# Patient Record
Sex: Female | Born: 2000 | Race: White | Hispanic: No | Smoking: Never smoker
Health system: Southern US, Community
[De-identification: ages and names within clinical notes are randomized; demographics above are authoritative.]

## PROBLEM LIST (undated history)

## (undated) DIAGNOSIS — N83209 Unspecified ovarian cyst, unspecified side: Secondary | ICD-10-CM

## (undated) DIAGNOSIS — E119 Type 2 diabetes mellitus without complications: Secondary | ICD-10-CM

## (undated) DIAGNOSIS — F909 Attention-deficit hyperactivity disorder, unspecified type: Secondary | ICD-10-CM

## (undated) DIAGNOSIS — L509 Urticaria, unspecified: Secondary | ICD-10-CM

## (undated) DIAGNOSIS — T7840XA Allergy, unspecified, initial encounter: Secondary | ICD-10-CM

## (undated) DIAGNOSIS — T783XXA Angioneurotic edema, initial encounter: Secondary | ICD-10-CM

## (undated) DIAGNOSIS — G43909 Migraine, unspecified, not intractable, without status migrainosus: Secondary | ICD-10-CM

## (undated) DIAGNOSIS — J45909 Unspecified asthma, uncomplicated: Secondary | ICD-10-CM

## (undated) HISTORY — DX: Unspecified asthma, uncomplicated: J45.909

## (undated) HISTORY — PX: TONSILLECTOMY: SUR1361

## (undated) HISTORY — DX: Allergy, unspecified, initial encounter: T78.40XA

## (undated) HISTORY — DX: Type 2 diabetes mellitus without complications: E11.9

## (undated) HISTORY — DX: Angioneurotic edema, initial encounter: T78.3XXA

## (undated) HISTORY — DX: Urticaria, unspecified: L50.9

---
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
## 2019-01-26 DIAGNOSIS — F952 Tourette's disorder: Secondary | ICD-10-CM | POA: Insufficient documentation

## 2021-04-26 ENCOUNTER — Other Ambulatory Visit: Payer: Self-pay

## 2021-04-26 ENCOUNTER — Encounter (HOSPITAL_BASED_OUTPATIENT_CLINIC_OR_DEPARTMENT_OTHER): Payer: Self-pay | Admitting: Emergency Medicine

## 2021-04-26 ENCOUNTER — Emergency Department (HOSPITAL_BASED_OUTPATIENT_CLINIC_OR_DEPARTMENT_OTHER): Payer: Medicaid Other

## 2021-04-26 ENCOUNTER — Emergency Department (HOSPITAL_BASED_OUTPATIENT_CLINIC_OR_DEPARTMENT_OTHER): Payer: Medicaid Other | Admitting: Radiology

## 2021-04-26 ENCOUNTER — Emergency Department (HOSPITAL_BASED_OUTPATIENT_CLINIC_OR_DEPARTMENT_OTHER)
Admission: EM | Admit: 2021-04-26 | Discharge: 2021-04-26 | Disposition: A | Discharge status: Home / Self Care | Payer: Medicaid Other | Attending: Emergency Medicine | Admitting: Emergency Medicine

## 2021-04-26 DIAGNOSIS — R109 Unspecified abdominal pain: Secondary | ICD-10-CM | POA: Diagnosis present

## 2021-04-26 DIAGNOSIS — R197 Diarrhea, unspecified: Secondary | ICD-10-CM | POA: Diagnosis not present

## 2021-04-26 DIAGNOSIS — Z20822 Contact with and (suspected) exposure to covid-19: Secondary | ICD-10-CM | POA: Diagnosis not present

## 2021-04-26 DIAGNOSIS — N83292 Other ovarian cyst, left side: Secondary | ICD-10-CM | POA: Insufficient documentation

## 2021-04-26 DIAGNOSIS — N83201 Unspecified ovarian cyst, right side: Secondary | ICD-10-CM

## 2021-04-26 DIAGNOSIS — R102 Pelvic and perineal pain: Secondary | ICD-10-CM

## 2021-04-26 LAB — CBC WITH DIFFERENTIAL/PLATELET
Abs Immature Granulocytes: 0.04 10*3/uL (ref 0.00–0.07)
Basophils Absolute: 0 10*3/uL (ref 0.0–0.1)
Basophils Relative: 0 %
Eosinophils Absolute: 0.3 10*3/uL (ref 0.0–0.5)
Eosinophils Relative: 2 %
HCT: 43.6 % (ref 36.0–46.0)
Hemoglobin: 14.6 g/dL (ref 12.0–15.0)
Immature Granulocytes: 0 %
Lymphocytes Relative: 22 %
Lymphs Abs: 3.6 10*3/uL (ref 0.7–4.0)
MCH: 30.2 pg (ref 26.0–34.0)
MCHC: 33.5 g/dL (ref 30.0–36.0)
MCV: 90.3 fL (ref 80.0–100.0)
Monocytes Absolute: 0.8 10*3/uL (ref 0.1–1.0)
Monocytes Relative: 5 %
Neutro Abs: 11.6 10*3/uL — ABNORMAL HIGH (ref 1.7–7.7)
Neutrophils Relative %: 71 %
Platelets: 412 10*3/uL — ABNORMAL HIGH (ref 150–400)
RBC: 4.83 MIL/uL (ref 3.87–5.11)
RDW: 12.3 % (ref 11.5–15.5)
WBC: 16.3 10*3/uL — ABNORMAL HIGH (ref 4.0–10.5)
nRBC: 0 % (ref 0.0–0.2)

## 2021-04-26 LAB — COMPREHENSIVE METABOLIC PANEL
ALT: 30 U/L (ref 0–44)
AST: 23 U/L (ref 15–41)
Albumin: 4.4 g/dL (ref 3.5–5.0)
Alkaline Phosphatase: 92 U/L (ref 38–126)
Anion gap: 9 (ref 5–15)
BUN: 8 mg/dL (ref 6–20)
CO2: 27 mmol/L (ref 22–32)
Calcium: 9.6 mg/dL (ref 8.9–10.3)
Chloride: 103 mmol/L (ref 98–111)
Creatinine, Ser: 0.46 mg/dL (ref 0.44–1.00)
GFR, Estimated: >60 mL/min (ref >60)
Glucose, Bld: 116 mg/dL — ABNORMAL HIGH (ref 70–99)
Potassium: 3.6 mmol/L (ref 3.5–5.1)
Sodium: 139 mmol/L (ref 135–145)
Total Bilirubin: 0.3 mg/dL (ref 0.3–1.2)
Total Protein: 7.3 g/dL (ref 6.5–8.1)

## 2021-04-26 LAB — URINALYSIS, ROUTINE W REFLEX MICROSCOPIC
Bilirubin Urine: NEGATIVE
Glucose, UA: NEGATIVE mg/dL
Hgb urine dipstick: NEGATIVE
Ketones, ur: NEGATIVE mg/dL
Leukocytes,Ua: NEGATIVE
Nitrite: NEGATIVE
Protein, ur: NEGATIVE mg/dL
Specific Gravity, Urine: 1.008 (ref 1.005–1.030)
pH: 7.5 (ref 5.0–8.0)

## 2021-04-26 LAB — TROPONIN I (HIGH SENSITIVITY)
Troponin I (High Sensitivity): <2 ng/L (ref <18)
Troponin I (High Sensitivity): <2 ng/L (ref <18)

## 2021-04-26 LAB — PREGNANCY, URINE: Preg Test, Ur: NEGATIVE

## 2021-04-26 LAB — LIPASE, BLOOD: Lipase: 24 U/L (ref 11–51)

## 2021-04-26 LAB — RESP PANEL BY RT-PCR (FLU A&B, COVID) ARPGX2
Influenza A by PCR: NEGATIVE
Influenza B by PCR: NEGATIVE
SARS Coronavirus 2 by RT PCR: NEGATIVE

## 2021-04-26 MED ORDER — IOHEXOL 300 MG/ML  SOLN
100.0000 mL | Freq: Once | INTRAMUSCULAR | Status: AC | PRN
  Administered 2021-04-26: 21:00:00 100 mL via INTRAVENOUS

## 2021-04-26 MED ORDER — IOHEXOL 300 MG/ML  SOLN: 100.0000 mL | Freq: Once | INTRAMUSCULAR | Status: DC | PRN

## 2021-04-26 NOTE — ED Notes (Signed)
Patient transported to Ultrasound 

## 2021-04-26 NOTE — Discharge Instructions (Addendum)
As we discussed your work-up today was reassuring including overall normal lab work with a slightly elevated white blood cell count which can have multiple causes as we discussed including stress, infection.  The ultrasound showed a 4 and half centimeter right ovarian benign functional cyst.  They did not recommend any follow-up imaging.  However as we discussed based on the acute pain that you had earlier I have some concern that you may have some intermittent torsion happening and I do recommend that you follow-up with OB/GYN for further evaluation and close monitoring.  Please return to the emergency department if you experience worsening pain, nausea, vomiting, or excessive vaginal bleeding especially when you are not on your menstrual cycle.   It was a pleasure taking care of you today, I hope that you feel better soon.

## 2021-04-26 NOTE — ED Notes (Signed)
Patient back from CT.

## 2021-04-26 NOTE — ED Notes (Signed)
Patient transported to Radiology at this Time. 

## 2021-04-26 NOTE — ED Notes (Signed)
EDP at Bedside 

## 2021-04-26 NOTE — ED Provider Notes (Signed)
[facility]   CSN: [id number] Arrival date & time: 04/26/21  1623     History Chief Complaint  Patient presents with   Diarrhea    Alyssa Bennett is a 20 y.o. female with past medical history significant for anxiety, unspecified heart arrhythmia with tachycardia evaluated by cardiologist in the past, panic attacks who presents with diarrhea, hot and cold flashes, abdominal pain, some congestion.  Patient reports the pain in her abdomen is 6/10, comes and goes.  Patient denies dysuria, hematuria, does endorse some increased urinary frequency.  Patient denies any vaginal discharge, concern for STI.  Patient denies concern for pregnancy, is in a same-sex relationship.  Patient reports last menstrual cycle was normal, within the last 2 weeks, no intermittent bleeding, normal cramping.  Patient does endorse decreased appetite.  Patient also reports that today she had some significant chest tightness, and chest pain without radiation to the left arm, neck.  Patient denies any diaphoresis, lightheadedness.  Patient reports that this sensation is consistent with former panic attacks.  Patient does also endorse asthma, however she has not noted any wheezing, has not had to use her inhaler today.  Pain the abdomen is characterizes sharp.  Nothing seems to make it better, nothing seems to make it worse.   Diarrhea Associated symptoms: abdominal pain       Past Medical History:  Diagnosis Date   ADHD (attention deficit hyperactivity disorder)    Migraine     Patient Active Problem List   Diagnosis Date Noted   Paraspinal muscle spasm 06/02/2017   Back pain, lumbosacral 06/02/2017    Past Surgical History:  Procedure Laterality Date   TONSILLECTOMY       OB History   No obstetric history on file.     Family History  Problem Relation Age of Onset   Migraines Mother    ADD / ADHD Mother    Anxiety disorder Mother    ADD / ADHD Brother     Migraines Maternal Grandmother    Anxiety disorder Maternal Grandmother    Seizures Neg Hx    Autism Neg Hx    Depression Neg Hx    Bipolar disorder Neg Hx    Schizophrenia Neg Hx     Social History   Tobacco Use   Smoking status: Never   Smokeless tobacco: Never  Substance Use Topics   Alcohol use: No   Drug use: No    Home Medications Prior to Admission medications   Medication Sig Start Date End Date Taking? Authorizing Provider  cetirizine-pseudoephedrine (ZYRTEC-D) 5-120 MG tablet Take 1 tablet by mouth daily. 10/30/17   Wurst, GrenadaBrittany, PA-C  ciprofloxacin-dexamethasone (CIPRODEX) OTIC suspension Place 4 drops into the right ear 2 (two) times daily. Patient not taking: Reported on 06/02/2017 12/12/16   Ofilia Neaslark, Michael L, PA-C  ibuprofen (ADVIL,MOTRIN) 600 MG tablet TAKE 1 TABLET BY MOUTH EVERY 6 HOURS AS NEEDED UP TO 10 DAYS 04/13/17   [provider]  lisdexamfetamine (VYVANSE) 30 MG capsule Take 30 mg by mouth daily.    [provider]  methylphenidate (RITALIN) 10 MG tablet Take 10 mg by mouth daily as needed (ADD).  03/29/15   [provider]  metoprolol tartrate (LOPRESSOR) 50 MG tablet Take by mouth. 04/01/16   [provider]  Multiple Vitamins-Minerals (MULTIVITAMIN ADULT PO) Take 2 each by mouth daily. gummies    [provider]  SUMAtriptan (IMITREX) 100 MG tablet TAKE ONE TABLET AT EARLIEST  SIGN OF MIGRAINE HEADACHE. 11/26/15   [provider]    Allergies    Zithromax [azithromycin]  Review of Systems   Review of Systems  Respiratory:  Positive for chest tightness and shortness of breath.   Cardiovascular:  Negative for chest pain.  Gastrointestinal:  Positive for abdominal pain and diarrhea.  All other systems reviewed and are negative.  Physical Exam Updated Vital Signs BP 132/81   Pulse 98   Temp 98.3 F (36.8 C) (Oral)   Resp 14   Ht 5' 0.5" (1.537 m)   Wt 61.2 kg   LMP 03/30/2021 (Approximate)  Comment: irregular; Negative Pregnancy test done 04/26/21  SpO2 100%   BMI 25.92 kg/m   Physical Exam Vitals and nursing note reviewed.  Constitutional:      General: She is not in acute distress.    Appearance: Normal appearance.     Comments: Overall well-appearing patient who is very anxious about being evaluated today  HENT:     Head: Normocephalic and atraumatic.  Eyes:     General:        Right eye: No discharge.        Left eye: No discharge.  Cardiovascular:     Rate and Rhythm: Normal rate and regular rhythm.     Heart sounds: No murmur heard.   No friction rub. No gallop.  Pulmonary:     Effort: Pulmonary effort is normal.     Breath sounds: Normal breath sounds.  Abdominal:     General: Bowel sounds are normal.     Palpations: Abdomen is soft.     Comments: Focal tenderness to palpation in the right lower quadrant without any masses palpated.  There is no rebound, rigidity, guarding noted.  There is no Murphy's sign.  Normal bowel sounds throughout.  Skin:    General: Skin is warm and dry.     Capillary Refill: Capillary refill takes less than 2 seconds.  Neurological:     Mental Status: She is alert and oriented to person, place, and time.  Psychiatric:        Mood and Affect: Mood normal.        Behavior: Behavior normal.    ED Results / Procedures / Treatments   Labs (all labs ordered are listed, but only abnormal results are displayed) Labs Reviewed  CBC WITH DIFFERENTIAL/PLATELET - Abnormal; Notable for the following components:      Result Value   WBC 16.3 (*)    Platelets 412 (*)    Neutro Abs 11.6 (*)    All other components within normal limits  COMPREHENSIVE METABOLIC PANEL - Abnormal; Notable for the following components:   Glucose, Bld 116 (*)    All other components within normal limits  URINALYSIS, ROUTINE W REFLEX MICROSCOPIC - Abnormal; Notable for the following components:   Color, Urine COLORLESS (*)    All other components within  normal limits  RESP PANEL BY RT-PCR (FLU A&B, COVID) ARPGX2  URINE CULTURE  PREGNANCY, URINE  LIPASE, BLOOD  TROPONIN I (HIGH SENSITIVITY)  TROPONIN I (HIGH SENSITIVITY)    EKG None  Radiology DG Chest 2 View  Result Date: 04/26/2021 CLINICAL DATA:  Chest pain and diarrhea today.  Anxiety. EXAM: CHEST - 2 VIEW COMPARISON:  04/23/2007 FINDINGS: Heart size is normal. Mediastinal shadows are normal. The lungs are clear. No bronchial thickening. No infiltrate, mass, effusion or collapse. Pulmonary vascularity is normal. No bony abnormality. IMPRESSION: Normal chest Electronically Signed   By:  Nelson Chimes M.D.   On: 04/26/2021 20:15   CT ABDOMEN PELVIS W CONTRAST  Result Date: 04/26/2021 CLINICAL DATA:  Right lower quadrant pain. EXAM: CT ABDOMEN AND PELVIS WITH CONTRAST TECHNIQUE: Multidetector CT imaging of the abdomen and pelvis was performed using the standard protocol following bolus administration of intravenous contrast. CONTRAST:  150mL OMNIPAQUE IOHEXOL 300 MG/ML  SOLN COMPARISON:  None. FINDINGS: Lower chest: No acute abnormality. Hepatobiliary: 20 cm length moderately steatotic liver without mass enhancement. The gallbladder and bile ducts are unremarkable. Pancreas: Unremarkable. No pancreatic ductal dilatation or surrounding inflammatory changes. Spleen: No mass enhancement or splenomegaly. Adrenals/Urinary Tract: Adrenal glands are unremarkable. Kidneys are normal, without renal calculi, focal lesion, or hydronephrosis. Bladder is unremarkable. Stomach/Bowel: The gastric wall, unopacified small bowel and appendix are unremarkable. There is mild fecal stasis. Left colonic scattered diverticula without diverticulitis. Vascular/Lymphatic: No significant vascular findings are present. No enlarged abdominal or pelvic lymph nodes. Reproductive: There is a 5.4 x 5.4 x 4.3 cm, likely cystic low-density lesion of the right ovary of 27 Hounsfield units above the usual density of simple fluid. It  is probably a hemorrhagic cyst. There is no edema adjacent the right ovary. The left ovary is unremarkable. The uterus is intact and unremarkable. Other: There is trace low-density fluid in the pelvic cul-de-sac and adnexal areas. There is no free air, abscess or hemorrhage. Small umbilical fat hernia. Musculoskeletal: No acute or significant osseous findings. IMPRESSION: 1. 5.4 x 5.4 x 4.3 cm right ovarian low-density lesion above the typical density of fluid, most likely is a hemorrhagic cyst. Since there is no other etiology identified for right lower quadrant pain, pelvic ultrasound should be obtained to confirm preservation of flow to the right ovary. 2. 20 cm in length moderately steatotic liver. No splenomegaly. No portal vein dilatation. 3. Scattered left colonic diverticula without diverticulitis findings. Constipation. 4. Trace low-density fluid in the pelvis, could be physiologic or due to cyst leakage. No free hemorrhage. 5. Umbilical fat hernia. Electronically Signed   By: Telford Nab M.D.   On: 04/26/2021 21:46   US PELVIC COMPLETE W TRANSVAGINAL AND TORSION R/O  Result Date: 04/26/2021 CLINICAL DATA:  Right lower quadrant pain for 2 days, initial encounter EXAM: TRANSABDOMINAL AND TRANSVAGINAL ULTRASOUND OF PELVIS DOPPLER ULTRASOUND OF OVARIES TECHNIQUE: Both transabdominal and transvaginal ultrasound examinations of the pelvis were performed. Transabdominal technique was performed for global imaging of the pelvis including uterus, ovaries, adnexal regions, and pelvic cul-de-sac. It was necessary to proceed with endovaginal exam following the transabdominal exam to visualize the ovaries. Color and duplex Doppler ultrasound was utilized to evaluate blood flow to the ovaries. COMPARISON:  CT from earlier in the same day. FINDINGS: Uterus Measurements: 6.1 x 3.1 x 4.1 cm. = volume: 45 mL. No fibroids or other mass visualized. Endometrium Thickness: 5 mm.  No focal abnormality visualized. Right  ovary Measurements: 5.2 x 5.1 x 4.9 cm. = volume: 68 mL. 4.5 cm simple appearing cyst is noted within the right ovary. This corresponds to that seen on recent CT examination. No mural nodularity is noted. No significant vascularity is noted. No septations are seen. Left ovary Measurements: 3.3 x 2.7 x 2.2 cm. = volume: 10.1 mL. Normal appearance/no adnexal mass. Pulsed Doppler evaluation of both ovaries demonstrates normal low-resistance arterial and venous waveforms. Other findings Mild amount of free pelvic fluid is noted likely physiologic in nature. IMPRESSION: 4.5 cm right ovarian benign functional cyst. No follow-up imaging is recommended. Reference: Radiology 2019 Nov;293(2):359-371  No other focal abnormality is noted. Electronically Signed   By: Alcide Clever M.D.   On: 04/26/2021 22:53    Procedures Procedures   Medications Ordered in ED Medications  iohexol (OMNIPAQUE) 300 MG/ML solution 100 mL (100 mLs Intravenous Contrast Given 04/26/21 2121)    ED Course  I have reviewed the triage vital signs and the nursing notes.  Pertinent labs & imaging results that were available during my care of the patient were reviewed by me and considered in my medical decision making (see chart for details).    MDM Rules/Calculators/A&P                         I discussed this case with my attending physician who cosigned this note including patient's presenting symptoms, physical exam, and planned diagnostics and interventions. Attending physician stated agreement with plan or made changes to plan which were implemented.   Attending physician assessed patient at bedside.  Overall well-appearing patient with story concerning for possible lower abdominal etiologies including ovarian torsion, appendicitis, diverticulitis, gastroenteritis, urinary tract infection, nephrolithiasis, pyelonephritis.  Patient with chest tightness, pain consistent with panic attack, however with some concern for cardiac  abnormality, with strong family history of cardiac disease.  Patient without history of former ACS, diabetes, does not smoke.  Given the large differential diagnosis for Alyssa Bennett, the decision making in this case is of high complexity.  After evaluating all of the data points in this case, the presentation of Alyssa Bennett is NOT consistent with Acute Coronary Syndrome (ACS) and/or myocardial ischemia, pulmonary embolism, aortic dissection; Alyssa Bennett, significant arrythmia, pneumothorax, cardiac tamponade, or other emergent cardiopulmonary condition.  Further, the presentation of Alyssa Bennett is NOT consistent with pericarditis, myocarditis, cholecystitis, pancreatitis, mediastinitis, endocarditis, new valvular disease.  Additionally, the presentation of Alyssa Bennett is NOT consistent with flail chest, cardiac contusion, ARDS, or significant intra-thoracic or intra-abdominal bleeding.  Moreover, this presentation is NOT consistent with pneumonia, sepsis, or pyelonephritis.  The patient has some chest tightness consistent with her normal panic attacks, improved with reassurance of normal work-up today including negative troponin x2, nonischemic EKG which does show consistency with her known arrhythmia, and normal chest x-ray.  Patient with no additional concern for cardiac, pulmonary chest pain at this time.  In regards to her abdominal pain patient does have a focal right lower quadrant tenderness.  Her initial screening lab work is significant only for a fairly elevated white count of 16.3, with some neutrophil predominance, slightly elevated platelets potentially representing acute phase reactant.  Minimally elevated glucose of 116, with no other abnormalities including electrolytes, clear urinalysis, negative lipase.  This patient does have some focal right lower quadrant tenderness I do recommend a CT abdomen pelvis at this time to rule out appendicitis especially in context of  elevated white blood cell count.  CT abdomen pelvis reveals a approximately 5 cm probably hemorrhagic cyst in the right lower quadrant with some free fluid potentially representing possible minimal rupture versus physiologic free fluid.  Recommendation on CT is an ultrasound to rule out torsion.  Ultrasound shows no evidence of torsion with good blood flow to both ovaries, and a normal physiologic cyst.  They recommended no further follow-up.  In context of patient's severe pain today that was resolved without any intervention pain medication I have some suspicion that patient may be having intermittent ovarian torsion.  I do recommend that she follow-up with an OB/GYN for further  evaluation.   Is pain-free at this time, with minimal focal tenderness in the right lower quadrant still.  Patient remained stable, stable vital signs.  Patient agrees with plan to follow-up with OB/GYN, and is otherwise reassured.  Patient discharged in stable condition at this time  Strict return and follow-up precautions have been given by me personally or by detailed written instruction given verbally by nursing staff using the teach back method to the patient/family/caregiver(s).  Data Reviewed/Counseling: I have reviewed the patient's vital signs, nursing notes, and other relevant tests/information. I had a detailed discussion regarding the historical points, exam findings, and any diagnostic results supporting the discharge diagnosis. I also discussed the need for outpatient follow-up and the need to return to the ED if symptoms worsen or if there are any questions or concerns that arise at home.  Final Clinical Impression(s) / ED Diagnoses Final diagnoses:  Adnexal pain  Cyst of right ovary    Rx / DC Orders ED Discharge Orders     None        Dorien Chihuahua 04/26/21 2304    Charlesetta Shanks, MD 05/01/21 1550

## 2021-04-26 NOTE — ED Notes (Signed)
RN provided AVS using Teachback Method. Patient verbalizes understanding of Discharge Instructions. Opportunity for Questioning and Answers were provided by RN. Patient Discharged from ED ambulatory to Home with Family. ? ?

## 2021-04-26 NOTE — ED Triage Notes (Signed)
Pt reports diarrhea along with hot and cold flashes starting today.  Pt denies cough, states congestion similar to normal allergies.

## 2021-04-28 LAB — URINE CULTURE: Culture: <10000 — AB

## 2021-10-14 ENCOUNTER — Other Ambulatory Visit: Payer: Self-pay

## 2021-10-14 ENCOUNTER — Encounter (HOSPITAL_BASED_OUTPATIENT_CLINIC_OR_DEPARTMENT_OTHER): Payer: Self-pay | Admitting: Radiology

## 2021-10-14 ENCOUNTER — Emergency Department (HOSPITAL_BASED_OUTPATIENT_CLINIC_OR_DEPARTMENT_OTHER): Payer: Medicaid Other

## 2021-10-14 ENCOUNTER — Emergency Department (HOSPITAL_BASED_OUTPATIENT_CLINIC_OR_DEPARTMENT_OTHER)
Admission: EM | Admit: 2021-10-14 | Discharge: 2021-10-14 | Disposition: A | Discharge status: Home / Self Care | Payer: Medicaid Other | Attending: Emergency Medicine | Admitting: Emergency Medicine

## 2021-10-14 DIAGNOSIS — R739 Hyperglycemia, unspecified: Secondary | ICD-10-CM | POA: Insufficient documentation

## 2021-10-14 DIAGNOSIS — R1031 Right lower quadrant pain: Secondary | ICD-10-CM

## 2021-10-14 DIAGNOSIS — N3001 Acute cystitis with hematuria: Secondary | ICD-10-CM | POA: Insufficient documentation

## 2021-10-14 DIAGNOSIS — K760 Fatty (change of) liver, not elsewhere classified: Secondary | ICD-10-CM | POA: Diagnosis not present

## 2021-10-14 DIAGNOSIS — D72829 Elevated white blood cell count, unspecified: Secondary | ICD-10-CM | POA: Diagnosis not present

## 2021-10-14 LAB — URINALYSIS, ROUTINE W REFLEX MICROSCOPIC
Bilirubin Urine: NEGATIVE
Glucose, UA: NEGATIVE mg/dL
Ketones, ur: NEGATIVE mg/dL
Nitrite: NEGATIVE
Protein, ur: 30 mg/dL — AB
Specific Gravity, Urine: 1.007 (ref 1.005–1.030)
WBC, UA: >50 WBC/hpf — ABNORMAL HIGH (ref 0–5)
pH: 6 (ref 5.0–8.0)

## 2021-10-14 LAB — COMPREHENSIVE METABOLIC PANEL
ALT: 21 U/L (ref 0–44)
AST: 16 U/L (ref 15–41)
Albumin: 4 g/dL (ref 3.5–5.0)
Alkaline Phosphatase: 76 U/L (ref 38–126)
Anion gap: 10 (ref 5–15)
BUN: 15 mg/dL (ref 6–20)
CO2: 26 mmol/L (ref 22–32)
Calcium: 9.2 mg/dL (ref 8.9–10.3)
Chloride: 103 mmol/L (ref 98–111)
Creatinine, Ser: 0.67 mg/dL (ref 0.44–1.00)
GFR, Estimated: >60 mL/min (ref >60)
Glucose, Bld: 107 mg/dL — ABNORMAL HIGH (ref 70–99)
Potassium: 3.7 mmol/L (ref 3.5–5.1)
Sodium: 139 mmol/L (ref 135–145)
Total Bilirubin: 0.3 mg/dL (ref 0.3–1.2)
Total Protein: 7.3 g/dL (ref 6.5–8.1)

## 2021-10-14 LAB — CBC WITH DIFFERENTIAL/PLATELET
Abs Immature Granulocytes: 0.05 10*3/uL (ref 0.00–0.07)
Basophils Absolute: 0.1 10*3/uL (ref 0.0–0.1)
Basophils Relative: 0 %
Eosinophils Absolute: 0.2 10*3/uL (ref 0.0–0.5)
Eosinophils Relative: 1 %
HCT: 40.6 % (ref 36.0–46.0)
Hemoglobin: 13.6 g/dL (ref 12.0–15.0)
Immature Granulocytes: 0 %
Lymphocytes Relative: 17 %
Lymphs Abs: 2.8 10*3/uL (ref 0.7–4.0)
MCH: 30.4 pg (ref 26.0–34.0)
MCHC: 33.5 g/dL (ref 30.0–36.0)
MCV: 90.6 fL (ref 80.0–100.0)
Monocytes Absolute: 0.9 10*3/uL (ref 0.1–1.0)
Monocytes Relative: 5 %
Neutro Abs: 12.6 10*3/uL — ABNORMAL HIGH (ref 1.7–7.7)
Neutrophils Relative %: 77 %
Platelets: 371 10*3/uL (ref 150–400)
RBC: 4.48 MIL/uL (ref 3.87–5.11)
RDW: 12.1 % (ref 11.5–15.5)
WBC: 16.6 10*3/uL — ABNORMAL HIGH (ref 4.0–10.5)
nRBC: 0 % (ref 0.0–0.2)

## 2021-10-14 LAB — WET PREP, GENITAL
Clue Cells Wet Prep HPF POC: NONE SEEN
Sperm: NONE SEEN
Trich, Wet Prep: NONE SEEN
WBC, Wet Prep HPF POC: >=10 — AB (ref <10)
Yeast Wet Prep HPF POC: NONE SEEN

## 2021-10-14 LAB — LIPASE, BLOOD: Lipase: 24 U/L (ref 11–51)

## 2021-10-14 LAB — HCG, QUANTITATIVE, PREGNANCY: hCG, Beta Chain, Quant, S: <1 m[IU]/mL (ref <5)

## 2021-10-14 MED ORDER — IBUPROFEN 600 MG PO TABS: 600.0000 mg | ORAL_TABLET | Freq: Four times a day (QID) | ORAL | 0 refills | Status: DC | PRN

## 2021-10-14 MED ORDER — NITROFURANTOIN MONOHYD MACRO 100 MG PO CAPS: 100.0000 mg | ORAL_CAPSULE | Freq: Two times a day (BID) | ORAL | 0 refills | Status: DC

## 2021-10-14 MED ORDER — IBUPROFEN 400 MG PO TABS
600.0000 mg | ORAL_TABLET | Freq: Once | ORAL | Status: AC
  Administered 2021-10-14: 600 mg via ORAL
  Filled 2021-10-14: qty 1

## 2021-10-14 MED ORDER — IOHEXOL 300 MG/ML  SOLN
100.0000 mL | Freq: Once | INTRAMUSCULAR | Status: AC | PRN
  Administered 2021-10-14: 75 mL via INTRAVENOUS

## 2021-10-14 MED ORDER — ONDANSETRON HCL 4 MG PO TABS: 4.0000 mg | ORAL_TABLET | Freq: Four times a day (QID) | ORAL | 0 refills | Status: DC

## 2021-10-14 MED ORDER — NITROFURANTOIN MONOHYD MACRO 100 MG PO CAPS
100.0000 mg | ORAL_CAPSULE | Freq: Once | ORAL | Status: AC
  Administered 2021-10-14: 100 mg via ORAL
  Filled 2021-10-14: qty 1

## 2021-10-14 MED ORDER — ONDANSETRON HCL 4 MG/2ML IJ SOLN
4.0000 mg | Freq: Once | INTRAMUSCULAR | Status: AC
  Administered 2021-10-14: 4 mg via INTRAVENOUS
  Filled 2021-10-14: qty 2

## 2021-10-14 NOTE — ED Triage Notes (Signed)
Pt arrived POV, c/o intermittent right sided lower abd pain x 5 months. Pt reports the she was diagnosed previously with "mild intermittent ovarian torsion". Also endorses history of dysmenorrhea and period started today. OB appt on 11/02/2021. Denies abdominal surgeries in the past.

## 2021-10-14 NOTE — Discharge Instructions (Addendum)
You were seen in the emergency department for evaluation of your right lower quadrant pain.  Your imaging was unremarkable.  Your labs suggested that you may have a urinary tract infection.  For this, I am prescribing you an antibiotic called Macrobid or nitrofurantoin.  You were given your first dose tonight.  Please continue taking this twice daily with your next dose being in the morning.  Additionally, I prescribed you ibuprofen 600 mg to take every 6 hours as needed for pain.  Additionally, I prescribed you Zofran to take as needed for your chronic nausea.  If you have any worsening abdominal pain, fever, blood in your urine, black stools, or any other new or worsening symptoms, or any concern, please return to the nearest emergency department for reevaluation.  Contact a doctor if: Your belly pain changes or gets worse. You are not hungry, or you lose weight without trying. You are having trouble pooping (constipated) or have watery poop (diarrhea) for more than 2-3 days. You have pain when you pee or poop. Your belly pain wakes you up at night. Your pain gets worse with meals, after eating, or with certain foods. You are vomiting and cannot keep anything down. You have a fever. You have blood in your pee. Get help right away if: Your pain does not go away as soon as your doctor says it should. You cannot stop vomiting. Your pain is only in areas of your belly, such as the right side or the left lower part of the belly. You have bloody or black poop, or poop that looks like tar. You have very bad pain, cramping, or bloating in your belly. You have signs of not having enough fluid or water in your body (dehydration), such as: Dark pee, very little pee, or no pee. Cracked lips. Dry mouth. Sunken eyes. Sleepiness. Weakness. You have trouble breathing or chest pain.

## 2021-10-14 NOTE — ED Notes (Signed)
Lab notified of urine culture add on 

## 2021-10-14 NOTE — ED Provider Notes (Signed)
[facility] EMERGENCY DEPT Provider Note   CSN: [id number] Arrival date & time: 10/14/21  1401     History Chief Complaint  Patient presents with   Abdominal Pain    Alyssa Bennett is a 21 y.o. female ADHD, and chronic urticaria area presents the emergency department for right lower quadrant pain for the past few months worsening over the past 2 to 3 days.  She reports that she was seen here late last year and was diagnosed with "intermittent ovarian torsion".  She was concerned it was worse again or her menstrual cramping as she did start her cycle today.  She denies any dysuria, hematuria, vaginal discharge, back pain, fever, chest pain, shortness of breath, melena, hematochezia, diarrhea, or constipation.  She reports that she has had some possible urinary frequency and urgency.  She reported that she had some nausea without any vomiting although the nausea is chronic for her.  She reported the pain was worse today and felt cramping on her right lower quadrant.  No medications trialed prior to arrival.  She is allergic to azithromycin.  Denies any tobacco, EtOH, or illicit drug use ever.  She reports that she has an appointment with a PCP in June to be sent to gynecologist for further evaluation.  Abdominal Pain Associated symptoms: nausea   Associated symptoms: no chest pain, no chills, no constipation, no diarrhea, no dysuria, no fever, no shortness of breath, no vaginal discharge and no vomiting       Home Medications Prior to Admission medications   Medication Sig Start Date End Date Taking? Authorizing Provider  ibuprofen (ADVIL) 600 MG tablet Take 1 tablet (600 mg total) by mouth every 6 (six) hours as needed. 10/14/21  Yes Sherrell Puller, PA-C  nitrofurantoin, macrocrystal-monohydrate, (MACROBID) 100 MG capsule Take 1 capsule (100 mg total) by mouth 2 (two) times daily. 10/14/21  Yes Sherrell Puller, PA-C  ondansetron (ZOFRAN) 4 MG tablet Take 1 tablet (4 mg total) by  mouth every 6 (six) hours. 10/14/21  Yes Sherrell Puller, PA-C  cetirizine-pseudoephedrine (ZYRTEC-D) 5-120 MG tablet Take 1 tablet by mouth daily. 10/30/17   Wurst, [place], PA-C  ciprofloxacin-dexamethasone (CIPRODEX) OTIC suspension Place 4 drops into the right ear 2 (two) times daily. Patient not taking: Reported on 06/02/2017 12/12/16   Tereasa Coop, PA-C  lisdexamfetamine (VYVANSE) 30 MG capsule Take 30 mg by mouth daily.    [provider]  methylphenidate (RITALIN) 10 MG tablet Take 10 mg by mouth daily as needed (ADD).  03/29/15   [provider]  metoprolol tartrate (LOPRESSOR) 50 MG tablet Take by mouth. 04/01/16   [provider]  Multiple Vitamins-Minerals (MULTIVITAMIN ADULT PO) Take 2 each by mouth daily. gummies    [provider]  SUMAtriptan (IMITREX) 100 MG tablet TAKE ONE TABLET AT EARLIEST SIGN OF MIGRAINE HEADACHE. 11/26/15   [provider]      Allergies    Zithromax [azithromycin]    Review of Systems   Review of Systems  Constitutional:  Negative for chills and fever.  Respiratory:  Negative for shortness of breath.   Cardiovascular:  Negative for chest pain.  Gastrointestinal:  Positive for abdominal pain and nausea. Negative for anal bleeding, blood in stool, constipation, diarrhea and vomiting.  Genitourinary:  Positive for frequency and urgency. Negative for dysuria, pelvic pain, vaginal discharge and vaginal pain.  Musculoskeletal:  Negative for back pain, myalgias and neck pain.  Neurological:  Negative for weakness and headaches.   Physical  Exam Updated Vital Signs BP 104/89   Pulse 84   Temp 98.9 F (37.2 C)   Resp 16   Ht 5\' 1"  (1.549 m)   Wt 61.2 kg   LMP 10/14/2021 (Exact Date)   SpO2 99%   BMI 25.49 kg/m  Physical Exam Vitals and nursing note reviewed. Exam conducted with a chaperone present Loma Sousa, Therapist, sports).  Constitutional:      General: She is not in acute distress.    Appearance: Normal appearance.  She is not ill-appearing or toxic-appearing.  HENT:     Head: Normocephalic and atraumatic.  Eyes:     General: No scleral icterus. Cardiovascular:     Rate and Rhythm: Normal rate and regular rhythm.  Pulmonary:     Effort: Pulmonary effort is normal. No respiratory distress.     Breath sounds: Normal breath sounds.  Abdominal:     General: Abdomen is flat. Bowel sounds are normal.     Palpations: Abdomen is soft.     Comments: Right lower quadrant tenderness palpation without any guarding or rebound.  No overlying skin changes noted.  Normal active bowel sounds.  Abdomen is soft.  Genitourinary:    Vagina: Normal.     Cervix: No cervical motion tenderness.     Comments: Some bleeding from the cervical os present, patient is currently on her menstrual cycle.  Musculoskeletal:        General: No deformity.     Cervical back: Normal range of motion.  Skin:    General: Skin is warm and dry.  Neurological:     General: No focal deficit present.     Mental Status: She is alert. Mental status is at baseline.    ED Results / Procedures / Treatments   Labs (all labs ordered are listed, but only abnormal results are displayed) Labs Reviewed  WET PREP, GENITAL - Abnormal; Notable for the following components:      Result Value   WBC, Wet Prep HPF POC >=10 (*)    All other components within normal limits  URINE CULTURE - Abnormal; Notable for the following components:   Culture   (*)    Value: <10,000 COLONIES/mL INSIGNIFICANT GROWTH Performed at [facility], [address] [place], [place] [address]    All other components within normal limits  CBC WITH DIFFERENTIAL/PLATELET - Abnormal; Notable for the following components:   WBC 16.6 (*)    Neutro Abs 12.6 (*)    All other components within normal limits  COMPREHENSIVE METABOLIC PANEL - Abnormal; Notable for the following components:   Glucose, Bld 107 (*)    All other components within normal limits  URINALYSIS,  ROUTINE W REFLEX MICROSCOPIC - Abnormal; Notable for the following components:   Color, Urine BROWN (*)    APPearance HAZY (*)    Hgb urine dipstick LARGE (*)    Protein, ur 30 (*)    Leukocytes,Ua MODERATE (*)    WBC, UA >50 (*)    Bacteria, UA RARE (*)    Non Squamous Epithelial 0-5 (*)    All other components within normal limits  HCG, QUANTITATIVE, PREGNANCY  LIPASE, BLOOD  GC/CHLAMYDIA PROBE AMP () NOT AT [facility]    EKG None  Radiology CT ABDOMEN PELVIS W CONTRAST  Result Date: 10/14/2021 CLINICAL DATA:  Right lower quadrant abdominal pain intermittently for 5 months. EXAM: CT ABDOMEN AND PELVIS WITH CONTRAST TECHNIQUE: Multidetector CT imaging of the abdomen and pelvis was performed using the standard  protocol following bolus administration of intravenous contrast. RADIATION DOSE REDUCTION: This exam was performed according to the departmental dose-optimization program which includes automated exposure control, adjustment of the mA and/or kV according to patient size and/or use of iterative reconstruction technique. CONTRAST:  27mL OMNIPAQUE IOHEXOL 300 MG/ML  SOLN COMPARISON:  04/26/2021 FINDINGS: Lower chest: Lung bases are clear. Hepatobiliary: No focal liver abnormality is seen. No gallstones, gallbladder wall thickening, or biliary dilatation. Mild diffuse fatty infiltration of the liver. Pancreas: Unremarkable. No pancreatic ductal dilatation or surrounding inflammatory changes. Spleen: Normal in size without focal abnormality. Adrenals/Urinary Tract: Adrenal glands are unremarkable. Kidneys are normal, without renal calculi, focal lesion, or hydronephrosis. Bladder is unremarkable. Stomach/Bowel: Stomach is within normal limits. Appendix appears normal. No evidence of bowel wall thickening, distention, or inflammatory changes. Vascular/Lymphatic: No significant vascular findings are present. No enlarged abdominal or pelvic lymph nodes. Reproductive: Uterus and bilateral  adnexa are unremarkable. Other: No abdominal wall hernia or abnormality. No abdominopelvic ascites. Musculoskeletal: No acute or significant osseous findings. IMPRESSION: 1. Mild diffuse fatty infiltration of the liver. 2. No acute process demonstrated in the abdomen or pelvis. No evidence of bowel obstruction or inflammation. Appendix is normal. Electronically Signed   By: Lucienne Capers M.D.   On: 10/14/2021 17:38   US PELVIC COMPLETE W TRANSVAGINAL AND TORSION R/O  Result Date: 10/14/2021 CLINICAL DATA:  Right lower quadrant pain. EXAM: TRANSABDOMINAL AND TRANSVAGINAL ULTRASOUND OF PELVIS DOPPLER ULTRASOUND OF OVARIES TECHNIQUE: Both transabdominal and transvaginal ultrasound examinations of the pelvis were performed. Transabdominal technique was performed for global imaging of the pelvis including uterus, ovaries, adnexal regions, and pelvic cul-de-sac. It was necessary to proceed with endovaginal exam following the transabdominal exam to visualize the uterus, endometrium, bilateral adnexa and bilateral ovaries. Color and duplex Doppler ultrasound was utilized to evaluate blood flow to the ovaries. COMPARISON:  April 26, 2021 FINDINGS: Uterus Measurements: 7.0 cm x 3.3 cm x 4.3 cm = volume: 51.6 mL. No fibroids or other mass visualized. Endometrium Thickness: 2.9 mm.  No focal abnormality visualized. Right ovary Measurements: 3.3 cm x 1.7 cm x 3.1 cm = volume: 9.3 mL. Normal appearance/no adnexal mass. Left ovary Measurements: 3.3 cm x 2.9 cm x 2.8 cm = volume: 14.5 mL. Normal appearance/no adnexal mass. Pulsed Doppler evaluation of both ovaries demonstrates normal low-resistance arterial and venous waveforms. Other findings A trace amount of pelvic free fluid is noted, likely physiologic. IMPRESSION: Unremarkable pelvic ultrasound. Electronically Signed   By: Virgina Norfolk M.D.   On: 10/14/2021 20:49     Procedures Procedures   Medications Ordered in ED Medications  ondansetron (ZOFRAN)  injection 4 mg (4 mg Intravenous Given 10/14/21 1656)  iohexol (OMNIPAQUE) 300 MG/ML solution 100 mL (75 mLs Intravenous Contrast Given 10/14/21 1725)  nitrofurantoin (macrocrystal-monohydrate) (MACROBID) capsule 100 mg (100 mg Oral Given 10/14/21 2153)  ibuprofen (ADVIL) tablet 600 mg (600 mg Oral Given 10/14/21 2152)    ED Course/ Medical Decision Making/ A&P                           Medical Decision Making Amount and/or Complexity of Data Reviewed Labs: ordered. Radiology: ordered.  Risk Prescription drug management.   21 year old female presents emergency department for evaluation of right lower quadrant pain that is been intermittent for the past few months although worsening for the past 2 to 3 days.  Differential diagnosis includes but is not limited to appendicitis, ovarian torsion, ovarian cyst, UTI,  diverticulitis, colitis, gas, viral illness, constipation, PID.  Vital signs are unremarkable.  Patient normotensive, afebrile, normal pulse rate, satting well on room air without any increased work of breathing.  Physical exam is pertinent for a well-appearing patient in no acute distress.  Regular rate and rhythm, clear to auscultation bilaterally.  Her abdomen is soft with mild tenderness over the right lower quadrant to deep palpation.  She has normal active bowel sounds with no overlying skin changes noted.  Vaginal exam showed some mild bleeding noted from the cervical os although patient is on her menstrual cycle.  No CMT tenderness.  No lesions or swelling noted.  Patient denies anything for pain at this time, but would like something for nausea.  Zofran ordered.  On chart investigation, the patient was seen here in December 2022.  Her ultrasound then showed a 4.5 cm right ovarian benign functional cyst with no follow-up imaging recommended.  The CT scan at that time showed possible hemorrhagic cyst in the right lower quadrant with some free fluid potentially representing a possible  minimal rupture versus physiological free fluid.  The patient had resolution of pain without any intervention so it was question if she had intermittent ovarian torsion.  Cannot rule out appendicitis so will order CT abdomen pelvis with contrast.  I independently reviewed and interpreted the patient's labs.  CMP shows mildly elevated glucose at 107 although this is not a fasting lab.  No LFT or electrolyte derangement otherwise.  CBC shows elevated white blood cell count at 16.6 but left shifted.  Normal hemoglobin.  Urinalysis shows brown urine with that is hazy with large amount of hemoglobin and moderate leukocytes.  There is some protein without any ketones.  She has greater than 50 white blood cells with rare bacteria seen and white blood cell clumps are present.  Lipase is 24, hCG negative, wet prep shows greater than 10 white blood cells without any yeast, clue cells, trichomonas, or sperm visualized.  GC pending.  Urine culture pending.  CT abdomen pelvis with contrast shows Mild diffuse fatty infiltration of the liver. No acute process demonstrated in the abdomen or pelvis. No evidence of bowel obstruction or inflammation. Appendix is normal.  Given normal CT imaging, will obtain ultrasound to rule out any torsion as well.  Ultrasound shows an unremarkable pelvic ultrasound.  Doubt PID as the patient does not have any CMT tenderness and has a reassuring pelvic exam.  Doubt any appendicitis given reassuring imaging finding.  Doubt any ovarian torsion or ovarian cyst given normal ultrasounds.  Doubt any colitis or diverticulitis given CT.  UTI possible given the patient's white blood cells although rare bacteria seen.  Will empirically place patient on Macrobid for treatment.  Unsure of the patient's elevated white blood cell count at 16.6, could be acute phase reaction.  I have ruled out any acute abdominal process.  This possibly could be UTI versus menstrual cramps.  I discussed the lab and  imaging findings with the patient and partner at bedside.  We discussed possible treatment of UTI with Macrobid twice daily for the next 5 days.  Patiently, will prescribe the patient some Zofran for chronic nausea as well antimicrobial 6 oh milligrams to take as needed for pain.  We discussed strict return precautions and red flag symptoms.  Patient and partner verbalized understanding and agreed to plan.  The patient is stable and being discharged home in good condition.  I discussed this case with my attending physician who cosigned  this note including patient's presenting symptoms, physical exam, and planned diagnostics and interventions. Attending physician stated agreement with plan or made changes to plan which were implemented.   Final Clinical Impression(s) / ED Diagnoses Final diagnoses:  Acute cystitis with hematuria  RLQ abdominal pain    Rx / DC Orders ED Discharge Orders          Ordered    nitrofurantoin, macrocrystal-monohydrate, (MACROBID) 100 MG capsule  2 times daily        10/14/21 2132    ondansetron (ZOFRAN) 4 MG tablet  Every 6 hours        10/14/21 2132    ibuprofen (ADVIL) 600 MG tablet  Every 6 hours PRN        10/14/21 2132              Sherrell Puller, PA-C 10/17/21 2328    Tegeler, Gwenyth Allegra, MD 10/18/21 [phone]

## 2021-10-14 NOTE — ED Notes (Signed)
Pelvic cart at bedside. 

## 2021-10-15 LAB — GC/CHLAMYDIA PROBE AMP (~~LOC~~) NOT AT ARMC
Chlamydia: NEGATIVE
Comment: NEGATIVE
Comment: NORMAL
Neisseria Gonorrhea: NEGATIVE

## 2021-10-16 LAB — URINE CULTURE: Culture: <10000 — AB

## 2021-11-02 ENCOUNTER — Ambulatory Visit (HOSPITAL_BASED_OUTPATIENT_CLINIC_OR_DEPARTMENT_OTHER): Payer: Medicaid Other | Admitting: Nurse Practitioner

## 2021-11-02 ENCOUNTER — Encounter (HOSPITAL_BASED_OUTPATIENT_CLINIC_OR_DEPARTMENT_OTHER): Payer: Self-pay | Admitting: Nurse Practitioner

## 2021-11-02 VITALS — BP 128/88 | HR 81 | Ht 60.0 in | Wt 185.0 lb

## 2021-11-02 DIAGNOSIS — G4485 Primary stabbing headache: Secondary | ICD-10-CM | POA: Diagnosis not present

## 2021-11-02 DIAGNOSIS — F988 Other specified behavioral and emotional disorders with onset usually occurring in childhood and adolescence: Secondary | ICD-10-CM

## 2021-11-02 DIAGNOSIS — N926 Irregular menstruation, unspecified: Secondary | ICD-10-CM

## 2021-11-02 DIAGNOSIS — K219 Gastro-esophageal reflux disease without esophagitis: Secondary | ICD-10-CM

## 2021-11-02 DIAGNOSIS — R110 Nausea: Secondary | ICD-10-CM

## 2021-11-02 DIAGNOSIS — Z130 Encounter for screening for diseases of the blood and blood-forming organs and certain disorders involving the immune mechanism: Secondary | ICD-10-CM

## 2021-11-02 DIAGNOSIS — F411 Generalized anxiety disorder: Secondary | ICD-10-CM

## 2021-11-02 DIAGNOSIS — L503 Dermatographic urticaria: Secondary | ICD-10-CM

## 2021-11-02 DIAGNOSIS — Z13228 Encounter for screening for other metabolic disorders: Secondary | ICD-10-CM

## 2021-11-02 DIAGNOSIS — K5909 Other constipation: Secondary | ICD-10-CM

## 2021-11-02 DIAGNOSIS — Z1321 Encounter for screening for nutritional disorder: Secondary | ICD-10-CM

## 2021-11-02 DIAGNOSIS — Z1329 Encounter for screening for other suspected endocrine disorder: Secondary | ICD-10-CM | POA: Diagnosis not present

## 2021-11-02 DIAGNOSIS — F419 Anxiety disorder, unspecified: Secondary | ICD-10-CM

## 2021-11-02 DIAGNOSIS — E559 Vitamin D deficiency, unspecified: Secondary | ICD-10-CM

## 2021-11-02 DIAGNOSIS — F952 Tourette's disorder: Secondary | ICD-10-CM

## 2021-11-02 MED ORDER — LEVOCETIRIZINE DIHYDROCHLORIDE 5 MG PO TABS: 5.0000 mg | ORAL_TABLET | Freq: Every evening | ORAL | 5 refills | Status: DC

## 2021-11-02 MED ORDER — ONDANSETRON HCL 8 MG PO TABS
8.0000 mg | ORAL_TABLET | Freq: Three times a day (TID) | ORAL | 2 refills | Status: DC | PRN
Start: 2021-11-02 — End: 2023-02-08

## 2021-11-02 MED ORDER — RIZATRIPTAN BENZOATE 10 MG PO TABS: 10.0000 mg | ORAL_TABLET | ORAL | 3 refills | Status: DC | PRN

## 2021-11-02 MED ORDER — ESOMEPRAZOLE MAGNESIUM 40 MG PO CPDR: DELAYED_RELEASE_CAPSULE | ORAL | 3 refills | Status: DC

## 2021-11-02 NOTE — Progress Notes (Incomplete)
Orma Render, DNP, AGNP-c Primary Care & Sports Medicine [address]  [place], [place] [address] [phone]  New patient visit   Patient: Alyssa Bennett   DOB: 2000-08-15   21 y.o. Female  MRN: [id number] Visit Date: 11/02/2021  Patient Care Team: [place], [place] At as PCP - General (Family Medicine)  Today's Vitals   11/02/21 1032  BP: 128/88  Pulse: 81  SpO2: 96%  Weight: 185 lb (83.9 kg)  Height: 5' (1.524 m)   Body mass index is 36.13 kg/m.   Today's healthcare provider: Orma Render, NP   Chief Complaint  Patient presents with  . New Patient (Initial Visit)    Establish care. Went to ED regarding cyst on ovary. Interested PAP next visit.  Allergy is not working Discuss adhd meds   Subjective    Alyssa Bennett is a 21 y.o. female who presents today as a new patient to establish care.    Patient endorses the following concerns presently: Recent cyst - periods irregular - sometimes don't bleed, but "go through the motions" , sometimes bleed but "don't go through the motions" Always have been this way No severe acne, no abnormal hair growth  WBC very elevated on recent ED visit  Nausea daily Chronic constipation She did see GI when she was "really little" At it's worst 2-3 days having a bowel movement Seems a little better because she increased fiber Either constipation or diarrhea Has had a small amount of blood related to fissure Having cramping and pain   She also has acid reflux She tries to keep tums on her at all times  She also reports she has been feeling a little short of breath  Stabbing sensation in the back of the head- not migraine- happening frequently- times them when they happen- Panic attacks Suspected eating disorder ADHD- vyvanse 30mg  and ritalin 10mg - in the past- has been off since 21 years old- it's been worse Tics- worse when she is around other people- feel like a  sneeze and hiccup combined "everywhere"  Itching on hands and feet/ swelling with heat Can't tolerate cold for long  History reviewed and reveals the following: Past Medical History:  Diagnosis Date  . ADHD (attention deficit hyperactivity disorder)   . Migraine    Past Surgical History:  Procedure Laterality Date  . TONSILLECTOMY     Family Status  Relation Name Status  . Mother  Alive  . Father  Alive  . Sister 92 Alive  . Brother 21 Alive  . MGM  Alive  . MGF  Alive  . PGM  Alive  . PGF  Alive  . Neg Hx  (Not Specified)   Family History  Problem Relation Age of Onset  . Migraines Mother   . ADD / ADHD Mother   . Anxiety disorder Mother   . ADD / ADHD Brother   . Migraines Maternal Grandmother   . Anxiety disorder Maternal Grandmother   . Seizures Neg Hx   . Autism Neg Hx   . Depression Neg Hx   . Bipolar disorder Neg Hx   . Schizophrenia Neg Hx    Social History   Socioeconomic History  . Marital status: Single    Spouse name: Not on file  . Number of children: Not on file  . Years of education: Not on file  . Highest education level: Not on file  Occupational History  . Not on file  Tobacco Use  . Smoking status: Never  . Smokeless tobacco: Never  Substance and Sexual Activity  . Alcohol use: No  . Drug use: No  . Sexual activity: Never  Other Topics Concern  . 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 She enjoys music, science and hanging with friends and family   Social Determinants of Health   Financial Resource Strain: Not on file  Food Insecurity: Not on file  Transportation Needs: Not on file  Physical Activity: Not on file  Stress: Not on file  Social Connections: Not on file   Outpatient Medications Prior to Visit  Medication Sig  . cetirizine-pseudoephedrine (ZYRTEC-D) 5-120 MG tablet Take 1 tablet by mouth daily. (Patient not  taking: Reported on 11/02/2021)  . ciprofloxacin-dexamethasone (CIPRODEX) OTIC suspension Place 4 drops into the right ear 2 (two) times daily. (Patient not taking: Reported on 06/02/2017)  . ibuprofen (ADVIL) 600 MG tablet Take 1 tablet (600 mg total) by mouth every 6 (six) hours as needed. (Patient not taking: Reported on 11/02/2021)  . lisdexamfetamine (VYVANSE) 30 MG capsule Take 30 mg by mouth daily. (Patient not taking: Reported on 11/02/2021)  . methylphenidate (RITALIN) 10 MG tablet Take 10 mg by mouth daily as needed (ADD).  (Patient not taking: Reported on 11/02/2021)  . metoprolol tartrate (LOPRESSOR) 50 MG tablet Take by mouth. (Patient not taking: Reported on 11/02/2021)  . Multiple Vitamins-Minerals (MULTIVITAMIN ADULT PO) Take 2 each by mouth daily. gummies (Patient not taking: Reported on 11/02/2021)  . nitrofurantoin, macrocrystal-monohydrate, (MACROBID) 100 MG capsule Take 1 capsule (100 mg total) by mouth 2 (two) times daily. (Patient not taking: Reported on 11/02/2021)  . ondansetron (ZOFRAN) 4 MG tablet Take 1 tablet (4 mg total) by mouth every 6 (six) hours. (Patient not taking: Reported on 11/02/2021)  . SUMAtriptan (IMITREX) 100 MG tablet TAKE ONE TABLET AT EARLIEST SIGN OF MIGRAINE HEADACHE. (Patient not taking: Reported on 11/02/2021)   No facility-administered medications prior to visit.   Allergies  Allergen Reactions  . Zithromax [Azithromycin] Itching and Rash    There is no immunization history on file for this patient.  Review of Systems All review of systems negative except what is listed in the HPI   Objective    BP 128/88   Pulse 81   Ht 5' (1.524 m)   Wt 185 lb (83.9 kg)   LMP 10/14/2021 (Exact Date) Comment: neg preg test  SpO2 96%   BMI 36.13 kg/m  Physical Exam  No results found for any visits on 11/02/21.  Assessment & Plan      Problem List Items Addressed This Visit   None    No follow-ups on file.      Faylinn Schwenn, Coralee Pesa, NP, DNP,  AGNP-C [facility] at [place]

## 2021-11-02 NOTE — Patient Instructions (Signed)
Thank you for choosing [place] at [facility] for your Primary Care needs. I am excited for the opportunity to partner with you to meet your health care goals. It was a pleasure meeting you today!  Recommendations from today's visit: I have sent referrals for you to neurology, psychology, and psychiatry. You should hear from these offices in the next 2 weeks. If you do not hear from them directly, please let us know.  I will let you know what your labs show.  If you have any new symptoms or concerns, please let us know  Information on diet, exercise, and health maintenance recommendations are listed below. This is information to help you be sure you are on track for optimal health and monitoring.   Please look over this and let us know if you have any questions or if you have completed any of the health maintenance outside of [place] so that we can be sure your records are up to date.  ___________________________________________________________ About Me: I am an Adult-Geriatric Nurse Practitioner with a background in caring for patients for more than 20 years with a strong intensive care background. I provide primary care and sports medicine services to patients age 34 and older within this office. My education had a strong focus on caring for the older adult population, which I am passionate about. I am also the director of the APP Fellowship with [facility].   My desire is to provide you with the best service through preventive medicine and supportive care. I consider you a part of the medical team and value your input. I work diligently to ensure that you are heard and your needs are met in a safe and effective manner. I want you to feel comfortable with me as your provider and want you to know that your health concerns are important to me.  For your information, our office hours are: Monday, Tuesday, and Thursday 8:00 AM - 5:00 PM Wednesday and Friday 8:00 AM -  12:00 PM.   In my time away from the office I am teaching new APP's within the system and am unavailable, but my partner, Dr. Burnard Bunting is in the office for emergent needs.   If you have questions or concerns, please call our office at [phone] or send [place] a MyChart message and we will respond as quickly as possible.  ____________________________________________________________ MyChart:  For all urgent or time sensitive needs we ask that you please call the office to avoid delays. Our number is [phone]. MyChart is not constantly monitored and due to the large volume of messages a day, replies may take up to 72 business hours.  MyChart Policy: MyChart allows for you to see your visit notes, after visit summary, provider recommendations, lab and tests results, make an appointment, request refills, and contact your provider or the office for non-urgent questions or concerns. Providers are seeing patients during normal business hours and do not have built in time to review MyChart messages.  We ask that you allow a minimum of 3 business days for responses to [employer]. For this reason, please do not send urgent requests through [place]. Please call the office at [phone]. New and ongoing conditions may require a visit. We have virtual and in person visit available for your convenience.  Complex MyChart concerns may require a visit. Your provider may request you schedule a virtual or in person visit to ensure we are providing the best care possible. MyChart messages sent after 11:00  AM on Friday will not be received by the provider until Monday morning.    Lab and Test Results: You will receive your lab and test results on MyChart as soon as they are completed and results have been sent by the lab or testing facility. Due to this service, you will receive your results BEFORE your provider.  I review lab and tests results each morning prior to seeing patients. Some results require  collaboration with other providers to ensure you are receiving the most appropriate care. For this reason, we ask that you please allow a minimum of 3-5 business days from the time the ALL results have been received for your provider to receive and review lab and test results and contact you about these.  Most lab and test result comments from the provider will be sent through [place]. Your provider may recommend changes to the plan of care, follow-up visits, repeat testing, ask questions, or request an office visit to discuss these results. You may reply directly to this message or call the office at [phone] to provide information for the provider or set up an appointment. In some instances, you will be called with test results and recommendations. Please let us know if this is preferred and we will make note of this in your chart to provide this for you.    If you have not heard a response to your lab or test results in 5 business days from all results returning to [place], please call the office to let us know. We ask that you please avoid calling prior to this time unless there is an emergent concern. Due to high call volumes, this can delay the resulting process.  After Hours: For all non-emergency after hours needs, please call the office at [phone] and select the option to reach the on-call provider service. On-call services are shared between multiple [place] offices and therefore it will not be possible to speak directly with your provider. On-call providers may provide medical advice and recommendations, but are unable to provide refills for maintenance medications.  For all emergency or urgent medical needs after normal business hours, we recommend that you seek care at the closest Urgent Care or Emergency Department to ensure appropriate treatment in a timely manner.  [facility] has a 24 hour emergency room located on the ground floor for your convenience.    Urgent Concerns During the Business Day Providers are seeing patients from 8AM to [place] with a busy schedule and are most often not able to respond to non-urgent calls until the end of the day or the next business day. If you should have URGENT concerns during the day, please call and speak to the nurse or schedule a same day appointment so that we can address your concern without delay.   Thank you, again, for choosing me as your health care partner. I appreciate your trust and look forward to learning more about you.   Worthy Keeler, DNP, AGNP-c ___________________________________________________________  Health Maintenance Recommendations Screening Testing Mammogram Every 1 -2 years based on history and risk factors Starting at age 58 Pap Smear Ages 21-39 every 3 years Ages 25-65 every 5 years with HPV testing More frequent testing may be required based on results and history Colon Cancer Screening Every 1-10 years based on test performed, risk factors, and history Starting at age 27 Bone Density Screening Every 2-10 years based on history Starting at age 4 for women Recommendations for men differ based on medication  usage, history, and risk factors AAA Screening One time ultrasound Men 71-20 years old who have every smoked Lung Cancer Screening Low Dose Lung CT every 12 months Age 75-80 years with a 30 pack-year smoking history who still smoke or who have quit within the last 15 years  Screening Labs Routine  Labs: Complete Blood Count (CBC), Complete Metabolic Panel (CMP), Cholesterol (Lipid Panel) Every 6-12 months based on history and medications May be recommended more frequently based on current conditions or previous results Hemoglobin A1c Lab Every 3-12 months based on history and previous results Starting at age 22 or earlier with diagnosis of diabetes, high cholesterol, BMI >26, and/or risk factors Frequent monitoring for patients with diabetes to ensure blood  sugar control Thyroid Panel (TSH w/ T3 & T4) Every 6 months based on history, symptoms, and risk factors May be repeated more often if on medication HIV One time testing for all patients 26 and older May be repeated more frequently for patients with increased risk factors or exposure Hepatitis C One time testing for all patients 3 and older May be repeated more frequently for patients with increased risk factors or exposure Gonorrhea, Chlamydia Every 12 months for all sexually active persons 13-24 years Additional monitoring may be recommended for those who are considered high risk or who have symptoms PSA Men 68-70 years old with risk factors Additional screening may be recommended from age 40-69 based on risk factors, symptoms, and history  Vaccine Recommendations Tetanus Booster All adults every 10 years Flu Vaccine All patients 6 months and older every year COVID Vaccine All patients 12 years and older Initial dosing with booster May recommend additional booster based on age and health history HPV Vaccine 2 doses all patients age 55-26 Dosing may be considered for patients over 26 Shingles Vaccine (Shingrix) 2 doses all adults 58 years and older Pneumonia (Pneumovax 23) All adults 69 years and older May recommend earlier dosing based on health history Pneumonia (Prevnar 65) All adults 14 years and older Dosed 1 year after Pneumovax 23  Additional Screening, Testing, and Vaccinations may be recommended on an individualized basis based on family history, health history, risk factors, and/or exposure.  __________________________________________________________  Diet Recommendations for All Patients  I recommend that all patients maintain a diet low in saturated fats, carbohydrates, and cholesterol. While this can be challenging at first, it is not impossible and small changes can make big differences.  Things to try: Decreasing the amount of soda, sweet tea, and/or juice  to one or less per day and replace with water While water is always the first choice, if you do not like water you may consider adding a water additive without sugar to improve the taste other sugar free drinks Replace potatoes with a brightly colored vegetable at dinner Use healthy oils, such as canola oil or olive oil, instead of butter or hard margarine Limit your bread intake to two pieces or less a day Replace regular pasta with low carb pasta options Bake, broil, or grill foods instead of frying Monitor portion sizes  Eat smaller, more frequent meals throughout the day instead of large meals  An important thing to remember is, if you love foods that are not great for your health, you don't have to give them up completely. Instead, allow these foods to be a reward when you have done well. Allowing yourself to still have special treats every once in a while is a nice way to tell yourself thank you for working  hard to keep yourself healthy.   Also remember that every day is a new day. If you have a bad day and "fall off the wagon", you can still climb right back up and keep moving along on your journey!  We have resources available to help you!  Some websites that may be helpful include: www.http://carter.biz/  Www.[employer]Well[employer].com _____________________________________________________________  Activity Recommendations for All Patients  I recommend that all adults get at least 20 minutes of moderate physical activity that elevates your heart rate at least 5 days out of the week.  Some examples include: Walking or jogging at a pace that allows you to carry on a conversation Cycling (stationary bike or outdoors) Water aerobics Yoga Weight lifting Dancing If physical limitations prevent you from putting stress on your joints, exercise in a pool or seated in a chair are excellent options.  Do determine your MAXIMUM heart rate for activity: YOUR AGE - 220 = MAX HeartRate   Remember! Do not  push yourself too hard.  Start slowly and build up your pace, speed, weight, time in exercise, etc.  Allow your body to rest between exercise and get good sleep. You will need more water than normal when you are exerting yourself. Do not wait until you are thirsty to drink. Drink with a purpose of getting in at least 8, 8 ounce glasses of water a day plus more depending on how much you exercise and sweat.    If you begin to develop dizziness, chest pain, abdominal pain, jaw pain, shortness of breath, headache, vision changes, lightheadedness, or other concerning symptoms, stop the activity and allow your body to rest. If your symptoms are severe, seek emergency evaluation immediately. If your symptoms are concerning, but not severe, please let us know so that we can recommend further evaluation.

## 2021-11-03 LAB — THYROID PANEL WITH TSH
Free Thyroxine Index: 1.8 (ref 1.2–4.9)
T3 Uptake Ratio: 25 % (ref 24–39)
T4, Total: 7 ug/dL (ref 4.5–12.0)
TSH: 2.25 u[IU]/mL (ref 0.450–4.500)

## 2021-11-03 LAB — CBC WITH DIFFERENTIAL/PLATELET
Basophils Absolute: 0.1 10*3/uL (ref 0.0–0.2)
Basos: 1 %
EOS (ABSOLUTE): 0.2 10*3/uL (ref 0.0–0.4)
Eos: 3 %
Hematocrit: 44.9 % (ref 34.0–46.6)
Hemoglobin: 14.7 g/dL (ref 11.1–15.9)
Immature Grans (Abs): 0 10*3/uL (ref 0.0–0.1)
Immature Granulocytes: 0 %
Lymphocytes Absolute: 2.7 10*3/uL (ref 0.7–3.1)
Lymphs: 29 %
MCH: 30 pg (ref 26.6–33.0)
MCHC: 32.7 g/dL (ref 31.5–35.7)
MCV: 92 fL (ref 79–97)
Monocytes Absolute: 0.5 10*3/uL (ref 0.1–0.9)
Monocytes: 6 %
Neutrophils Absolute: 5.8 10*3/uL (ref 1.4–7.0)
Neutrophils: 61 %
Platelets: 360 10*3/uL (ref 150–450)
RBC: 4.9 x10E6/uL (ref 3.77–5.28)
RDW: 11.9 % (ref 11.7–15.4)
WBC: 9.3 10*3/uL (ref 3.4–10.8)

## 2021-11-03 LAB — COMPREHENSIVE METABOLIC PANEL
ALT: 24 IU/L (ref 0–32)
AST: 16 IU/L (ref 0–40)
Albumin/Globulin Ratio: 1.6 (ref 1.2–2.2)
Albumin: 4.3 g/dL (ref 3.9–5.0)
Alkaline Phosphatase: 89 IU/L (ref 44–121)
BUN/Creatinine Ratio: 25 — ABNORMAL HIGH (ref 9–23)
BUN: 13 mg/dL (ref 6–20)
Bilirubin Total: 0.2 mg/dL (ref 0.0–1.2)
CO2: 23 mmol/L (ref 20–29)
Calcium: 9.5 mg/dL (ref 8.7–10.2)
Chloride: 103 mmol/L (ref 96–106)
Creatinine, Ser: 0.51 mg/dL — ABNORMAL LOW (ref 0.57–1.00)
Globulin, Total: 2.7 g/dL (ref 1.5–4.5)
Glucose: 122 mg/dL — ABNORMAL HIGH (ref 70–99)
Potassium: 4.3 mmol/L (ref 3.5–5.2)
Sodium: 140 mmol/L (ref 134–144)
Total Protein: 7 g/dL (ref 6.0–8.5)
eGFR: 136 mL/min/{1.73_m2} (ref >59)

## 2021-11-03 LAB — LIPID PANEL
Chol/HDL Ratio: 3.8 ratio (ref 0.0–4.4)
Cholesterol, Total: 173 mg/dL (ref 100–199)
HDL: 46 mg/dL (ref >39)
LDL Chol Calc (NIH): 100 mg/dL — ABNORMAL HIGH (ref 0–99)
Triglycerides: 153 mg/dL — ABNORMAL HIGH (ref 0–149)
VLDL Cholesterol Cal: 27 mg/dL (ref 5–40)

## 2021-11-03 LAB — HEMOGLOBIN A1C
Est. average glucose Bld gHb Est-mCnc: 128 mg/dL
Hgb A1c MFr Bld: 6.1 % — ABNORMAL HIGH (ref 4.8–5.6)

## 2021-11-03 LAB — VITAMIN D 25 HYDROXY (VIT D DEFICIENCY, FRACTURES): Vit D, 25-Hydroxy: 14.7 ng/mL — ABNORMAL LOW (ref 30.0–100.0)

## 2021-11-03 MED ORDER — VITAMIN D (ERGOCALCIFEROL) 1.25 MG (50000 UNIT) PO CAPS: 50000.0000 [IU] | ORAL_CAPSULE | ORAL | 0 refills | Status: DC

## 2021-11-05 ENCOUNTER — Telehealth (HOSPITAL_BASED_OUTPATIENT_CLINIC_OR_DEPARTMENT_OTHER): Payer: Self-pay

## 2021-11-05 ENCOUNTER — Encounter (HOSPITAL_BASED_OUTPATIENT_CLINIC_OR_DEPARTMENT_OTHER): Payer: Self-pay | Admitting: Nurse Practitioner

## 2021-11-05 DIAGNOSIS — N946 Dysmenorrhea, unspecified: Secondary | ICD-10-CM

## 2021-11-05 NOTE — Telephone Encounter (Signed)
Patient called back and left voicemail in regard to her lab results. Patient viewed provider comments in MyChart. Provider sent prescription for Vit D. Please return call.

## 2021-11-06 MED ORDER — NORETHIN ACE-ETH ESTRAD-FE 1-20 MG-MCG PO TABS: 1.0000 | ORAL_TABLET | Freq: Every day | ORAL | 3 refills | Status: DC

## 2021-11-09 ENCOUNTER — Telehealth (HOSPITAL_BASED_OUTPATIENT_CLINIC_OR_DEPARTMENT_OTHER): Payer: Self-pay

## 2021-11-09 DIAGNOSIS — G4485 Primary stabbing headache: Secondary | ICD-10-CM

## 2021-11-09 DIAGNOSIS — L503 Dermatographic urticaria: Secondary | ICD-10-CM | POA: Insufficient documentation

## 2021-11-09 DIAGNOSIS — L509 Urticaria, unspecified: Secondary | ICD-10-CM

## 2021-11-09 DIAGNOSIS — K219 Gastro-esophageal reflux disease without esophagitis: Secondary | ICD-10-CM | POA: Insufficient documentation

## 2021-11-09 HISTORY — DX: Primary stabbing headache: G44.85

## 2021-11-09 HISTORY — DX: Urticaria, unspecified: L50.9

## 2021-11-09 NOTE — Telephone Encounter (Signed)
Received telephone message from [place] that patient had questions, I called patient she stated all questions had been answered.

## 2021-11-09 NOTE — Assessment & Plan Note (Signed)
History of ADHD previously managed with Vyvanse in the morning and Ritalin PRN in the afternoon. No medication for the past several years. She does endorse that symptoms are worsening. 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 Given the mutifactoral approach needed for safe control, I do feel that her symptoms would best be managed under the care of psychiatry at this time. Will also send referral for counseling as this would likely be helpful, as well. No alarm symptoms present at this time.

## 2021-11-09 NOTE — Assessment & Plan Note (Signed)
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 She does have several concerns with mental health that will likely require close observation and management as well as medications to help control. I do feel that given the complexity of her symptoms, management would best be supported with psychiatry. Will send referral today. Will also send referral for counseling to help with management of symptoms and improve coping mechanisms. No alarm symptoms present at this time.

## 2021-11-09 NOTE — Progress Notes (Signed)
Orma Render, DNP, AGNP-c [facility] [address]  [place], [place] [address] [phone]  New patient visit   Patient: Alyssa Bennett   DOB: November 11, 2000   21 y.o. Female  MRN: [id number] Visit Date: 11/02/2021  Patient Care Team: Oda Lansdowne, Coralee Pesa, NP as PCP - General (Nurse Practitioner)  Today's Vitals   11/02/21 1032  BP: 128/88  Pulse: 81  SpO2: 96%  Weight: 185 lb (83.9 kg)  Height: 5' (1.524 m)   Body mass index is 36.13 kg/m.   Today's healthcare provider: Orma Render, NP   Chief Complaint  Patient presents with   New Patient (Initial Visit)    Establish care. Went to ED regarding cyst on ovary. Interested PAP next visit.  Allergy is not working Discuss adhd meds   Subjective    Alyssa Bennett is a 21 y.o. female who presents today as a new patient to establish care.    Patient endorses the following concerns presently:  Ovarian cyst recently found on [place] Irregular periods sometimes don't bleed, but "go through the motions" , sometimes bleed but "don't go through the motions" Always have been this way No severe acne, no abnormal hair growth No known glucose intolerance   WBC very elevated on recent ED visit Would like to have this rechecked No signs of active infection   Nausea daily Chronic constipation She did see GI when she was "really little" At it's worst 2-3 days having a bowel movement Seems a little better because she increased fiber Either constipation or diarrhea Has had a small amount of blood related to fissure in the past, but no large amounts of blood or new bleeding Having cramping and pain    Acid Reflux She tries to keep tums on her at all times Sometimes she feels like she is taking these quite often She has had some shortness of breath, but not sure if this is GERD No CP palpitations, dizziness   Headache  Stabbing sensation in the back of the head- not  migraine happening frequently times them when they happen  Last a few minutes then resolve spontaneously Can come several times during an attack  Also endorses history of panic attacks She reports that she has a suspected eating disorder Hx of ADHD- vyvanse 68m and ritalin 148m in the past- has been off since [age]ears old- it's been worse since being off of medication Endorses Tics- worse when she is around other people- feel like a sneeze and hiccup combined "everywhere"  Endorses Itching on hands and feet/ swelling with heat- also has dermographia  Can't tolerate cold for long    History reviewed and reveals the following: Past Medical History:  Diagnosis Date   ADHD (attention deficit hyperactivity disorder)    Migraine    Past Surgical History:  Procedure Laterality Date   TONSILLECTOMY     Family Status  Relation Name Status   Mother  Alive   Father  Alive   Sister [id number]   Brother [id number]   MGM  Alive   MGF  Alive   PGM  Alive   PGF  Alive   Neg Hx  (Not Specified)   Family History  Problem Relation Age of Onset   Migraines Mother    ADD / ADHD Mother    Anxiety disorder Mother    ADD / ADHD Brother    Migraines Maternal Grandmother    Anxiety disorder  Maternal Grandmother    Seizures Neg Hx    Autism Neg Hx    Depression Neg Hx    Bipolar disorder Neg Hx    Schizophrenia Neg Hx    Social History   Socioeconomic History   Marital status: Single    Spouse name: Not on file   Number of children: Not on file   Years of education: Not on file   Highest education level: Not on file  Occupational History   Not on file  Tobacco Use   Smoking status: Never   Smokeless tobacco: Never  Substance and Sexual Activity   Alcohol use: No   Drug use: No   Sexual activity: Never  Other Topics Concern   Not on file  Social History Narrative   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 She  enjoys music, science and hanging with friends and family   Social Determinants of Health   Financial Resource Strain: Not on file  Food Insecurity: Not on file  Transportation Needs: Not on file  Physical Activity: Not on file  Stress: Not on file  Social Connections: Not on file   Outpatient Medications Prior to Visit  Medication Sig   [DISCONTINUED] cetirizine-pseudoephedrine (ZYRTEC-D) 5-120 MG tablet Take 1 tablet by mouth daily. (Patient not taking: Reported on 11/02/2021)   [DISCONTINUED] ciprofloxacin-dexamethasone (CIPRODEX) OTIC suspension Place 4 drops into the right ear 2 (two) times daily. (Patient not taking: Reported on 06/02/2017)   [DISCONTINUED] ibuprofen (ADVIL) 600 MG tablet Take 1 tablet (600 mg total) by mouth every 6 (six) hours as needed. (Patient not taking: Reported on 11/02/2021)   [DISCONTINUED] lisdexamfetamine (VYVANSE) 30 MG capsule Take 30 mg by mouth daily. (Patient not taking: Reported on 11/02/2021)   [DISCONTINUED] methylphenidate (RITALIN) 10 MG tablet Take 10 mg by mouth daily as needed (ADD).  (Patient not taking: Reported on 11/02/2021)   [DISCONTINUED] metoprolol tartrate (LOPRESSOR) 50 MG tablet Take by mouth. (Patient not taking: Reported on 11/02/2021)   [DISCONTINUED] Multiple Vitamins-Minerals (MULTIVITAMIN ADULT PO) Take 2 each by mouth daily. gummies (Patient not taking: Reported on 11/02/2021)   [DISCONTINUED] nitrofurantoin, macrocrystal-monohydrate, (MACROBID) 100 MG capsule Take 1 capsule (100 mg total) by mouth 2 (two) times daily. (Patient not taking: Reported on 11/02/2021)   [DISCONTINUED] ondansetron (ZOFRAN) 4 MG tablet Take 1 tablet (4 mg total) by mouth every 6 (six) hours. (Patient not taking: Reported on 11/02/2021)   [DISCONTINUED] SUMAtriptan (IMITREX) 100 MG tablet TAKE ONE TABLET AT EARLIEST SIGN OF MIGRAINE HEADACHE. (Patient not taking: Reported on 11/02/2021)   No facility-administered medications prior to visit.   Allergies   Allergen Reactions   Zithromax [Azithromycin] Itching and Rash   Immunization History  Administered Date(s) Administered   DTaP 02/12/2000, 03/21/2000, 05/03/2000, 03/21/2002, 02/04/2004   HPV Quadrivalent 04/12/2011, 10/19/2012   Hepatitis A, Ped/Adol-2 Dose 04/01/2005, 10/19/2005   Hepatitis B, ped/adol Dec 08, 1999, 01/01/2000, 08/04/2000   HiB (PRP-T) 02/12/2000, 03/21/2000, 05/03/2000, 03/21/2002   IPV 02/12/2000, 03/21/2000, 05/03/2000, 01/12/2001, 02/04/2004   Influenza Nasal 01/28/2006, 03/20/2008, 07/09/2010, 04/12/2011   Influenza Split 03/21/2002, 04/06/2006, 05/27/2016   Influenza,inj,Quad PF,6+ Mos 04/01/2017, 03/26/2020   MMR 01/12/2001, 02/04/2004   Meningococcal Conjugate 11/30/2010   Pneumococcal Conjugate PCV 7 03/16/2000, 05/03/2000, 08/04/2000, 03/21/2002   Tdap 11/30/2010   Varicella 01/12/2001, 01/16/2007    Review of Systems All review of systems negative except what is listed in the HPI   Objective  BP 128/88   Pulse 81   Ht 5' (1.524 m)   Wt 185 lb (83.9 kg)   LMP 10/14/2021 (Exact Date) Comment: neg preg test  SpO2 96%   BMI 36.13 kg/m  Physical Exam Vitals and nursing note reviewed.  Constitutional:      General: She is not in acute distress.    Appearance: Normal appearance.  Eyes:     Extraocular Movements: Extraocular movements intact.     Conjunctiva/sclera: Conjunctivae normal.     Pupils: Pupils are equal, round, and reactive to light.  Neck:     Vascular: No carotid bruit.  Cardiovascular:     Rate and Rhythm: Normal rate and regular rhythm.     Pulses: Normal pulses.     Heart sounds: Normal heart sounds. No murmur heard. Pulmonary:     Effort: Pulmonary effort is normal.     Breath sounds: Normal breath sounds. No wheezing.  Abdominal:     General: Bowel sounds are normal. There is no distension.     Palpations: Abdomen is soft.     Tenderness: There is abdominal tenderness in the epigastric area.  Musculoskeletal:         General: Normal range of motion.     Cervical back: Normal range of motion.     Right lower leg: No edema.     Left lower leg: No edema.  Skin:    General: Skin is warm and dry.     Capillary Refill: Capillary refill takes less than 2 seconds.  Neurological:     General: No focal deficit present.     Mental Status: She is alert and oriented to person, place, and time.  Psychiatric:        Mood and Affect: Mood normal.        Behavior: Behavior normal.        Thought Content: Thought content normal.        Judgment: Judgment normal.     Results for orders placed or performed in visit on 11/02/21  Comprehensive metabolic panel  Result Value Ref Range   Glucose 122 (H) 70 - 99 mg/dL   BUN 13 6 - 20 mg/dL   Creatinine, Ser 0.51 (L) 0.57 - 1.00 mg/dL   eGFR 136 >59 mL/min/1.73   BUN/Creatinine Ratio 25 (H) 9 - 23   Sodium 140 134 - 144 mmol/L   Potassium 4.3 3.5 - 5.2 mmol/L   Chloride 103 96 - 106 mmol/L   CO2 23 20 - 29 mmol/L   Calcium 9.5 8.7 - 10.2 mg/dL   Total Protein 7.0 6.0 - 8.5 g/dL   Albumin 4.3 3.9 - 5.0 g/dL   Globulin, Total 2.7 1.5 - 4.5 g/dL   Albumin/Globulin Ratio 1.6 1.2 - 2.2   Bilirubin Total 0.2 0.0 - 1.2 mg/dL   Alkaline Phosphatase 89 44 - 121 IU/L   AST 16 0 - 40 IU/L   ALT 24 0 - 32 IU/L  Hemoglobin A1c  Result Value Ref Range   Hgb A1c MFr Bld 6.1 (H) 4.8 - 5.6 %   Est. average glucose Bld gHb Est-mCnc 128 mg/dL  Lipid panel  Result Value Ref Range   Cholesterol, Total 173 100 - 199 mg/dL   Triglycerides 153 (H) 0 - 149 mg/dL   HDL 46 >39 mg/dL   VLDL Cholesterol Cal 27 5 - 40 mg/dL   LDL Chol Calc (NIH) 100 (H) 0 - 99 mg/dL   Chol/HDL Ratio 3.8 0.0 -  4.4 ratio  Thyroid Panel With TSH  Result Value Ref Range   TSH 2.250 0.450 - 4.500 uIU/mL   T4, Total 7.0 4.5 - 12.0 ug/dL   T3 Uptake Ratio 25 24 - 39 %   Free Thyroxine Index 1.8 1.2 - 4.9  VITAMIN D 25 Hydroxy (Vit-D Deficiency, Fractures)  Result Value Ref Range   Vit D, 25-Hydroxy  14.7 (L) 30.0 - 100.0 ng/mL  CBC with Differential/Platelet  Result Value Ref Range   WBC 9.3 3.4 - 10.8 x10E3/uL   RBC 4.90 3.77 - 5.28 x10E6/uL   Hemoglobin 14.7 11.1 - 15.9 g/dL   Hematocrit 44.9 34.0 - 46.6 %   MCV 92 79 - 97 fL   MCH 30.0 26.6 - 33.0 pg   MCHC 32.7 31.5 - 35.7 g/dL   RDW 11.9 11.7 - 15.4 %   Platelets 360 150 - 450 x10E3/uL   Neutrophils 61 Not Estab. %   Lymphs 29 Not Estab. %   Monocytes 6 Not Estab. %   Eos 3 Not Estab. %   Basos 1 Not Estab. %   Neutrophils Absolute 5.8 1.4 - 7.0 x10E3/uL   Lymphocytes Absolute 2.7 0.7 - 3.1 x10E3/uL   Monocytes Absolute 0.5 0.1 - 0.9 x10E3/uL   EOS (ABSOLUTE) 0.2 0.0 - 0.4 x10E3/uL   Basophils Absolute 0.1 0.0 - 0.2 x10E3/uL   Immature Granulocytes 0 Not Estab. %   Immature Grans (Abs) 0.0 0.0 - 0.1 x10E3/uL    Assessment & Plan      Problem List Items Addressed This Visit     Attention deficit disorder (ADD) without hyperactivity    History of ADHD previously managed with Vyvanse in the morning and Ritalin PRN in the afternoon. No medication for the past several years. She does endorse that symptoms are worsening. 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 Given the mutifactoral approach needed for safe control, I do feel that her symptoms would best be managed under the care of psychiatry at this time. Will also send referral for counseling as this would likely be helpful, as well. No alarm symptoms present at this time.       Chronic constipation    Long term hx of chronic constipation. Patient endorses having a B every 2-3 days. When she does she denies these are hard or painful. It sounds as though her normal bowel patterns are for every 2-3 days. She likely does have a touch of IBS given the intermittent diarrhea she does experience, as well. Recommend continuation of high fiber diet and increased water. Physical activity may also be  helpful. Avoid foods that trigger diarrhea. Keep a food journal to monitor closely and determine if there are specific triggers. May need GI evaluation again if symptoms persist.       Generalized anxiety disorder    Anxiety disorder with panic symptoms not well controlled at this time. She does have several concerns with mental health that will likely require close observation and management as well as medications to help control. I do feel that given the complexity of her symptoms, management would best be supported with psychiatry. Will send referral today. Will also send referral for counseling to help with management of symptoms and improve coping mechanisms. No alarm symptoms present at this time.       Relevant Orders   Ambulatory referral to Psychology   Ambulatory referral to Psychiatry  Irregular periods    Menstrual irregularities of unknown etiology. No alarm sx present at this time. It is possible she is experiencing PCOS, although no evidence of polycystic ovaries have been established. Discussed with patient that we can trial OCP to see if this is helpful in managing her menstrual cycle and obtain basic labs today. She is interested in this. Will send medication today and monitor.       Tourette's syndrome    Reported Tics with history of tourettes syndrome in chart review. Given her history of ADHD, anxiety, panic attacks and tics, I do feel that evaluation and management with psychiatry would be most beneficial for her at this time. She will likely need to start on new medications as she has been off of these for quite some time. Will send referral today for both psychiatry services and counseling services to help with anxiety and panic, ADHD, and Tics. No alarm symptoms present at this time.       Stabbing headache - Primary    Symptoms consistent with primary stabbing headaches coming in clusters. Will trial maxalt to see if these are helpful for symptom management. No alarm sx  present at this time. No neurological deficits at this time. Given the history of the headaches, patient would like neurology referral- I do feel that this is reasonable given the changes in headaches she has experienced.  Patient will follow-up if the sx worsen or fail to improve with the treatment.       Relevant Medications   rizatriptan (MAXALT) 10 MG tablet   Other Relevant Orders   Ambulatory referral to Neurology   Gastroesophageal reflux disease    Presentation consistent with GERD.  Recommendations for dietary changes that can assist in management of the problem.  Given the patient is taking abortive medication frequently we will trial 30-day supply of esomeprazole to see if we can get improved control of her symptoms. If improvement, we can trial off of medication to see if this was helpful. Recommend f/uif no improvement of symptoms or if symptoms worsen.       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 Likely histamine reaction. It sounds like this has been going on for quite some time. Recommend continuation of allergy medication for modification of these symptoms. Recommend trial of xyzal to see if this is helpful. Will also obtain labs today.       Relevant Medications   levocetirizine (XYZAL) 5 MG tablet   Other Visit Diagnoses     Screening for endocrine, nutritional, metabolic and immunity disorder       Relevant Orders   Comprehensive metabolic panel (Completed)   Hemoglobin A1c (Completed)   Lipid panel (Completed)   Thyroid Panel With TSH (Completed)   VITAMIN D 25 Hydroxy (Vit-D Deficiency, Fractures) (Completed)   CBC with Differential/Platelet (Completed)   Nausea       Relevant Medications   ondansetron (ZOFRAN) 8 MG tablet   Vitamin D deficiency       Relevant Medications   Vitamin D, Ergocalciferol, (DRISDOL) 1.25 MG (50000 UNIT)  CAPS capsule        Return for  adhd, pap (needs 40).    Time: 62 minutes, >50% spent counseling, care coordination, chart review, and documentation.    Opel Lejeune, Coralee Pesa, NP, DNP, AGNP-C [facility] at [facility]  [place]

## 2021-11-09 NOTE — Assessment & Plan Note (Signed)
Menstrual irregularities of unknown etiology. No alarm sx present at this time. It is possible she is experiencing PCOS, although no evidence of polycystic ovaries have been established. Discussed with patient that we can trial OCP to see if this is helpful in managing her menstrual cycle and obtain basic labs today. She is interested in this. Will send medication today and monitor.

## 2021-11-09 NOTE — Assessment & Plan Note (Signed)
Long term hx of chronic constipation. Patient endorses having a B every 2-3 days. When she does she denies these are hard or painful. It sounds as though her normal bowel patterns are for every 2-3 days. She likely does have a touch of IBS given the intermittent diarrhea she does experience, as well. Recommend continuation of high fiber diet and increased water. Physical activity may also be helpful. Avoid foods that trigger diarrhea. Keep a food journal to monitor closely and determine if there are specific triggers. May need GI evaluation again if symptoms persist.

## 2021-11-09 NOTE — Assessment & Plan Note (Signed)
Endorses dermographia and pruritis to hands and feet. Likely histamine reaction. It sounds like this has been going on for quite some time. Recommend continuation of allergy medication for modification of these symptoms. Recommend trial of xyzal to see if this is helpful. Will also obtain labs today.

## 2021-11-09 NOTE — Assessment & Plan Note (Signed)
Presentation consistent with GERD.  Recommendations for dietary changes that can assist in management of the problem.  Given the patient is taking abortive medication frequently we will trial 30-day supply of esomeprazole to see if we can get improved control of her symptoms. If improvement, we can trial off of medication to see if this was helpful. Recommend f/uif no improvement of symptoms or if symptoms worsen.

## 2021-11-09 NOTE — Assessment & Plan Note (Addendum)
Symptoms consistent with primary stabbing headaches coming in clusters. Will trial maxalt to see if these are helpful for symptom management. No alarm sx present at this time. No neurological deficits at this time. Given the history of the headaches, patient would like neurology referral- I do feel that this is reasonable given the changes in headaches she has experienced.  Patient will follow-up if the sx worsen or fail to improve with the treatment.

## 2021-11-09 NOTE — Assessment & Plan Note (Signed)
Reported Tics with history of tourettes syndrome in chart review. Given her history of ADHD, anxiety, panic attacks and tics, I do feel that evaluation and management with psychiatry would be most beneficial for her at this time. She will likely need to start on new medications as she has been off of these for quite some time. Will send referral today for both psychiatry services and counseling services to help with anxiety and panic, ADHD, and Tics. No alarm symptoms present at this time.

## 2021-11-26 NOTE — Progress Notes (Signed)
Referring:  Tollie Eth, NP [address] [place],  [place] [address]  PCP: Tollie Eth, NP  Neurology was asked to evaluate Alyssa Bennett, a 21 year old female for a chief complaint of headaches.  Our recommendations of care will be communicated by shared medical record.    CC:  headaches  History provided from self, fiancee Caitlin  HPI:  Medical co-morbidities: GERD, ADD, anxiety, migraines  The patient presents for evaluation of headaches which began when she was 21 years old. Headaches are described as stabbing pain in her right occiput. They are not associated with photophobia, phonophobia, or nausea. She occasionally will have scalp paresthesias as well. Sometimes her right eye will get red, but she also notes she will press on her eyes when the pain is bad. Otherwise no ipsilateral autonomic symptoms. Headaches will last a few minutes at a time, but can occur on and off over the span of hours. These occur up to 4 times per month.  She does have a history of migraines and was recently prescribed Maxalt for rescue. She has not had an opportunity to try it yet.  Headache History: Onset: age 39 Triggers: no Aura: no Location: right occiput Quality/Description: stabbing Associated Symptoms:  Photophobia: no  Phonophobia: no  Nausea: no  Right eye can get red Worse with activity?: no, feels worse if she lies down Duration of headaches: a few minutes on and off for 3-4 hours  Headache days per month: 4 Headache free days per month: 26  Current Treatment: Abortive Maxalt 10 mg   Preventative None  Prior Therapies                                 Maxalt 10 mg PRN   LABS: CBC    Component Value Date/Time   WBC 9.3 11/02/2021 1132   WBC 16.6 (H) 10/14/2021 1601   RBC 4.90 11/02/2021 1132   RBC 4.48 10/14/2021 1601   HGB 14.7 11/02/2021 1132   HCT 44.9 11/02/2021 1132   PLT 360 11/02/2021 1132   MCV 92 11/02/2021 1132   MCH 30.0 11/02/2021  1132   MCH 30.4 10/14/2021 1601   MCHC 32.7 11/02/2021 1132   MCHC 33.5 10/14/2021 1601   RDW 11.9 11/02/2021 1132   LYMPHSABS 2.7 11/02/2021 1132   MONOABS 0.9 10/14/2021 1601   EOSABS 0.2 11/02/2021 1132   BASOSABS 0.1 11/02/2021 1132      Latest Ref Rng & Units 11/02/2021   11:32 AM 10/14/2021    4:01 PM 04/26/2021    7:24 PM  CMP  Glucose 70 - 99 mg/dL 390  300  923   BUN 6 - 20 mg/dL 13  15  8    Creatinine 0.57 - 1.00 mg/dL  3.00  7.62   Sodium 134 - 144 mmol/L 140  139  139   Potassium 3.5 - 5.2 mmol/L 4.3  3.7  3.6   Chloride 96 - 106 mmol/L 103  103  103   CO2 20 - 29 mmol/L 23  26  27    Calcium 8.7 - 10.2 mg/dL 9.5  9.2  9.6   Total Protein 6.0 - 8.5 g/dL 7.0  7.3  7.3   Total Bilirubin 0.0 - 1.2 mg/dL 0.2  0.3  0.3   Alkaline Phos 44 - 121 IU/L 89  76  92   AST 0 - 40 IU/L 16  16  23   ALT 0 - 32 IU/L 24  21  30       IMAGING:  CTH 2009 unremarkable  Imaging independently reviewed on November 27, 2021   Current Outpatient Medications on File Prior to Visit  Medication Sig Dispense Refill   esomeprazole (NEXIUM) 40 MG capsule One tab by mouth at dinner time. 90 capsule 3   levocetirizine (XYZAL) 5 MG tablet Take 1 tablet (5 mg total) by mouth every evening. 30 tablet 5   norethindrone-ethinyl estradiol-FE (LOESTRIN FE 1/20) 1-20 MG-MCG tablet Take 1 tablet by mouth daily. 84 tablet 3   ondansetron (ZOFRAN) 8 MG tablet Take 1 tablet (8 mg total) by mouth every 8 (eight) hours as needed for nausea or vomiting. 20 tablet 2   rizatriptan (MAXALT) 10 MG tablet Take 1 tablet (10 mg total) by mouth as needed for migraine. May repeat in 2 hours if needed 12 tablet 3   Vitamin D, Ergocalciferol, (DRISDOL) 1.25 MG (50000 UNIT) CAPS capsule Take 1 capsule (50,000 Units total) by mouth every 7 (seven) days. Take for 12 total doses(weeks) than can transition to 1000 units OTC supplement daily 12 capsule 0   No current facility-administered medications on file prior to visit.      Allergies: Allergies  Allergen Reactions   Zithromax [Azithromycin] Itching and Rash    Family History: Migraine or other headaches in the family:  mom, father Aneurysms in a first degree relative:  none Brain tumors in the family:  none Other neurological illness in the family:   none  Past Medical History: Past Medical History:  Diagnosis Date   ADHD (attention deficit hyperactivity disorder)    Migraine     Past Surgical History Past Surgical History:  Procedure Laterality Date   TONSILLECTOMY      Social History: Social History   Tobacco Use   Smoking status: Never   Smokeless tobacco: Never  Substance Use Topics   Alcohol use: No   Drug use: No    ROS: Negative for fevers, chills. Positive for headaches. All other systems reviewed and negative unless stated otherwise in HPI.   Physical Exam:   Vital Signs: BP 122/69   Pulse 85   Ht 5' (1.524 m)   Wt 186 lb 3.2 oz (84.5 kg)   BMI 36.36 kg/m  GENERAL: well appearing,in no acute distress,alert SKIN:  Color, texture, turgor normal. No rashes or lesions HEAD:  Normocephalic/atraumatic. CV:  RRR RESP: Normal respiratory effort MSK: +tenderness to palpation over right occiput. Slightly decreased range of motion turning head to the right  NEUROLOGICAL: Mental Status: Alert, oriented to person, place and time,Follows commands Cranial Nerves: PERRL, visual fields intact to confrontation, extraocular movements intact, facial sensation intact, no facial droop or ptosis, hearing grossly intact, no dysarthria Motor: muscle strength 5/5 both upper and lower extremities,no drift, normal tone Reflexes: 2+ throughout Sensation: intact to light touch all 4 extremities Coordination: Finger-to- nose-finger intact bilaterally Gait: normal-based   IMPRESSION: 21 year old female with a history of GERD, ADD, anxiety, migraines who presents for evaluation of right sided stabbing headaches. Her exam is suggestive  of right occipital neuralgia. Will refer to neck PT and start Cymbalta for headache prevention, which may also help her migraine headaches and anxiety.   PLAN: -Prevention: Start Cymbalta 20 mg daily -Referral to neck PT -Next steps: consider PRN muscle relaxer, gabapentin, occipital nerve block   I spent a total of 30 minutes chart reviewing and counseling the  patient. Headache education was done. Discussed treatment options including preventive and acute medications, and physical therapy. Discussed medication side effects, adverse reactions and drug interactions. Written educational materials and patient instructions outlining all of the above were given.  Follow-up: 6 months   Ocie Doyne, MD 11/27/2021   11:44 AM

## 2021-11-27 ENCOUNTER — Encounter: Payer: Self-pay | Admitting: Psychiatry

## 2021-11-27 ENCOUNTER — Ambulatory Visit (INDEPENDENT_AMBULATORY_CARE_PROVIDER_SITE_OTHER): Payer: Medicaid Other | Admitting: Psychiatry

## 2021-11-27 VITALS — BP 122/69 | HR 85 | Ht 60.0 in | Wt 186.2 lb

## 2021-11-27 DIAGNOSIS — M5481 Occipital neuralgia: Secondary | ICD-10-CM

## 2021-11-27 DIAGNOSIS — M542 Cervicalgia: Secondary | ICD-10-CM | POA: Diagnosis not present

## 2021-11-27 MED ORDER — DULOXETINE HCL 20 MG PO CPEP: 20.0000 mg | ORAL_CAPSULE | Freq: Every day | ORAL | 6 refills | Status: DC

## 2021-11-27 NOTE — Patient Instructions (Addendum)
Physical therapy for the neck Start Cymbalta 20 mg daily for headache prevention  Occipital neuralgia Your headaches are consistent with occipital neuralgia.This is caused by irritation of the occipital nerve, which travels between your neck muscles and provides sensation to your scalp. Pain is often described as a sharp, shooting pain at the back of the head which travels to the scalp and behind the eye. Other symptoms include tenderness at the base of the head, neck pain, and a sensation of numbness, tingling, or crawling on the scalp. It is often associated with neck tension. When neck muscles are tight they can compress the nerve and cause irritation.   Treatment options include physical therapy for neck pain and tightness. We can also use medications for neuropathic pain (pain originating from a nerve) as well as muscle relaxers. We can also perform an occipital nerve block, which is an injection of steroids and a numbing medication in the back of the head. This can be performed every 3 months.

## 2021-12-02 ENCOUNTER — Telehealth: Payer: Self-pay | Admitting: Psychiatry

## 2021-12-02 NOTE — Telephone Encounter (Signed)
Referral for Physical Therapy sent to [facility] at [facility] [phone].

## 2021-12-03 ENCOUNTER — Ambulatory Visit (HOSPITAL_BASED_OUTPATIENT_CLINIC_OR_DEPARTMENT_OTHER): Payer: Medicaid Other | Admitting: Nurse Practitioner

## 2021-12-03 ENCOUNTER — Other Ambulatory Visit (HOSPITAL_COMMUNITY)
Admission: RE | Admit: 2021-12-03 | Discharge: 2021-12-03 | Disposition: A | Discharge status: Home / Self Care | Payer: Medicaid Other | Source: Ambulatory Visit | Attending: Nurse Practitioner | Admitting: Nurse Practitioner

## 2021-12-03 ENCOUNTER — Encounter (HOSPITAL_BASED_OUTPATIENT_CLINIC_OR_DEPARTMENT_OTHER): Payer: Self-pay | Admitting: Nurse Practitioner

## 2021-12-03 VITALS — BP 120/58 | HR 85 | Temp 97.6°F | Ht 60.0 in | Wt 179.4 lb

## 2021-12-03 DIAGNOSIS — F339 Major depressive disorder, recurrent, unspecified: Secondary | ICD-10-CM

## 2021-12-03 DIAGNOSIS — Z124 Encounter for screening for malignant neoplasm of cervix: Secondary | ICD-10-CM | POA: Diagnosis present

## 2021-12-03 DIAGNOSIS — F988 Other specified behavioral and emotional disorders with onset usually occurring in childhood and adolescence: Secondary | ICD-10-CM | POA: Diagnosis not present

## 2021-12-03 DIAGNOSIS — R7303 Prediabetes: Secondary | ICD-10-CM

## 2021-12-03 DIAGNOSIS — R002 Palpitations: Secondary | ICD-10-CM

## 2021-12-03 DIAGNOSIS — F411 Generalized anxiety disorder: Secondary | ICD-10-CM

## 2021-12-03 MED ORDER — LISDEXAMFETAMINE DIMESYLATE 20 MG PO CAPS: 20.0000 mg | ORAL_CAPSULE | Freq: Every day | ORAL | 0 refills | Status: DC

## 2021-12-03 MED ORDER — METHYLPHENIDATE HCL 10 MG PO TABS: 10.0000 mg | ORAL_TABLET | Freq: Every day | ORAL | 0 refills | Status: DC

## 2021-12-03 NOTE — Progress Notes (Signed)
Shawna Clamp, DNP, AGNP-c [facility] [address] [place], [place] [address] [phone] Office [phone] Fax  ESTABLISHED PATIENT- Chronic Health and/or Follow-Up Visit  Blood pressure (!) 120/58, pulse 85, temperature 97.6 F (36.4 C), temperature source Oral, height 5' (1.524 m), weight 179 lb 6.4 oz (81.4 kg), SpO2 99 %.  Follow-up (Pt here for f/u on adhd and pap smear )   HPI  Alyssa Bennett  is a 21 y.o. year old female presenting today for evaluation and management of the following: ADHD Historically has been on medications in the past, but stop these at 11  She does not feel that these made her anxiety worse in the past She has only been on ritalin and vyvanse since starting ADHD medication and this has been helpful for her in the past Started duloxetine for neurological problems  Palpitations Intermittent with no shortness of breath, chest pain, dizziness, syncope Ongoing for years Not getting worse Can feel them at times when lying down Seem to be more noticeable with rest Would like to start exercising, but wants to make sure this is not a concerning factor.   ROS All ROS negative with exception of what is listed in HPI  PHYSICAL EXAM Physical Exam Vitals and nursing note reviewed.  Constitutional:      General: She is not in acute distress.    Appearance: Normal appearance.  HENT:     Head: Normocephalic.  Eyes:     Extraocular Movements: Extraocular movements intact.     Conjunctiva/sclera: Conjunctivae normal.     Pupils: Pupils are equal, round, and reactive to light.  Neck:     Vascular: No carotid bruit.  Cardiovascular:     Rate and Rhythm: Normal rate and regular rhythm.     Pulses: Normal pulses.     Heart sounds: Normal heart sounds. No murmur heard.    No friction rub. No gallop.  Pulmonary:     Effort: Pulmonary effort is normal.     Breath sounds: Normal breath sounds. No wheezing.   Abdominal:     General: Bowel sounds are normal. There is no distension.     Palpations: Abdomen is soft.     Tenderness: There is no abdominal tenderness. There is no guarding.  Musculoskeletal:        General: Normal range of motion.     Cervical back: Normal range of motion and neck supple.     Right lower leg: No edema.     Left lower leg: No edema.  Lymphadenopathy:     Cervical: No cervical adenopathy.  Skin:    General: Skin is warm and dry.     Capillary Refill: Capillary refill takes less than 2 seconds.  Neurological:     General: No focal deficit present.     Mental Status: She is alert and oriented to person, place, and time.  Psychiatric:        Mood and Affect: Mood normal.        Behavior: Behavior normal.        Thought Content: Thought content normal.        Judgment: Judgment normal.     ASSESSMENT & PLAN Problem List Items Addressed This Visit     Attention deficit disorder (ADD) without hyperactivity - Primary    Chronic ADHD with previous management with vyvanse and ritalin. She has not been able to get in with psychiatry at this time. Her anxiety is currently  well controlled. Based on these factors we will trial restart of Vyvanse and Ritalin for ADHD and monitor closely. If any worsening of anxiety presents, we will consider change in medication therapy. Patient is agreeable to plan. PDMP reviewed. Medications sent to pharmacy. F/U in 3-4 weeks for evaluation and further management.       Relevant Medications   methylphenidate (RITALIN) 10 MG tablet   Other Relevant Orders   Ambulatory referral to Psychology   Generalized anxiety disorder    Chronic. Well controlled at this time. No changes needed. Monitor closely for worsening symptoms following start of ADHD medication.       Relevant Orders   Ambulatory referral to Psychology   Palpitations    Intermittent palpitations of unknown etiology. At this time etiology unknown. She has had these for  quite some time, which could be contributed to underlying anxiety. They do seem to appear with and without medication for ADHD, therefore this is unlikely a source. I do feel she would benefit from cardiology evaluation for monitoring and recommendations given her age and the symptoms present. No alarm sx present at this time. Will send referral today.       Other Visit Diagnoses     Pre-diabetes       Relevant Orders   Referral to Nutrition and Diabetes Services   POCT glycosylated hemoglobin (Hb A1C)   Hemoglobin A1c (Completed)   Ambulatory referral to Psychology   Cervical cancer screening       Relevant Orders   Cytology - PAP (Completed)   Depression, recurrent (HCC)       Relevant Orders   Ambulatory referral to Psychology        FOLLOW-UP Return in about 4 weeks (around 12/31/2021) for 3-4 weeks -phone to check on new adhd med (end of day).  Shawna Clamp, DNP, AGNP-c 12/03/2021  1:24 PM

## 2021-12-03 NOTE — Patient Instructions (Addendum)
It was a pleasure seeing you today. I hope your time spent with [place] was pleasant and helpful. Please let us know if there is anything we can do to improve the service you receive.    You are ok to exercise regularly. If you begin to feel chest pain, dizziness, shortness of breath that does not improve with rest, nausea, or abdominal pain, please stop immediately and seek assistance.     Important Office Information Lab Results If labs were ordered, please note that you will see results through MyChart as soon as they come available from [facility].  It takes up to 5 business days for the results to be routed to me and for me to review them once all of the lab results have come through from [facility]. I will make recommendations based on your results and send these through MyChart or someone from the office will call you to discuss. If your labs are abnormal, we may contact you to schedule a visit to discuss the results and make recommendations.  If you have not heard from [place] within 5 business days or you have waited longer than a week and your lab results have not come through on MyChart, please feel free to call the office or send a message through MyChart to follow-up on these labs.   Referrals If referrals were placed today, the office where the referral was sent will contact you either by phone or through MyChart to set up scheduling. Please note that it can take up to a week for the referral office to contact you. If you do not hear from them in a week, please contact the referral office directly to inquire about scheduling.   Condition Treated If your condition worsens or you begin to have new symptoms, please schedule a follow-up appointment for further evaluation. If you are not sure if an appointment is needed, you may call the office to leave a message for the nurse and someone will contact you with recommendations.  If you have an urgent or life threatening emergency, please do not call the  office, but seek emergency evaluation by calling 911 or going to the nearest emergency room for evaluation.   MyChart and Phone Calls Please do not use MyChart for urgent messages. It may take up to 3 business days for MyChart messages to be read by staff and if they are unable to handle the request, an additional 3 business days for them to be routed to me and for my response.  Messages sent to the provider through MyChart do not come directly to the provider, please allow time for these messages to be routed and for me to respond.  We get a large volume of MyChart messages daily and these are responded to in the order received.   For urgent messages, please call the office at [phone] and speak with the front office staff or leave a message on the line of my assistant for guidance.  We are seeing patients from the hours of 8:00 am through 5:00 pm and calls directly to the nurse may not be answered immediately due to seeing patients, but your call will be returned as soon as possible.  Phone  messages received after 4:00 PM Monday through Thursday may not be returned until the following business day. Phone messages received after 11:00 AM on Friday may not be returned until Monday.   After Hours We share on call hours with providers from other offices. If you have an urgent  need after hours that cannot wait until the next business day, please contact the on call provider by calling the office number. A nurse will speak with you and contact the provider if needed for recommendations.  If you have an urgent or life threatening emergency after hours, please do not call the on call provider, but seek emergency evaluation by calling 911 or going to the nearest emergency room for evaluation.   Paperwork All paperwork requires a minimum of 5 days to complete and return to you or the designated personnel. Please keep this in mind when bringing in forms or sending requests for paperwork completion to the  office.

## 2021-12-04 DIAGNOSIS — R002 Palpitations: Secondary | ICD-10-CM

## 2021-12-04 HISTORY — DX: Palpitations: R00.2

## 2021-12-04 LAB — HEMOGLOBIN A1C
Est. average glucose Bld gHb Est-mCnc: 120 mg/dL
Hgb A1c MFr Bld: 5.8 % — ABNORMAL HIGH (ref 4.8–5.6)

## 2021-12-08 LAB — CYTOLOGY - PAP
Adequacy: ABSENT
Chlamydia: NEGATIVE
Comment: NEGATIVE
Comment: NEGATIVE
Comment: NEGATIVE
Comment: NORMAL
Diagnosis: NEGATIVE
HSV1: NEGATIVE
HSV2: NEGATIVE
Neisseria Gonorrhea: NEGATIVE
Trichomonas: NEGATIVE

## 2021-12-10 ENCOUNTER — Ambulatory Visit (HOSPITAL_BASED_OUTPATIENT_CLINIC_OR_DEPARTMENT_OTHER): Payer: Medicaid Other | Admitting: Nurse Practitioner

## 2021-12-10 ENCOUNTER — Ambulatory Visit (HOSPITAL_BASED_OUTPATIENT_CLINIC_OR_DEPARTMENT_OTHER): Payer: Medicaid Other | Attending: Nurse Practitioner | Admitting: Physical Therapy

## 2021-12-10 ENCOUNTER — Encounter (HOSPITAL_BASED_OUTPATIENT_CLINIC_OR_DEPARTMENT_OTHER): Payer: Self-pay | Admitting: Physical Therapy

## 2021-12-10 DIAGNOSIS — M542 Cervicalgia: Secondary | ICD-10-CM | POA: Insufficient documentation

## 2021-12-10 DIAGNOSIS — M5481 Occipital neuralgia: Secondary | ICD-10-CM | POA: Diagnosis not present

## 2021-12-10 DIAGNOSIS — M62838 Other muscle spasm: Secondary | ICD-10-CM | POA: Diagnosis not present

## 2021-12-11 NOTE — Therapy (Signed)
OUTPATIENT PHYSICAL THERAPY CERVICAL EVALUATION   Patient Name: Alyssa Bennett MRN: [id number] DOB:08-07-2000, 21 y.o., female Today's Date: 12/11/2021   PT End of Session - 12/11/21 1104     Visit Number 1    Number of Visits 13    Date for PT Re-Evaluation 01/22/22    PT Start Time 0800    PT Stop Time 0843    PT Time Calculation (min) 43 min    Activity Tolerance Patient tolerated treatment well    Behavior During Therapy [facility] for tasks assessed/performed             Past Medical History:  Diagnosis Date   ADHD (attention deficit hyperactivity disorder)    Migraine    Past Surgical History:  Procedure Laterality Date   TONSILLECTOMY     Patient Active Problem List   Diagnosis Date Noted   Intermittent palpitations 12/04/2021   Stabbing headache 11/09/2021   Gastroesophageal reflux disease 11/09/2021   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   SUBJECTIVE:                                                                                                                                                                                                          SUBJECTIVE STATEMENT: Patient presents with c/c of R sided headaches and neck pain. She has been having headaches and pain since she was 21 years old and were on and off, but they have been more frequent in the past several months. She  has several R-sided headaches a week (usually 2-4) that are severe along the temporalis and into R eye and last about 5-10 minutes. They occur usually with neck and suboccipital pain - she has two particular concordant spots (one in R cervical musculature at C3/4, one in R temporalis). Headaches feel random, no particular motions or activities that bring them on. 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   PERTINENT  HISTORY:  Anxiety, ADHD   PAIN:  Are you having pain? Yes: NPRS scale: 7/10 at worst with headache. 1/10 neck pain (stiffness and tightness) at eval Pain location: R neck and headache Pain description: Sharp, squeezing, throbbing Aggravating factors: Spontaneous headache no particular motions assosciated Relieving factors: Migraine relief strategies - dark room, gentle pressure over temporalis   PRECAUTIONS: None   WEIGHT BEARING RESTRICTIONS No   FALLS:  Has patient fallen in last 6 months? No   LIVING ENVIRONMENT: Lives with: lives alone Lives in: House/apartment   OCCUPATION: Retail   PLOF: Independent   PATIENT GOALS   Reduce frequency/severity of headaches, improve neck range of motion   OBJECTIVE:    DIAGNOSTIC FINDINGS:  No recent imaging.   PATIENT SURVEYS:  Deferred     COGNITION: Overall cognitive status: Within functional limits for tasks assessed     SENSATION: WFL   POSTURE: rounded shoulders and forward head   PALPATION: Tender to palpation of generalized R cervical musculature. Concordant pain with trigger points at C3/4 in UT and in temporalis    CERVICAL ROM:    Active ROM AROM (deg) eval  Flexion WFL  Extension WFL  Right lateral flexion Full, pain on R/stiff   Left lateral flexion Full, pain on R  Right rotation WFL  Left rotation WFL   (Blank rows = not tested)   UPPER EXTREMITY ROM:   Full active motion of the shoulders      Active ROM Right eval Left eval  Shoulder flexion [facility]   Shoulder extension      Shoulder abduction [facility]  Shoulder adduction      Shoulder extension      Shoulder internal rotation      Shoulder external rotation [facility] WFL  Middle trapezius      Lower trapezius      Elbow flexion      Elbow extension      Wrist flexion      Wrist extension      Wrist ulnar deviation      Wrist radial deviation      Wrist pronation      Wrist supination      Grip strength       (Blank rows = not tested)   Cervical MMT - All 4+/5. Concordant pain on the right with left side bending    TODAY'S TREATMENT:  Eval   Manual - soft tissue mobilization of R UT and cervical PS     PATIENT EDUCATION:  Education details: HEP, symptom management, benefits of PT, benefits and risks of TPDN,  Person educated: Patient Education method: Explanation, Demonstration, Tactile cues, Verbal cues, and Handouts Education comprehension: verbalized understanding     HOME EXERCISE PROGRAM: Access Code: [id number]     ASSESSMENT:   CLINICAL IMPRESSION: Patient is a 20 y.o. F who was seen today for physical therapy evaluation and treatment for cervical pain with headaches. She has symptoms consistent with referral dx of occipital neuralgia - tight cervical musculature and tenderness of the R temporalis. She presents with normal range of motion that does reproduce R sided pain at end range side bending and rotational movements. She may see improvement in pain from exercises to improve generalized posture and cervicothoracic stability. She will benefit from skilled therapy to improve pain-free ROM, strength, cervical stability, and decrease muscle spasms to improve overall quality of life and function.     OBJECTIVE IMPAIRMENTS decreased ROM, decreased strength, increased muscle spasms, and  pain.    ACTIVITY LIMITATIONS  when pain comes on it can effect whatever activity she is doing    PARTICIPATION LIMITATIONS:  any activity she perfoming at the time   PERSONAL FACTORS  1-2 comorbidities: ADHD, history of migraines   are also affecting patient's functional outcome.    REHAB POTENTIAL: Good   CLINICAL DECISION MAKING: Evolving/moderate complexity increasing frequency of headaches that effect her ability to perform ADL'S   EVALUATION COMPLEXITY: Moderate     GOALS: Goals reviewed with patient? Yes   SHORT TERM GOALS: Target date: 8/18    Patient will improve cervical AROM to full and pain-free. Baseline:  Goal status: INITIAL   2.  Patient will improve generalized cervical strength to 5/5 bil. Baseline:  Goal status: INITIAL   3.  Patient will report having no cervical pain at rest. Baseline:  Goal status: INITIAL   LONG TERM GOALS: Target date: 9/15   Patient will report a decrease in weekly headache frequency to 1 or less. Baseline:  Goal status: INITIAL   2.  Patient will report a decrease in headache severity to 5/10 NPRS to indicate MCID. Baseline:  Goal status: INITIAL   3.  Patient will be independent with gym/exercise program to maintain and improve current level of fitness. Baseline:  Goal status: INITIAL       PLAN: PT FREQUENCY: 1-2x/week   PT DURATION: 6 weeks   PLANNED INTERVENTIONS: Therapeutic exercises, Therapeutic activity, Neuromuscular re-education, Balance training, Gait training, Patient/Family education, Self Care, Joint mobilization, Dry Needling, Cryotherapy, and Moist heat   PLAN FOR NEXT SESSION: Dry needling and soft tissue for TrP. Cervical strength/stability exercises, postural stability.     Dessie Coma, PT 12/11/2021, 12:51 PM  [place] SPT  12/11/2021  During this treatment session, the therapist was present, participating in and directing the treatment.

## 2021-12-16 ENCOUNTER — Encounter (HOSPITAL_BASED_OUTPATIENT_CLINIC_OR_DEPARTMENT_OTHER): Payer: Self-pay | Admitting: Physical Therapy

## 2021-12-16 ENCOUNTER — Ambulatory Visit (HOSPITAL_BASED_OUTPATIENT_CLINIC_OR_DEPARTMENT_OTHER): Payer: Medicaid Other | Admitting: Physical Therapy

## 2021-12-16 DIAGNOSIS — M62838 Other muscle spasm: Secondary | ICD-10-CM

## 2021-12-16 DIAGNOSIS — M542 Cervicalgia: Secondary | ICD-10-CM

## 2021-12-16 NOTE — Therapy (Signed)
OUTPATIENT PHYSICAL THERAPY CERVICAL Treatment    Patient Name: Alyssa Bennett MRN: [id number] DOB:12/12/2000, 21 y.o., female Today's Date: 12/16/2021   PT End of Session - 12/16/21 0851     Visit Number 2    Number of Visits 13    Date for PT Re-Evaluation 01/22/22    PT Start Time 0802    PT Stop Time 0845    PT Time Calculation (min) 43 min    Activity Tolerance Patient tolerated treatment well    Behavior During Therapy [facility] for tasks assessed/performed              Past Medical History:  Diagnosis Date   ADHD (attention deficit hyperactivity disorder)    Migraine    Past Surgical History:  Procedure Laterality Date   TONSILLECTOMY     Patient Active Problem List   Diagnosis Date Noted   Intermittent palpitations 12/04/2021   Stabbing headache 11/09/2021   Gastroesophageal reflux disease 11/09/2021   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   SUBJECTIVE:                                                                                                                                                                                                          SUBJECTIVE STATEMENT: The patient reports her general soreness is a bit more consistent but she has only had one incident of the sharp pain. Even that instance was less intense. She has been working on her exercises but has not  gotten  the thera-cane yet.  PERTINENT HISTORY:  Anxiety, ADHD   PAIN:  Are you having pain? Yes: NPRS scale: 7/10 at worst with headache. 1/10 neck pain (stiffness and tightness) at eval Pain location: R neck and headache Pain description: Sharp, squeezing, throbbing Aggravating factors: Spontaneous headache no particular motions assosciated Relieving factors: Migraine relief strategies - dark room, gentle pressure over temporalis   PRECAUTIONS: None   WEIGHT BEARING RESTRICTIONS No   FALLS:  Has patient fallen in last 6 months? No   LIVING ENVIRONMENT: Lives with: lives alone Lives in: House/apartment   OCCUPATION: Retail   PLOF: Independent   PATIENT GOALS   Reduce frequency/severity of headaches, improve neck range of motion   OBJECTIVE:    DIAGNOSTIC FINDINGS:  No recent imaging.   PATIENT SURVEYS:  Deferred     COGNITION: Overall cognitive status: Within functional limits for tasks assessed     SENSATION: WFL   POSTURE: rounded shoulders and forward head   PALPATION: Tender to palpation of generalized R cervical musculature. Concordant pain with trigger points at C3/4 in UT and in temporalis    CERVICAL ROM:    Active ROM AROM (deg) eval  Flexion WFL  Extension WFL  Right lateral flexion Full, pain on R/stiff   Left lateral flexion Full, pain on R  Right rotation WFL  Left rotation WFL   (Blank rows = not tested)   UPPER EXTREMITY ROM:   Full active motion of the shoulders      Active ROM Right eval Left eval  Shoulder flexion [facility]  Shoulder extension      Shoulder abduction [facility]  Shoulder adduction      Shoulder extension      Shoulder internal rotation      Shoulder external rotation [facility] WFL  Middle trapezius      Lower trapezius      Elbow flexion      Elbow extension      Wrist flexion      Wrist extension      Wrist ulnar deviation      Wrist radial deviation      Wrist pronation      Wrist supination      Grip  strength       (Blank rows = not tested)   Cervical MMT - All 4+/5. Concordant pain on the right with left side bending    TODAY'S TREATMENT:  7/26 Trigger Point Dry-Needling  Treatment instructions: Expect mild to moderate muscle soreness. S/S of pneumothorax if dry needled over a lung field, and to seek immediate medical attention should they occur. Patient verbalized understanding of these instructions and education.  Patient Consent Given: Yes Education handout provided: Yes Muscles treated: 1 spot in right upper trap using a .30x50 needle and 1 spot in the c3 right using a .30x30 needle  Electrical stimulation performed: No Parameters:    Treatment response/outcome:  great twitch palpable increase in muscle length  Reviewed how to use stretches for post needle soreness  Manual: sub-occipital release; trigger point release to upper trap; trigger point release to cervical para spinals Right UPA in t30 degrees of rotation.    Eval   Manual - soft tissue mobilization of R UT and cervical PS     PATIENT EDUCATION:  Education details: HEP, symptom management, benefits of PT, benefits and risks of TPDN,  Person educated: Patient Education method: Explanation, Demonstration, Tactile cues, Verbal cues, and Handouts Education comprehension: verbalized understanding     HOME EXERCISE PROGRAM: Access Code: [id number]     ASSESSMENT:   CLINICAL IMPRESSION: The patient had a great twitch to both needles. We only needled 2 spots since her muscle was so reactive. She was advised that post needle soreness is typical. She had a reproduction of her symptoms with UPA's at 30 degrees of rotation. She  was given light posterior chain strengthening to work on at home. She was given an updated  HEP. Therapy will needle her sub-occipitals next if she tolerates her other needling well.    OBJECTIVE IMPAIRMENTS decreased ROM, decreased strength, increased muscle spasms, and pain.    ACTIVITY  LIMITATIONS  when pain comes on it can effect whatever activity she is doing    PARTICIPATION LIMITATIONS:  any activity she perfoming at the time   PERSONAL FACTORS 1-2 comorbidities: ADHD, history of migraines   are also affecting patient's functional outcome.    REHAB POTENTIAL: Good   CLINICAL DECISION MAKING: Evolving/moderate complexity increasing frequency of headaches that effect her ability to perform ADL'S   EVALUATION COMPLEXITY: Moderate     GOALS: Goals reviewed with patient? Yes   SHORT TERM GOALS: Target date: 8/18    Patient will improve cervical AROM to full and pain-free. Baseline:  Goal status: INITIAL   2.  Patient will improve generalized cervical strength to 5/5 bil. Baseline:  Goal status: INITIAL   3.  Patient will report having no cervical pain at rest. Baseline:  Goal status: INITIAL   LONG TERM GOALS: Target date: 9/15   Patient will report a decrease in weekly headache frequency to 1 or less. Baseline:  Goal status: INITIAL   2.  Patient will report a decrease in headache severity to 5/10 NPRS to indicate MCID. Baseline:  Goal status: INITIAL   3.  Patient will be independent with gym/exercise program to maintain and improve current level of fitness. Baseline:  Goal status: INITIAL       PLAN: PT FREQUENCY: 1-2x/week   PT DURATION: 6 weeks   PLANNED INTERVENTIONS: Therapeutic exercises, Therapeutic activity, Neuromuscular re-education, Balance training, Gait training, Patient/Family education, Self Care, Joint mobilization, Dry Needling, Cryotherapy, and Moist heat   PLAN FOR NEXT SESSION: Dry needling and soft tissue for TrP. Cervical strength/stability exercises, postural stability.     Dessie Coma, PT 12/16/2021, 4:20 PM

## 2021-12-24 ENCOUNTER — Other Ambulatory Visit: Payer: Self-pay | Admitting: Psychiatry

## 2021-12-25 ENCOUNTER — Encounter (HOSPITAL_BASED_OUTPATIENT_CLINIC_OR_DEPARTMENT_OTHER): Payer: Self-pay | Admitting: Physical Therapy

## 2021-12-25 ENCOUNTER — Ambulatory Visit (INDEPENDENT_AMBULATORY_CARE_PROVIDER_SITE_OTHER): Payer: Medicaid Other

## 2021-12-25 ENCOUNTER — Encounter (HOSPITAL_BASED_OUTPATIENT_CLINIC_OR_DEPARTMENT_OTHER): Payer: Self-pay | Admitting: Cardiovascular Disease

## 2021-12-25 ENCOUNTER — Ambulatory Visit (HOSPITAL_BASED_OUTPATIENT_CLINIC_OR_DEPARTMENT_OTHER): Payer: Medicaid Other | Admitting: Cardiovascular Disease

## 2021-12-25 ENCOUNTER — Ambulatory Visit (HOSPITAL_BASED_OUTPATIENT_CLINIC_OR_DEPARTMENT_OTHER): Payer: Medicaid Other | Attending: Nurse Practitioner | Admitting: Physical Therapy

## 2021-12-25 VITALS — BP 110/78 | HR 84 | Ht 60.0 in | Wt 181.5 lb

## 2021-12-25 DIAGNOSIS — R002 Palpitations: Secondary | ICD-10-CM

## 2021-12-25 DIAGNOSIS — Z01812 Encounter for preprocedural laboratory examination: Secondary | ICD-10-CM | POA: Diagnosis not present

## 2021-12-25 DIAGNOSIS — R0789 Other chest pain: Secondary | ICD-10-CM

## 2021-12-25 DIAGNOSIS — M62838 Other muscle spasm: Secondary | ICD-10-CM | POA: Diagnosis present

## 2021-12-25 DIAGNOSIS — M542 Cervicalgia: Secondary | ICD-10-CM | POA: Insufficient documentation

## 2021-12-25 HISTORY — DX: Other chest pain: R07.89

## 2021-12-25 MED ORDER — IVABRADINE HCL 5 MG PO TABS: ORAL_TABLET | ORAL | 0 refills | Status: DC

## 2021-12-25 NOTE — Progress Notes (Signed)
Cardiology Office Note:    Date:  12/25/2021   ID:  Alyssa MELLOTT, DOB 2000/08/17, MRN [id number]  PCP:  Tollie Eth, NP   [facility] Providers Cardiologist:  None     Referring MD: Tollie Eth, NP   No chief complaint on file.   History of Present Illness:    Alyssa Bennett is a 21 y.o. female with ADHD and tourettes syndrome  who is being seen today for the evaluation of palpitations at the request of Early, [name], NP. She reported years of palpitations and was referred to cardiology.   Today, she says her chest flutters "feel like her heart stops and starts again, gets really fast, then evens out again." She recalls having these flutters since the age of 73. She reports that it happens at least once every week, but usually multiple days. She states that it seems to coincide with the anxiety and panic attacks she experiences. Her palpitations have also been associated with shortness of breath and lightheadedness. She notices her palpitations more when she is on her ADHD medications, but she believes it is because she becomes more clear-headed and is able to notice them.  She carried a chair downstairs earlier today and felt the fluttering sensation. The sensations tend to last a few seconds, but a minute or two maximum. Additionally, she states that she has gastrointestinal issues, which can irritate her chest discomfort and palpitations. She has not been doing cardio exercise, due to her palpitations occurring more frequently with that. She does weighted exercises instead. She has decreased the caffeine in her diet. Since she has ADHD and has gotten back on her medication recently. She does not notice extra palpitations when she drinks caffeine. She does not smoke or drink alcohol. She denies any peripheral edema. No headaches, syncope, orthopnea, or PND.   Past Medical History:  Diagnosis Date   ADHD (attention deficit hyperactivity disorder)    Atypical chest pain  12/25/2021   Migraine     Past Surgical History:  Procedure Laterality Date   TONSILLECTOMY      Current Medications: Current Meds  Medication Sig   DULoxetine (CYMBALTA) 20 MG capsule TAKE 1 CAPSULE BY MOUTH EVERY DAY   esomeprazole (NEXIUM) 40 MG capsule One tab by mouth at dinner time.   ivabradine (CORLANOR) 5 MG TABS tablet TAKE 2 TABLETS 2 HOURS PRIOR TO CT   levocetirizine (XYZAL) 5 MG tablet Take 1 tablet (5 mg total) by mouth every evening.   lisdexamfetamine (VYVANSE) 20 MG capsule Take 1 capsule (20 mg total) by mouth daily.   methylphenidate (RITALIN) 10 MG tablet Take 1 tablet (10 mg total) by mouth daily in the afternoon.   norethindrone-ethinyl estradiol-FE (LOESTRIN FE 1/20) 1-20 MG-MCG tablet Take 1 tablet by mouth daily.   ondansetron (ZOFRAN) 8 MG tablet Take 1 tablet (8 mg total) by mouth every 8 (eight) hours as needed for nausea or vomiting.   rizatriptan (MAXALT) 10 MG tablet Take 1 tablet (10 mg total) by mouth as needed for migraine. May repeat in 2 hours if needed   Vitamin D, Ergocalciferol, (DRISDOL) 1.25 MG (50000 UNIT) CAPS capsule Take 1 capsule (50,000 Units total) by mouth every 7 (seven) days. 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 She enjoys music, science and hanging with friends and family    Caffeine- average 2 a day, used to drink more caffeine   Social Determinants of Health   Financial Resource Strain: Low Risk  (12/25/2021)   Overall Financial Resource Strain (CARDIA)    Difficulty of Paying Living Expenses: Not hard at all  Food Insecurity: No Food Insecurity (12/25/2021)   Hunger Vital Sign    Worried About Running Out of Food in the Last Year: Never true    Ran Out of Food in the Last Year: Never true  Transportation Needs: No Transportation Needs (12/25/2021)   PRAPARE - Administrator, Civil Service (Medical): No    Lack of Transportation (Non-Medical): No  Physical Activity: Inactive (12/25/2021)   Exercise Vital Sign    Days of Exercise per Week: 0 days    Minutes of Exercise per Session: 0 min  Stress: Not on file  Social Connections: Not on file     Family History: The patient's family history includes ADD / ADHD in her brother and mother; Anxiety disorder in her maternal grandmother and mother; Bradycardia in her father; Headache in her brother; Heart attack in her paternal grandmother; Heart attack (age of onset: 41) in her father; Migraines in her maternal grandmother and mother. There is no history of Seizures, Autism, Depression, Bipolar disorder, or Schizophrenia.  ROS:   Please see the history of present illness.  (+) Shortness of breath (+) Chest pain (+) Palpitations (+) Anxiety (+) Stress (+) Lightheadedness All other systems reviewed and are negative.  EKGs/Labs/Other Studies Reviewed:    The following studies were reviewed today:  CT Abdomen Pelvis 10/14/2021: FINDINGS: Lower chest: Lung bases are clear.   Hepatobiliary: No focal liver abnormality is seen. No gallstones, gallbladder wall thickening, or biliary dilatation. Mild diffuse fatty infiltration of the liver.   Pancreas: Unremarkable. No pancreatic ductal dilatation or surrounding inflammatory changes.   Spleen: Normal in size without focal abnormality.    Adrenals/Urinary Tract: Adrenal glands are unremarkable. Kidneys are normal, without renal calculi, focal lesion, or hydronephrosis. Bladder is unremarkable.   Stomach/Bowel: Stomach is within normal limits. Appendix appears normal. No evidence of bowel wall thickening, distention, or inflammatory changes.   Vascular/Lymphatic: No significant vascular findings are present. No enlarged abdominal or pelvic lymph nodes.   Reproductive: Uterus and bilateral adnexa are unremarkable.   Other: No abdominal wall hernia or abnormality. No abdominopelvic ascites.   Musculoskeletal: No acute or significant osseous findings.   IMPRESSION: 1. Mild diffuse fatty infiltration of the liver. 2. No acute process demonstrated in the abdomen or pelvis. No evidence of bowel obstruction or inflammation. Appendix is normal.    EKG: EKG is personally reviewed.  12/25/21: Sinus arrhythmia. Rate 84 bpm.   Recent Labs: 11/02/2021: ALT 24; BUN 13; Creatinine, Ser 0.51; Hemoglobin 14.7; Platelets 360; Potassium 4.3; Sodium 140; TSH 2.250  Recent Lipid Panel  Component Value Date/Time   CHOL 173 11/02/2021 1132   TRIG 153 (H) 11/02/2021 1132   HDL 46 11/02/2021 1132   CHOLHDL 3.8 11/02/2021 1132   LDLCALC 100 (H) 11/02/2021 1132    Physical Exam:    VS:  BP 110/78 (BP Location: Right Arm, Patient Position: Sitting, Cuff Size: Normal)   Pulse 84   Ht 5' (1.524 m)   Wt 181 lb 8 oz (82.3 kg)   BMI 35.45 kg/m     Wt Readings from Last 3 Encounters:  12/25/21 181 lb 8 oz (82.3 kg)  12/03/21 179 lb 6.4 oz (81.4 kg)  11/27/21 186 lb 3.2 oz (84.5 kg)    VS:  BP 110/78 (BP Location: Right Arm, Patient Position: Sitting, Cuff Size: Normal)   Pulse 84   Ht 5' (1.524 m)   Wt 181 lb 8 oz (82.3 kg)   BMI 35.45 kg/m  , BMI Body mass index is 35.45 kg/m. GENERAL:  Well appearing HEENT: Pupils equal round and reactive, fundi not visualized, oral mucosa unremarkable NECK:  No jugular venous  distention, waveform within normal limits, carotid upstroke brisk and symmetric, no bruits, no thyromegaly LUNGS:  Clear to auscultation bilaterally HEART:  RRR.  PMI not displaced or sustained,S1 and S2 within normal limits, no S3, no S4, no clicks, no rubs, no murmurs ABD:  Flat, positive bowel sounds normal in frequency in pitch, no bruits, no rebound, no guarding, no midline pulsatile mass, no hepatomegaly, no splenomegaly EXT:  2 plus pulses throughout, no edema, no cyanosis no clubbing SKIN:  No rashes no nodules NEURO:  Cranial nerves II through XII grossly intact, motor grossly intact throughout PSYCH:  Cognitively intact, oriented to person place and time   ASSESSMENT:    1. Pre-procedure lab exam   2. Palpitations   3. Atypical chest pain    PLAN:    In order of problems listed above:  Palpitations She has palpitations since age 39.  More frequent lately.  She has sinus arrhythmia on EKG.  She is unclear how much of this is related to her anxiety but it also occurs with physical activity.  Labs were unremarkable 10/2021.  We discussed the fact that her ADHD meds could be contributing.  However she needs them to function.  Blood pressures are too low for any beta-blockers.  We will get a 14-day ZIO monitor.  She is already working on limiting her caffeine intake.   Atypical chest pain She has chest pain that is related to her palpitations and her anxiety.  However she also has exertional component.  She has a family history of premature CAD in her father died at age 59 of a heart attack.  This was complicated by the fact that he also had drug use.  We will get a coronary CTA to better understand her chest pain and to cardiovascular risk.   Follow up: with APP in 1-2 months.  Medication Adjustments/Labs and Tests Ordered: Current medicines are reviewed at length with the patient today.  Concerns regarding medicines are outlined above.  Orders Placed This Encounter  Procedures    CT CORONARY MORPH W/CTA COR W/SCORE W/CA W/CM &/OR WO/CM   Basic metabolic panel   hCG, quantitative, pregnancy   LONG TERM MONITOR (3-14 DAYS)   EKG 12-Lead   Meds ordered this encounter  Medications   ivabradine (CORLANOR) 5 MG TABS tablet    Sig: TAKE 2 TABLETS 2 HOURS PRIOR TO CT    Dispense:  2 tablet    Refill:  0    Patient Instructions  Medication Instructions:  TAKE IVABRADINE 10 MG 2 HOURS PRIOR TO CARDIAC CT  *If you need a refill on your cardiac medications before your next appointment, please call your pharmacy*  Lab Work: LABS 1 WEEK PRIOR TO CARDIAC CT  If you have labs (blood work) drawn today and your tests are completely normal, you will receive your results only by: MyChart Message (if you have MyChart) OR A paper copy in the mail If you have any lab test that is abnormal or we need to change your treatment, we will call you to review the results.  Testing/Procedures: Your physician has requested that you have cardiac CT. Cardiac computed tomography (CT) is a painless test that uses an x-ray machine to take clear, detailed pictures of your heart. For further information please visit [email or web address] Please follow instruction sheet as given.  14 DA MONITOR, PLACED TODAY   Follow-Up: At [facility], you and your health needs are our priority.  As part of our continuing mission to provide you with exceptional heart care, we have created designated Provider Care Teams.  These Care Teams include your primary Cardiologist (physician) and Advanced Practice Providers (APPs -  Physician Assistants and Nurse Practitioners) who all work together to provide you with the care you need, when you need it.  We recommend signing up for the patient portal called "MyChart".  Sign up information is provided on this After Visit Summary.  MyChart is used to connect with patients for Virtual Visits (Telemedicine).  Patients are able to view lab/test results, encounter notes,  upcoming appointments, etc.  Non-urgent messages can be sent to your provider as well.   To learn more about what you can do with MyChart, go to [email or web address].    Your next appointment:   1-2  month(s)  The format for your next appointment:   In Person  Provider:   Gillian Shields, [facility]  Other Instructions    Your cardiac CT will be scheduled at one of the below locations:   [facility] [address] [place], [place] [address] [phone]  OR  [facility] [address] [place], [place] [address] [phone]  If scheduled at [facility], please arrive at the [facility] and Children's Entrance (Entrance C2) of [facility] 30 minutes prior to test start time. You can use the FREE valet parking offered at entrance C (encouraged to control the heart rate for the test)  Proceed to the [facility] Radiology Department (first floor) to check-in and test prep.  All radiology patients and guests should use entrance C2 at [facility], accessed from [facility], even though the hospital's physical address listed is [address]    If scheduled at [facility], please arrive 15 mins early for check-in and test prep.  Please follow these instructions carefully (unless otherwise directed):  On the Night Before the Test: Be sure to Drink plenty of water. Do not consume any caffeinated/decaffeinated beverages or chocolate 12 hours prior to your test. Do not take any antihistamines 12 hours prior to your test.  On the Day of the Test: Drink plenty of water until 1 hour prior to the test. Do not eat any food 4 hours prior to the test. You may take your regular medications prior to the test.  Take metoprolol (Lopressor) two hours prior to test. HOLD  Furosemide/Hydrochlorothiazide morning of the test. FEMALES- please wear underwire-free bra if  available, avoid dresses & tight clothing      After the Test: Drink plenty of water. After receiving IV contrast, you may experience a mild flushed feeling. This is normal. On occasion, you may experience a mild rash up to 24 hours after the test. This is not dangerous. If this occurs, you can take Benadryl 25 mg and increase your fluid intake. If you experience trouble breathing, this can be serious. If it is severe call 911 IMMEDIATELY. If it is mild, please call our office. If you take any of these medications: Glipizide/Metformin, Avandament, Glucavance, please do not take 48 hours after completing test unless otherwise instructed.  We will call to schedule your test 2-4 weeks out understanding that some insurance companies will need an authorization prior to the service being performed.   For non-scheduling related questions, please contact the cardiac imaging nurse navigator should you have any questions/concerns: Rockwell Alexandria, Cardiac Imaging Nurse Navigator Larey Brick, Cardiac Imaging Nurse Navigator [facility] Direct Office Dial: [phone]   For scheduling needs, including cancellations and rescheduling, please call [place], [phone].  Cardiac CT Angiogram A cardiac CT angiogram is a procedure to look at the heart and the area around the heart. It may be done to help find the cause of chest pains or other symptoms of heart disease. During this procedure, a substance called contrast dye is injected into the blood vessels in the area to be checked. A large X-ray machine, called a CT scanner, then takes detailed pictures of the heart and the surrounding area. The procedure is also sometimes called a coronary CT angiogram, coronary artery scanning, or CTA. A cardiac CT angiogram allows the health care provider to see how well blood is flowing to and from the heart. The health care provider will be able to see if there are any problems, such  as: Blockage or narrowing of the coronary arteries in the heart. Fluid around the heart. Signs of weakness or disease in the muscles, valves, and tissues of the heart. Tell a health care provider about: Any allergies you have. This is especially important if you have had a previous allergic reaction to contrast dye. All medicines you are taking, including vitamins, herbs, eye drops, creams, and over-the-counter medicines. Any blood disorders you have. Any surgeries you have had. Any medical conditions you have. Whether you are pregnant or may be pregnant. Any anxiety disorders, chronic pain, or other conditions you have that may increase your stress or prevent you from lying still. What are the risks? Generally, this is a safe procedure. However, problems may occur, including: Bleeding. Infection. Allergic reactions to medicines or dyes. Damage to other structures or organs. Kidney damage from the contrast dye that is used. Increased risk of cancer from radiation exposure. This risk is low. Talk with your health care provider about: The risks and benefits of testing. How you can receive the lowest dose of radiation. What happens before the procedure? Wear comfortable clothing and remove any jewelry, glasses, dentures, and hearing aids. Follow instructions from your health care provider about eating and drinking. This may include: For 12 hours before the procedure -- avoid caffeine. This includes tea, coffee, soda, energy drinks, and diet pills. Drink plenty of water or other fluids that do not have caffeine in them. Being well hydrated can prevent complications. For 4-6 hours before the procedure -- stop eating and drinking. The contrast dye  can cause nausea, but this is less likely if your stomach is empty. Ask your health care provider about changing or stopping your regular medicines. This is especially important if you are taking diabetes medicines, blood thinners, or medicines to  treat problems with erections (erectile dysfunction). What happens during the procedure?  Hair on your chest may need to be removed so that small sticky patches called electrodes can be placed on your chest. These will transmit information that helps to monitor your heart during the procedure. An IV will be inserted into one of your veins. You might be given a medicine to control your heart rate during the procedure. This will help to ensure that good images are obtained. You will be asked to lie on an exam table. This table will slide in and out of the CT machine during the procedure. Contrast dye will be injected into the IV. You might feel warm, or you may get a metallic taste in your mouth. You will be given a medicine called nitroglycerin. This will relax or dilate the arteries in your heart. The table that you are lying on will move into the CT machine tunnel for the scan. The person running the machine will give you instructions while the scans are being done. You may be asked to: Keep your arms above your head. Hold your breath. Stay very still, even if the table is moving. When the scanning is complete, you will be moved out of the machine. The IV will be removed. The procedure may vary among health care providers and hospitals. What can I expect after the procedure? After your procedure, it is common to have: A metallic taste in your mouth from the contrast dye. A feeling of warmth. A headache from the nitroglycerin. Follow these instructions at home: Take over-the-counter and prescription medicines only as told by your health care provider. If you are told, drink enough fluid to keep your urine pale yellow. This will help to flush the contrast dye out of your body. Most people can return to their normal activities right after the procedure. Ask your health care provider what activities are safe for you. It is up to you to get the results of your procedure. Ask your health care  provider, or the department that is doing the procedure, when your results will be ready. Keep all follow-up visits as told by your health care provider. This is important. Contact a health care provider if: You have any symptoms of allergy to the contrast dye. These include: Shortness of breath. Rash or hives. A racing heartbeat. Summary A cardiac CT angiogram is a procedure to look at the heart and the area around the heart. It may be done to help find the cause of chest pains or other symptoms of heart disease. During this procedure, a large X-ray machine, called a CT scanner, takes detailed pictures of the heart and the surrounding area after a contrast dye has been injected into blood vessels in the area. Ask your health care provider about changing or stopping your regular medicines before the procedure. This is especially important if you are taking diabetes medicines, blood thinners, or medicines to treat erectile dysfunction. If you are told, drink enough fluid to keep your urine pale yellow. This will help to flush the contrast dye out of your body. This information is not intended to replace advice given to you by your health care provider. Make sure you discuss any questions you have with your health care provider.  Document Revised: 08/27/2021 Document Reviewed: 01/03/2019 Elsevier Patient Education  2023 Elsevier Inc.  Christena Deem- Long Term Monitor Instructions  Your physician has requested you wear a ZIO patch monitor for 14 days.  This is a single patch monitor. [employer] supplies one patch monitor per enrollment. Additional stickers are not available. Please do not apply patch if you will be having a Nuclear Stress Test,  Echocardiogram, Cardiac CT, MRI, or Chest Xray during the period you would be wearing the  monitor. The patch cannot be worn during these tests. You cannot remove and re-apply the  ZIO XT patch monitor.  Your ZIO patch monitor will be mailed 3 day USPS to your  address on file. It may take 3-5 days  to receive your monitor after you have been enrolled.  Once you have received your monitor, please review the enclosed instructions. Your monitor  has already been registered assigning a specific monitor serial # to you.  Billing and Patient Assistance Program Information  We have supplied [employer] with any of your insurance information on file for billing purposes. [employer] offers a sliding scale Patient Assistance Program for patients that do not have  insurance, or whose insurance does not completely cover the cost of the ZIO monitor.  You must apply for the Patient Assistance Program to qualify for this discounted rate.  To apply, please call [employer] at [phone], select option 4, select option 2, ask to apply for  Patient Assistance Program. Meredeth Ide will ask your household income, and how many people  are in your household. They will quote your out-of-pocket cost based on that information.  [employer] will also be able to set up a 96-month, interest-free payment plan if needed.  Applying the monitor   Shave hair from upper left chest.  Hold abrader disc by orange tab. Rub abrader in 40 strokes over the upper left chest as  indicated in your monitor instructions.  Clean area with 4 enclosed alcohol pads. Let dry.  Apply patch as indicated in monitor instructions. Patch will be placed under collarbone on left  side of chest with arrow pointing upward.  Rub patch adhesive wings for 2 minutes. Remove white label marked "1". Remove the white  label marked "2". Rub patch adhesive wings for 2 additional minutes.  While looking in a mirror, press and release button in center of patch. A small green light will  flash 3-4 times. This will be your only indicator that the monitor has been turned on.  Do not shower for the first 24 hours. You may shower after the first 24 hours.  Press the button if you feel a symptom. You will hear a small click. Record  Date, Time and  Symptom in the Patient Logbook.  When you are ready to remove the patch, follow instructions on the last 2 pages of Patient  Logbook. Stick patch monitor onto the last page of Patient Logbook.  Place Patient Logbook in the blue and white box. Use locking tab on box and tape box closed  securely. The blue and white box has prepaid postage on it. Please place it in the mailbox as  soon as possible. Your physician should have your test results approximately 7 days after the  monitor has been mailed back to [facility].  Call [facility] Customer Care at [phone] if you have questions regarding  your ZIO XT patch monitor. Call them immediately if you see an orange light blinking on your  monitor.  If your monitor falls  off in less than 4 days, contact our Monitor department at [phone].  If your monitor becomes loose or falls off after 4 days call [employer] at [phone] for  suggestions on securing your monitor       I,Breanna Adamick,acting as a scribe for Chilton Siiffany Fontana, MD.,have documented all relevant documentation on the behalf of Chilton Siiffany [place], MD,as directed by  Chilton Siiffany [place], MD while in the presence of Chilton Siiffany [place], MD.   I, Florabel Faulks C. Duke Salviaandolph, MD have reviewed all documentation for this visit.  The documentation of the exam, diagnosis, procedures, and orders on 12/25/2021 are all accurate and complete.   Signed, Chilton Siiffany [place], MD  12/25/2021 9:27 AM    [facility]

## 2021-12-25 NOTE — Therapy (Signed)
OUTPATIENT PHYSICAL THERAPY CERVICAL Treatment    Patient Name: Alyssa Bennett MRN: [id number] DOB:2000/08/22, 21 y.o., female Today's Date: 12/25/2021   PT End of Session - 12/25/21 2016     Visit Number 3    Number of Visits 13    Date for PT Re-Evaluation 01/22/22    PT Start Time 0935    PT Stop Time 1014    PT Time Calculation (min) 39 min    Activity Tolerance Patient tolerated treatment well    Behavior During Therapy [facility] for tasks assessed/performed               Past Medical History:  Diagnosis Date   ADHD (attention deficit hyperactivity disorder)    Atypical chest pain 12/25/2021   Migraine    Past Surgical History:  Procedure Laterality Date   TONSILLECTOMY     Patient Active Problem List   Diagnosis Date Noted   Atypical chest pain 12/25/2021   Palpitations 12/04/2021   Stabbing headache 11/09/2021   Gastroesophageal reflux disease 11/09/2021   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   SUBJECTIVE:                                                                                                                                                                                                          SUBJECTIVE STATEMENT: Denies HA today, just soreness.   PERTINENT HISTORY:  Anxiety, ADHD   PAIN:  Are you having pain? Yes: NPRS scale: 7/10 at worst with headache. No  pain today- just soreness Pain location: R neck and headache Pain description: Sharp, squeezing, throbbing Aggravating factors: Spontaneous headache no particular motions assosciated Relieving factors: Migraine relief strategies - dark room, gentle pressure over temporalis   PRECAUTIONS: None   WEIGHT BEARING RESTRICTIONS No   FALLS:  Has patient fallen in last 6 months? No   LIVING ENVIRONMENT: Lives with: lives alone Lives in: House/apartment   OCCUPATION: Retail   PLOF: Independent  PATIENT GOALS   Reduce frequency/severity of headaches, improve neck range of motion   OBJECTIVE:    DIAGNOSTIC FINDINGS:  No recent imaging.  POSTURE: rounded shoulders and forward head   PALPATION: Tender to palpation of generalized R cervical musculature. Concordant pain with trigger points at C3/4 in UT and in temporalis    CERVICAL ROM:    Active ROM AROM (deg) eval AROM 8/4  Flexion WFL   Extension WFL   Right lateral flexion Full, pain on R/stiff  30  Left lateral flexion Full, pain on R 36  Right rotation WFL   Left rotation WFL    (Blank rows = not tested)   8/4: Can feel the stretch both ways but more to the Left  UPPER EXTREMITY ROM:   Full active motion of the shoulders      Active ROM Right eval Left eval  Shoulder flexion [facility]  Shoulder extension      Shoulder abduction [facility]  Shoulder adduction      Shoulder extension      Shoulder internal rotation      Shoulder external rotation [facility] WFL  Middle trapezius      Lower trapezius      Elbow flexion      Elbow extension      Wrist flexion      Wrist extension      Wrist ulnar deviation      Wrist radial deviation      Wrist pronation      Wrist supination      Grip strength       (Blank rows = not tested)   Cervical MMT - All 4+/5. Concordant pain on the right with left side bending    TODAY'S TREATMENT:  8/4 Trigger Point Dry Needling, Manual Therapy Treatment:  Initial or subsequent  education regarding Trigger Point Dry Needling: Subsequent Did patient give consent to treatment with Trigger Point Dry Needling: Yes TPDN with skilled palpation and monitoring followed by STM to the following muscles: bil upper trap, Rt suboccipitals, Lt levator scap  Joint mobilizations: Lt first rib & gross Lt rib cage mobility  Seated postural alignment Seated deep breathing        PATIENT EDUCATION:  Education details: HEP, symptom management, benefits of PT, benefits and risks of TPDN,  Person educated: Patient Education method: Explanation, Demonstration, Tactile cues, Verbal cues, and Handouts Education comprehension: verbalized understanding     HOME EXERCISE PROGRAM: Access Code: [id number]     ASSESSMENT:   CLINICAL IMPRESSION: Side bend flexibility to 36 deg bilat following DN.  Pt verbalized soreness following DN as expected but also reported feeling better. Lt rib cage with limited mobility vs Rt.    OBJECTIVE IMPAIRMENTS decreased ROM, decreased strength, increased muscle spasms, and pain.    ACTIVITY LIMITATIONS  when pain comes on it can effect whatever activity she is doing    PARTICIPATION LIMITATIONS:  any activity she perfoming at the time   PERSONAL FACTORS 1-2 comorbidities: ADHD, history of migraines   are also affecting patient's functional outcome.    REHAB POTENTIAL: Good   CLINICAL DECISION MAKING: Evolving/moderate complexity increasing frequency of headaches that effect her ability to perform ADL'S   EVALUATION COMPLEXITY: Moderate     GOALS: Goals reviewed with patient? Yes   SHORT TERM GOALS: Target date: 8/18    Patient will improve cervical AROM to full and pain-free. Baseline:  Goal status: INITIAL   2.  Patient will improve  generalized cervical strength to 5/5 bil. Baseline:  Goal status: INITIAL   3.  Patient will report having no cervical pain at rest. Baseline:  Goal status: INITIAL   LONG TERM GOALS: Target date: 9/15    Patient will report a decrease in weekly headache frequency to 1 or less. Baseline:  Goal status: INITIAL   2.  Patient will report a decrease in headache severity to 5/10 NPRS to indicate MCID. Baseline:  Goal status: INITIAL   3.  Patient will be independent with gym/exercise program to maintain and improve current level of fitness. Baseline:  Goal status: INITIAL       PLAN: PT FREQUENCY: 1-2x/week   PT DURATION: 6 weeks   PLANNED INTERVENTIONS: Therapeutic exercises, Therapeutic activity, Neuromuscular re-education, Balance training, Gait training, Patient/Family education, Self Care, Joint mobilization, Dry Needling, Cryotherapy, and Moist heat   PLAN FOR NEXT SESSION: Dry needling and soft tissue for TrP. Cervical strength/stability exercises, postural stability.     Kylyn Sookram C. Aquilla Voiles PT, DPT 12/25/21 8:18 PM

## 2021-12-25 NOTE — Assessment & Plan Note (Signed)
She has chest pain that is related to her palpitations and her anxiety.  However she also has exertional component.  She has a family history of premature CAD in her father died at age 22 of a heart attack.  This was complicated by the fact that he also had drug use.  We will get a coronary CTA to better understand her chest pain and to cardiovascular risk.

## 2021-12-25 NOTE — Patient Instructions (Addendum)
Medication Instructions:  TAKE IVABRADINE 10 MG 2 HOURS PRIOR TO CARDIAC CT  *If you need a refill on your cardiac medications before your next appointment, please call your pharmacy*  Lab Work: LABS 1 WEEK PRIOR TO CARDIAC CT  If you have labs (blood work) drawn today and your tests are completely normal, you will receive your results only by: MyChart Message (if you have MyChart) OR A paper copy in the mail If you have any lab test that is abnormal or we need to change your treatment, we will call you to review the results.  Testing/Procedures: Your physician has requested that you have cardiac CT. Cardiac computed tomography (CT) is a painless test that uses an x-ray machine to take clear, detailed pictures of your heart. For further information please visit [email or web address] Please follow instruction sheet as given.  14 DA MONITOR, PLACED TODAY   Follow-Up: At [facility], you and your health needs are our priority.  As part of our continuing mission to provide you with exceptional heart care, we have created designated Provider Care Teams.  These Care Teams include your primary Cardiologist (physician) and Advanced Practice Providers (APPs -  Physician Assistants and Nurse Practitioners) who all work together to provide you with the care you need, when you need it.  We recommend signing up for the patient portal called "MyChart".  Sign up information is provided on this After Visit Summary.  MyChart is used to connect with patients for Virtual Visits (Telemedicine).  Patients are able to view lab/test results, encounter notes, upcoming appointments, etc.  Non-urgent messages can be sent to your provider as well.   To learn more about what you can do with MyChart, go to [email or web address].    Your next appointment:   1-2  month(s)  The format for your next appointment:   In Person  Provider:   Gillian Shields, [facility]  Other Instructions    Your cardiac CT will be  scheduled at one of the below locations:   [facility] [address] [place], [place] [address] [phone]  OR  [facility] [address] [place], [place] [address] [phone]  If scheduled at [facility], please arrive at the [facility] and Children's Entrance (Entrance C2) of [facility] 30 minutes prior to test start time. You can use the FREE valet parking offered at entrance C (encouraged to control the heart rate for the test)  Proceed to the [facility] Radiology Department (first floor) to check-in and test prep.  All radiology patients and guests should use entrance C2 at [facility], accessed from [facility], even though the hospital's physical address listed is [address]    If scheduled at [facility], please arrive 15 mins early for check-in and test prep.  Please follow these instructions carefully (unless otherwise directed):  On the Night Before the Test: Be sure to Drink plenty of water. Do not consume any caffeinated/decaffeinated beverages or chocolate 12 hours prior to your test. Do not take any antihistamines 12 hours prior to your test.  On the Day of the Test: Drink plenty of water until 1 hour prior to the test. Do not eat any food 4 hours prior to the test. You may take your regular medications prior to the test.  Take metoprolol (Lopressor) two hours prior to test. HOLD Furosemide/Hydrochlorothiazide morning of the test. FEMALES- please wear underwire-free bra if available, avoid dresses &  tight clothing      After the Test: Drink plenty of water. After receiving IV contrast, you may experience a mild flushed feeling. This is normal. On occasion, you may experience a mild rash up to 24 hours after the test. This is not dangerous. If this occurs, you can take Benadryl 25 mg and increase your fluid intake. If you  experience trouble breathing, this can be serious. If it is severe call 911 IMMEDIATELY. If it is mild, please call our office. If you take any of these medications: Glipizide/Metformin, Avandament, Glucavance, please do not take 48 hours after completing test unless otherwise instructed.  We will call to schedule your test 2-4 weeks out understanding that some insurance companies will need an authorization prior to the service being performed.   For non-scheduling related questions, please contact the cardiac imaging nurse navigator should you have any questions/concerns: Rockwell Alexandria, Cardiac Imaging Nurse Navigator Larey Brick, Cardiac Imaging Nurse Navigator [facility] Direct Office Dial: [phone]   For scheduling needs, including cancellations and rescheduling, please call [place], [phone].  Cardiac CT Angiogram A cardiac CT angiogram is a procedure to look at the heart and the area around the heart. It may be done to help find the cause of chest pains or other symptoms of heart disease. During this procedure, a substance called contrast dye is injected into the blood vessels in the area to be checked. A large X-ray machine, called a CT scanner, then takes detailed pictures of the heart and the surrounding area. The procedure is also sometimes called a coronary CT angiogram, coronary artery scanning, or CTA. A cardiac CT angiogram allows the health care provider to see how well blood is flowing to and from the heart. The health care provider will be able to see if there are any problems, such as: Blockage or narrowing of the coronary arteries in the heart. Fluid around the heart. Signs of weakness or disease in the muscles, valves, and tissues of the heart. Tell a health care provider about: Any allergies you have. This is especially important if you have had a previous allergic reaction to contrast dye. All medicines you are taking, including  vitamins, herbs, eye drops, creams, and over-the-counter medicines. Any blood disorders you have. Any surgeries you have had. Any medical conditions you have. Whether you are pregnant or may be pregnant. Any anxiety disorders, chronic pain, or other conditions you have that may increase your stress or prevent you from lying still. What are the risks? Generally, this is a safe procedure. However, problems may occur, including: Bleeding. Infection. Allergic reactions to medicines or dyes. Damage to other structures or organs. Kidney damage from the contrast dye that is used. Increased risk of cancer from radiation exposure. This risk is low. Talk with your health care provider about: The risks and benefits of testing. How you can receive the lowest dose of radiation. What happens before the procedure? Wear comfortable clothing and remove any jewelry, glasses, dentures, and hearing aids. Follow instructions from your health care provider about eating and drinking. This may include: For 12 hours before the procedure -- avoid caffeine. This includes tea, coffee, soda, energy drinks, and diet pills. Drink plenty of water or other fluids that do not have caffeine in them. Being well hydrated can prevent complications. For 4-6 hours before the procedure -- stop eating and drinking. The contrast dye can cause nausea, but this is less likely if your stomach is empty. Ask your  health care provider about changing or stopping your regular medicines. This is especially important if you are taking diabetes medicines, blood thinners, or medicines to treat problems with erections (erectile dysfunction). What happens during the procedure?  Hair on your chest may need to be removed so that small sticky patches called electrodes can be placed on your chest. These will transmit information that helps to monitor your heart during the procedure. An IV will be inserted into one of your veins. You might be given a  medicine to control your heart rate during the procedure. This will help to ensure that good images are obtained. You will be asked to lie on an exam table. This table will slide in and out of the CT machine during the procedure. Contrast dye will be injected into the IV. You might feel warm, or you may get a metallic taste in your mouth. You will be given a medicine called nitroglycerin. This will relax or dilate the arteries in your heart. The table that you are lying on will move into the CT machine tunnel for the scan. The person running the machine will give you instructions while the scans are being done. You may be asked to: Keep your arms above your head. Hold your breath. Stay very still, even if the table is moving. When the scanning is complete, you will be moved out of the machine. The IV will be removed. The procedure may vary among health care providers and hospitals. What can I expect after the procedure? After your procedure, it is common to have: A metallic taste in your mouth from the contrast dye. A feeling of warmth. A headache from the nitroglycerin. Follow these instructions at home: Take over-the-counter and prescription medicines only as told by your health care provider. If you are told, drink enough fluid to keep your urine pale yellow. This will help to flush the contrast dye out of your body. Most people can return to their normal activities right after the procedure. Ask your health care provider what activities are safe for you. It is up to you to get the results of your procedure. Ask your health care provider, or the department that is doing the procedure, when your results will be ready. Keep all follow-up visits as told by your health care provider. This is important. Contact a health care provider if: You have any symptoms of allergy to the contrast dye. These include: Shortness of breath. Rash or hives. A racing heartbeat. Summary A cardiac CT  angiogram is a procedure to look at the heart and the area around the heart. It may be done to help find the cause of chest pains or other symptoms of heart disease. During this procedure, a large X-ray machine, called a CT scanner, takes detailed pictures of the heart and the surrounding area after a contrast dye has been injected into blood vessels in the area. Ask your health care provider about changing or stopping your regular medicines before the procedure. This is especially important if you are taking diabetes medicines, blood thinners, or medicines to treat erectile dysfunction. If you are told, drink enough fluid to keep your urine pale yellow. This will help to flush the contrast dye out of your body. This information is not intended to replace advice given to you by your health care provider. Make sure you discuss any questions you have with your health care provider. Document Revised: 08/27/2021 Document Reviewed: 01/03/2019 Elsevier Patient Education  2023 Elsevier Inc.  Luci BankZIO  XT- Long Term Monitor Instructions  Your physician has requested you wear a ZIO patch monitor for 14 days.  This is a single patch monitor. [employer] supplies one patch monitor per enrollment. Additional stickers are not available. Please do not apply patch if you will be having a Nuclear Stress Test,  Echocardiogram, Cardiac CT, MRI, or Chest Xray during the period you would be wearing the  monitor. The patch cannot be worn during these tests. You cannot remove and re-apply the  ZIO XT patch monitor.  Your ZIO patch monitor will be mailed 3 day USPS to your address on file. It may take 3-5 days  to receive your monitor after you have been enrolled.  Once you have received your monitor, please review the enclosed instructions. Your monitor  has already been registered assigning a specific monitor serial # to you.  Billing and Patient Assistance Program Information  We have supplied [employer] with any of your  insurance information on file for billing purposes. [employer] offers a sliding scale Patient Assistance Program for patients that do not have  insurance, or whose insurance does not completely cover the cost of the ZIO monitor.  You must apply for the Patient Assistance Program to qualify for this discounted rate.  To apply, please call [employer] at [phone], select option 4, select option 2, ask to apply for  Patient Assistance Program. Meredeth Ide will ask your household income, and how many people  are in your household. They will quote your out-of-pocket cost based on that information.  [employer] will also be able to set up a 76-month, interest-free payment plan if needed.  Applying the monitor   Shave hair from upper left chest.  Hold abrader disc by orange tab. Rub abrader in 40 strokes over the upper left chest as  indicated in your monitor instructions.  Clean area with 4 enclosed alcohol pads. Let dry.  Apply patch as indicated in monitor instructions. Patch will be placed under collarbone on left  side of chest with arrow pointing upward.  Rub patch adhesive wings for 2 minutes. Remove white label marked "1". Remove the white  label marked "2". Rub patch adhesive wings for 2 additional minutes.  While looking in a mirror, press and release button in center of patch. A small green light will  flash 3-4 times. This will be your only indicator that the monitor has been turned on.  Do not shower for the first 24 hours. You may shower after the first 24 hours.  Press the button if you feel a symptom. You will hear a small click. Record Date, Time and  Symptom in the Patient Logbook.  When you are ready to remove the patch, follow instructions on the last 2 pages of Patient  Logbook. Stick patch monitor onto the last page of Patient Logbook.  Place Patient Logbook in the blue and white box. Use locking tab on box and tape box closed  securely. The blue and white box has prepaid postage on  it. Please place it in the mailbox as  soon as possible. Your physician should have your test results approximately 7 days after the  monitor has been mailed back to [facility].  Call [facility] Customer Care at [phone] if you have questions regarding  your ZIO XT patch monitor. Call them immediately if you see an orange light blinking on your  monitor.  If your monitor falls off in less than 4 days, contact our Monitor department at [phone].  If your  monitor becomes loose or falls off after 4 days call [employer] at [phone] for  suggestions on securing your monitor

## 2021-12-25 NOTE — Assessment & Plan Note (Signed)
She has palpitations since age 21.  More frequent lately.  She has sinus arrhythmia on EKG.  She is unclear how much of this is related to her anxiety but it also occurs with physical activity.  Labs were unremarkable 10/2021.  We discussed the fact that her ADHD meds could be contributing.  However she needs them to function.  Blood pressures are too low for any beta-blockers.  We will get a 14-day ZIO monitor.  She is already working on limiting her caffeine intake.

## 2021-12-31 ENCOUNTER — Ambulatory Visit (INDEPENDENT_AMBULATORY_CARE_PROVIDER_SITE_OTHER): Payer: Medicaid Other | Admitting: Licensed Clinical Social Worker

## 2021-12-31 ENCOUNTER — Ambulatory Visit (HOSPITAL_BASED_OUTPATIENT_CLINIC_OR_DEPARTMENT_OTHER): Payer: Medicaid Other | Admitting: Physical Therapy

## 2021-12-31 ENCOUNTER — Encounter (HOSPITAL_BASED_OUTPATIENT_CLINIC_OR_DEPARTMENT_OTHER): Payer: Self-pay | Admitting: Nurse Practitioner

## 2021-12-31 ENCOUNTER — Encounter (HOSPITAL_BASED_OUTPATIENT_CLINIC_OR_DEPARTMENT_OTHER): Payer: Self-pay | Admitting: Physical Therapy

## 2021-12-31 DIAGNOSIS — F502 Bulimia nervosa, unspecified: Secondary | ICD-10-CM | POA: Insufficient documentation

## 2021-12-31 DIAGNOSIS — M62838 Other muscle spasm: Secondary | ICD-10-CM

## 2021-12-31 DIAGNOSIS — F988 Other specified behavioral and emotional disorders with onset usually occurring in childhood and adolescence: Secondary | ICD-10-CM

## 2021-12-31 DIAGNOSIS — M542 Cervicalgia: Secondary | ICD-10-CM | POA: Diagnosis not present

## 2021-12-31 DIAGNOSIS — L503 Dermatographic urticaria: Secondary | ICD-10-CM

## 2021-12-31 DIAGNOSIS — F4310 Post-traumatic stress disorder, unspecified: Secondary | ICD-10-CM

## 2021-12-31 DIAGNOSIS — F411 Generalized anxiety disorder: Secondary | ICD-10-CM

## 2021-12-31 HISTORY — DX: Bulimia nervosa, unspecified: F50.20

## 2021-12-31 NOTE — Therapy (Signed)
OUTPATIENT PHYSICAL THERAPY CERVICAL Treatment    Patient Name: Alyssa Bennett MRN: [id number] DOB:02/07/2001, 21 y.o., female Today's Date: 01/01/2022   PT End of Session - 12/31/21 1320     Visit Number 4    Number of Visits 13    Date for PT Re-Evaluation 01/22/22    PT Start Time 1300    PT Stop Time 1315   Patient became syncopal and hypertensive during treatment   PT Time Calculation (min) 15 min    Activity Tolerance Patient tolerated treatment well    Behavior During Therapy [facility] for tasks assessed/performed                Past Medical History:  Diagnosis Date   ADHD (attention deficit hyperactivity disorder)    Atypical chest pain 12/25/2021   Migraine    Past Surgical History:  Procedure Laterality Date   TONSILLECTOMY     Patient Active Problem List   Diagnosis Date Noted   Bulimia nervosa 12/31/2021   PTSD (post-traumatic stress disorder) 12/31/2021   Atypical chest pain 12/25/2021   Palpitations 12/04/2021   Stabbing headache 11/09/2021   Gastroesophageal reflux disease 11/09/2021   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   SUBJECTIVE:                                                                                                                                                                                                          SUBJECTIVE STATEMENT: Denies HA  today, just soreness.   PERTINENT HISTORY:  Anxiety, ADHD   PAIN:  Are you having pain? Yes: NPRS scale: 7/10 at worst with headache. No pain today- just soreness Pain location: R neck and headache Pain description: Sharp, squeezing, throbbing Aggravating factors: Spontaneous headache no particular motions assosciated Relieving factors: Migraine relief strategies - dark room, gentle pressure over temporalis   PRECAUTIONS: None   WEIGHT BEARING RESTRICTIONS No   FALLS:  Has patient fallen in last 6  months? No   LIVING ENVIRONMENT: Lives with: lives alone Lives in: House/apartment   OCCUPATION: Retail   PLOF: Independent   PATIENT GOALS   Reduce frequency/severity of headaches, improve neck range of motion   OBJECTIVE:    DIAGNOSTIC FINDINGS:  No recent imaging.  POSTURE: rounded shoulders and forward head   PALPATION: Tender to palpation of generalized R cervical musculature. Concordant pain with trigger points at C3/4 in UT and in temporalis    CERVICAL ROM:    Active ROM AROM (deg) eval AROM 8/4  Flexion WFL   Extension WFL   Right lateral flexion Full, pain on R/stiff  30  Left lateral flexion Full, pain on R 36  Right rotation WFL   Left rotation WFL    (Blank rows = not tested)   8/4: Can feel the stretch both ways but more to the Left  UPPER EXTREMITY ROM:   Full active motion of the shoulders      Active ROM Right eval Left eval  Shoulder flexion [facility]  Shoulder extension      Shoulder abduction [facility]  Shoulder adduction      Shoulder extension      Shoulder internal rotation      Shoulder external rotation [facility] WFL  Middle trapezius      Lower trapezius      Elbow flexion      Elbow extension      Wrist flexion      Wrist extension      Wrist ulnar deviation      Wrist radial deviation      Wrist pronation      Wrist supination      Grip strength       (Blank rows = not tested)   Cervical MMT - All 4+/5. Concordant pain on  the right with left side bending    TODAY'S TREATMENT:  8/11  Trigger Point Dry Needling, Manual Therapy Treatment:  Initial or subsequent education regarding Trigger Point Dry Needling: Subsequent Did patient give consent to treatment with Trigger Point Dry Needling: Yes TPDN with skilled palpation and monitoring followed by STM to the following muscles: bil upper trap, Rt suboccipitals, Lt levator scap  Manual: trigger point release to upper trap  Skilled palpation of trigger points     8/4 Trigger Point Dry Needling, Manual Therapy Treatment:  Initial or subsequent education regarding Trigger Point Dry Needling: Subsequent Did patient give consent to treatment with Trigger Point Dry Needling: Yes TPDN with skilled palpation and monitoring followed by STM to the following muscles: bil upper trap, Rt suboccipitals, Lt levator scap  Joint mobilizations: Lt first rib & gross Lt rib cage mobility  Seated postural alignment Seated deep breathing        PATIENT EDUCATION:  Education details: HEP, symptom management, benefits of PT, benefits and risks of TPDN,  Person educated: Patient Education method: Explanation, Demonstration, Tactile cues, Verbal cues, and Handouts Education comprehension: verbalized understanding     HOME EXERCISE PROGRAM: Access Code: [email or web address]     ASSESSMENT:   CLINICAL IMPRESSION: The patiewas limited today. She became syncopal and nauseated during needing and manual work. She reported high anxiety following a session with a therapist. Her dystolic B/P was found to be high (127 /102). The patient is wearing a heart monitor as well. Her MD was contacted. She was going to have someone drive her home. She has not had a reaction with needling like this before. She feels it  may be a combination of stressors. We will continue next week. She was advised to continue her home stretching and strengthening program.   OBJECTIVE IMPAIRMENTS decreased ROM, decreased  strength, increased muscle spasms, and pain.    ACTIVITY LIMITATIONS  when pain comes on it can effect whatever activity she is doing    PARTICIPATION LIMITATIONS:  any activity she perfoming at the time   PERSONAL FACTORS 1-2 comorbidities: ADHD, history of migraines   are also affecting patient's functional outcome.    REHAB POTENTIAL: Good   CLINICAL DECISION MAKING: Evolving/moderate complexity increasing frequency of headaches that effect her ability to perform ADL'S   EVALUATION COMPLEXITY: Moderate     GOALS: Goals reviewed with patient? Yes   SHORT TERM GOALS: Target date: 8/18    Patient will improve cervical AROM to full and pain-free. Baseline:  Goal status: INITIAL   2.  Patient will improve generalized cervical strength to 5/5 bil. Baseline:  Goal status: INITIAL   3.  Patient will report having no cervical pain at rest. Baseline:  Goal status: INITIAL   LONG TERM GOALS: Target date: 9/15   Patient will report a decrease in weekly headache frequency to 1 or less. Baseline:  Goal status: INITIAL   2.  Patient will report a decrease in headache severity to 5/10 NPRS to indicate MCID. Baseline:  Goal status: INITIAL   3.  Patient will be independent with gym/exercise program to maintain and improve current level of fitness. Baseline:  Goal status: INITIAL       PLAN: PT FREQUENCY: 1-2x/week   PT DURATION: 6 weeks   PLANNED INTERVENTIONS: Therapeutic exercises, Therapeutic activity, Neuromuscular re-education, Balance training, Gait training, Patient/Family education, Self Care, Joint mobilization, Dry Needling, Cryotherapy, and Moist heat   PLAN FOR NEXT SESSION: Dry needling and soft tissue for TrP. Cervical strength/stability exercises, postural stability.     Lorayne Bender PT DPT  01/01/22 6:40 AM
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
## 2022-01-04 ENCOUNTER — Other Ambulatory Visit (HOSPITAL_BASED_OUTPATIENT_CLINIC_OR_DEPARTMENT_OTHER): Payer: Self-pay | Admitting: Nurse Practitioner

## 2022-01-04 DIAGNOSIS — F988 Other specified behavioral and emotional disorders with onset usually occurring in childhood and adolescence: Secondary | ICD-10-CM

## 2022-01-04 MED ORDER — LISDEXAMFETAMINE DIMESYLATE 20 MG PO CAPS: 20.0000 mg | ORAL_CAPSULE | Freq: Every day | ORAL | 0 refills | Status: DC

## 2022-01-04 MED ORDER — LEVOCETIRIZINE DIHYDROCHLORIDE 5 MG PO TABS: 5.0000 mg | ORAL_TABLET | Freq: Every evening | ORAL | 3 refills | Status: DC

## 2022-01-07 ENCOUNTER — Ambulatory Visit (HOSPITAL_BASED_OUTPATIENT_CLINIC_OR_DEPARTMENT_OTHER): Payer: Medicaid Other | Admitting: Physical Therapy

## 2022-01-08 ENCOUNTER — Ambulatory Visit (INDEPENDENT_AMBULATORY_CARE_PROVIDER_SITE_OTHER): Payer: Medicaid Other | Admitting: Nurse Practitioner

## 2022-01-08 ENCOUNTER — Other Ambulatory Visit (HOSPITAL_BASED_OUTPATIENT_CLINIC_OR_DEPARTMENT_OTHER): Payer: Self-pay | Admitting: Nurse Practitioner

## 2022-01-08 DIAGNOSIS — L503 Dermatographic urticaria: Secondary | ICD-10-CM

## 2022-01-08 DIAGNOSIS — B359 Dermatophytosis, unspecified: Secondary | ICD-10-CM

## 2022-01-08 DIAGNOSIS — F988 Other specified behavioral and emotional disorders with onset usually occurring in childhood and adolescence: Secondary | ICD-10-CM

## 2022-01-08 DIAGNOSIS — R7303 Prediabetes: Secondary | ICD-10-CM

## 2022-01-08 MED ORDER — LISDEXAMFETAMINE DIMESYLATE 20 MG PO CAPS
20.0000 mg | ORAL_CAPSULE | Freq: Every day | ORAL | 0 refills | Status: DC
Start: 2022-01-08 — End: 2022-01-08

## 2022-01-08 MED ORDER — LEVOCETIRIZINE DIHYDROCHLORIDE 5 MG PO TABS: 5.0000 mg | ORAL_TABLET | Freq: Every evening | ORAL | 3 refills | Status: DC

## 2022-01-08 MED ORDER — CONTOUR NEXT TEST VI STRP: ORAL_STRIP | 12 refills | Status: DC

## 2022-01-08 MED ORDER — LISDEXAMFETAMINE DIMESYLATE 20 MG PO CAPS
20.0000 mg | ORAL_CAPSULE | Freq: Every day | ORAL | 0 refills | Status: DC
Start: 2022-01-08 — End: 2022-01-27

## 2022-01-08 MED ORDER — CLOTRIMAZOLE 1 % EX CREA: 1.0000 | TOPICAL_CREAM | Freq: Two times a day (BID) | CUTANEOUS | 6 refills | Status: DC

## 2022-01-08 MED ORDER — LISDEXAMFETAMINE DIMESYLATE 20 MG PO CAPS: 20.0000 mg | ORAL_CAPSULE | Freq: Every day | ORAL | 0 refills | Status: DC

## 2022-01-08 NOTE — Progress Notes (Signed)
Virtual Visit Encounter telephone visit.   I connected with  Leanord Bennett on 02/17/22 at  9:50 AM EDT by secure audio telemedicine application. I verified that I am speaking with the correct person using two identifiers.   I introduced myself as a Publishing rights manager with the practice. The limitations of evaluation and management by telemedicine discussed with the patient and the availability of in person appointments. The patient expressed verbal understanding and consent to proceed.  Participating parties in this visit include: Myself and patient  The patient is: Patient Location: Home I am: Provider Location: Office/Clinic Subjective:    CC and HPI: Alyssa Bennett is a 21 y.o. year old female presenting for follow up of ADHD. Patient reports the following: ADHD Elishia tells me that she feels the Vyvanse 20mg  is working very well for her.  She is not having any negative side effects.  She is sleeping well.  She has only taken the methylphenidate twice for afternoon control She is eating normally.  She denies increased anxiety, CP, HA, Palpitations, ShOB,  Dermographia She tells me that she is itching from head to toe when not taking allergy medication daily.  Has seen dermatology and they felt that she needs to be seen by the allergist for testing.  Would like referral for that Taking Xyzal as prescribed.   Red, itchy rash would like cream Would like refill on test strips for glucometer  Past medical history, Surgical history, Family history not pertinant except as noted below, Social history, Allergies, and medications have been entered into the medical record, reviewed, and corrections made.   Review of Systems:  All review of systems negative except what is listed in the HPI  Objective:    Alert and oriented x 4 Speaking in clear sentences with no shortness of breath. No distress.  Impression and Recommendations:    Problem List Items Addressed This Visit      Attention deficit disorder (ADD) without hyperactivity - Primary    Chronic. Doing well on vyvanse daily with prn methylphenidate in the evenings. No SE or alarm sx at this time. PDMP reviewed. Will send in 3 month refills. F/U in 3 months for check. OK for virtual.       Dermographia    Referral to allergy placed. Good control with xyzal daily. Dermatology has evaluated and recommend allergist.       Relevant Medications   levocetirizine (XYZAL) 5 MG tablet   Other Relevant Orders   Ambulatory referral to Allergy   Pre-diabetes    Refill on test strips      Tinea    Erythematous rash consistent with tinea. Will sent treatment.       Relevant Medications   clotrimazole (ANTIFUNGAL, CLOTRIMAZOLE,) 1 % cream    orders and follow up as documented in EMR I discussed the assessment and treatment plan with the patient. The patient was provided an opportunity to ask questions and all were answered. The patient agreed with the plan and demonstrated an understanding of the instructions.   The patient was advised to call back or seek an in-person evaluation if the symptoms worsen or if the condition fails to improve as anticipated.  Follow-Up: in 3 months  I provided 22 minutes of non-face-to-face interaction with this non face-to-face encounter including intake, same-day documentation, and chart review.   , NP , DNP, AGNP-c [facility] Primary [facility] Medicine at [facility] [phone] (fax)

## 2022-01-10 NOTE — Assessment & Plan Note (Signed)
Intermittent palpitations of unknown etiology. At this time etiology unknown. She has had these for quite some time, which could be contributed to underlying anxiety. They do seem to appear with and without medication for ADHD, therefore this is unlikely a source. I do feel she would benefit from cardiology evaluation for monitoring and recommendations given her age and the symptoms present. No alarm sx present at this time. Will send referral today.

## 2022-01-10 NOTE — Assessment & Plan Note (Signed)
Chronic ADHD with previous management with vyvanse and ritalin. She has not been able to get in with psychiatry at this time. Her anxiety is currently well controlled. Based on these factors we will trial restart of Vyvanse and Ritalin for ADHD and monitor closely. If any worsening of anxiety presents, we will consider change in medication therapy. Patient is agreeable to plan. PDMP reviewed. Medications sent to pharmacy. F/U in 3-4 weeks for evaluation and further management.

## 2022-01-10 NOTE — Assessment & Plan Note (Signed)
Chronic. Well controlled at this time. No changes needed. Monitor closely for worsening symptoms following start of ADHD medication.

## 2022-01-12 ENCOUNTER — Ambulatory Visit (HOSPITAL_BASED_OUTPATIENT_CLINIC_OR_DEPARTMENT_OTHER): Payer: Medicaid Other | Admitting: Physical Therapy

## 2022-01-12 ENCOUNTER — Telehealth (HOSPITAL_COMMUNITY): Payer: Self-pay | Admitting: *Deleted

## 2022-01-12 ENCOUNTER — Encounter (HOSPITAL_BASED_OUTPATIENT_CLINIC_OR_DEPARTMENT_OTHER): Payer: Self-pay | Admitting: Physical Therapy

## 2022-01-12 DIAGNOSIS — M62838 Other muscle spasm: Secondary | ICD-10-CM

## 2022-01-12 DIAGNOSIS — M542 Cervicalgia: Secondary | ICD-10-CM

## 2022-01-12 NOTE — Telephone Encounter (Signed)
Attempted to call patient regarding upcoming cardiac CT appointment. °Left message on voicemail with name and callback number ° °Jameis Newsham RN Navigator Cardiac Imaging °[facility] °[phone] Office °[phone] Cell ° °

## 2022-01-12 NOTE — Therapy (Signed)
OUTPATIENT PHYSICAL THERAPY CERVICAL Treatment    Patient Name: Alyssa Bennett MRN: [id number] DOB:2000-11-19, 21 y.o., female Today's Date: 01/12/2022   PT End of Session - 01/12/22 1231     Visit Number 5    Number of Visits 13    Date for PT Re-Evaluation 01/22/22    PT Start Time 0851    PT Stop Time 0931    PT Time Calculation (min) 40 min    Activity Tolerance Patient tolerated treatment well    Behavior During Therapy [facility] for tasks assessed/performed                 Past Medical History:  Diagnosis Date   ADHD (attention deficit hyperactivity disorder)    Atypical chest pain 12/25/2021   Migraine    Past Surgical History:  Procedure Laterality Date   TONSILLECTOMY     Patient Active Problem List   Diagnosis Date Noted   Bulimia nervosa 12/31/2021   PTSD (post-traumatic stress disorder) 12/31/2021   Atypical chest pain 12/25/2021   Palpitations 12/04/2021   Stabbing headache 11/09/2021   Gastroesophageal reflux disease 11/09/2021   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   SUBJECTIVE:                                                                                                                                                                                                          SUBJECTIVE STATEMENT: The patient reports it is a little better. She is having less pain  overall. She is having a minor headache today.   PERTINENT HISTORY:  Anxiety, ADHD   PAIN:  Are you having pain? Yes: NPRS scale: 7/10 at worst with headache. No pain today- just soreness Pain location: R neck and headache Pain description: Sharp, squeezing, throbbing Aggravating factors: Spontaneous headache no particular motions assosciated Relieving factors: Migraine relief strategies - dark room, gentle pressure over temporalis   PRECAUTIONS: None   WEIGHT BEARING RESTRICTIONS No  FALLS:  Has patient fallen in last 6 months? No   LIVING ENVIRONMENT: Lives with: lives alone Lives in: House/apartment   OCCUPATION: Retail   PLOF: Independent   PATIENT GOALS   Reduce frequency/severity of headaches, improve neck range of motion   OBJECTIVE:    DIAGNOSTIC FINDINGS:  No recent imaging.  POSTURE: rounded shoulders and forward head   PALPATION: Tender to palpation of generalized R cervical musculature. Concordant pain with trigger points at C3/4 in UT and in temporalis    CERVICAL ROM:    Active ROM AROM (deg) eval AROM 8/4  Flexion WFL   Extension WFL   Right lateral flexion Full, pain on R/stiff  30  Left lateral flexion Full, pain on R 36  Right rotation WFL   Left rotation WFL    (Blank rows = not tested)   8/4: Can feel the stretch both ways but more to the Left  UPPER EXTREMITY ROM:   Full active motion of the shoulders      Active ROM Right eval Left eval  Shoulder flexion [facility]  Shoulder extension      Shoulder abduction [facility]  Shoulder adduction      Shoulder extension      Shoulder internal rotation      Shoulder external rotation [facility] WFL  Middle trapezius      Lower trapezius      Elbow flexion      Elbow extension      Wrist flexion      Wrist extension      Wrist ulnar deviation      Wrist radial deviation      Wrist pronation      Wrist supination      Grip strength       (Blank rows = not tested)   Cervical MMT - All  4+/5. Concordant pain on the right with left side bending    TODAY'S TREATMENT:  8/22 Trigger Point Dry Needling, Manual Therapy Treatment:  Initial or subsequent education regarding Trigger Point Dry Needling: Subsequent Did patient give consent to treatment with Trigger Point Dry Needling: Yes TPDN with skilled palpation and monitoring followed by STM to the following muscles: bil upper trap, Rt suboccipitals, Lt levator scap  Manual: trigger point release to upper trap  Skilled palpation of trigger points   Bilateral ER 2x10 yellow  Horizontal abduction 2x10 yellow  Horiz abd with flexion x10 yellow   8/11  Trigger Point Dry Needling, Manual Therapy Treatment:  Initial or subsequent education regarding Trigger Point Dry Needling: Subsequent Did patient give consent to treatment with Trigger Point Dry Needling: Yes TPDN with skilled palpation and monitoring followed by STM to the following muscles: bil upper trap, Rt suboccipitals, Lt levator scap  Manual: trigger point release to upper trap  Skilled palpation of trigger points         PATIENT EDUCATION:  Education details: HEP, symptom management, benefits of PT, benefits and risks of TPDN,  Person educated: Patient Education method: Explanation, Demonstration, Tactile cues, Verbal cues, and Handouts Education comprehension: verbalized understanding     HOME EXERCISE PROGRAM: Access Code: [email or web address]     ASSESSMENT:   CLINICAL IMPRESSION: The patient tolerated treatment much better today. She did not become syncopal with needling. She had a great twitch with her upper trap and her sub-occipitals. Overall she feels like the pain is less frequent. She has been a little more stressed because of moving. We reviewed a sitting series  of exercises that may be better for her to do at home. She was given an updated HEP. We reviewed how important it is the patient work on her exercises.     OBJECTIVE IMPAIRMENTS decreased ROM,  decreased strength, increased muscle spasms, and pain.    ACTIVITY LIMITATIONS  when pain comes on it can effect whatever activity she is doing    PARTICIPATION LIMITATIONS:  any activity she perfoming at the time   PERSONAL FACTORS 1-2 comorbidities: ADHD, history of migraines   are also affecting patient's functional outcome.    REHAB POTENTIAL: Good   CLINICAL DECISION MAKING: Evolving/moderate complexity increasing frequency of headaches that effect her ability to perform ADL'S   EVALUATION COMPLEXITY: Moderate     GOALS: Goals reviewed with patient? Yes   SHORT TERM GOALS: Target date: 8/18    Patient will improve cervical AROM to full and pain-free. Baseline:  Goal status: INITIAL   2.  Patient will improve generalized cervical strength to 5/5 bil. Baseline:  Goal status: INITIAL   3.  Patient will report having no cervical pain at rest. Baseline:  Goal status: INITIAL   LONG TERM GOALS: Target date: 9/15   Patient will report a decrease in weekly headache frequency to 1 or less. Baseline:  Goal status: INITIAL   2.  Patient will report a decrease in headache severity to 5/10 NPRS to indicate MCID. Baseline:  Goal status: INITIAL   3.  Patient will be independent with gym/exercise program to maintain and improve current level of fitness. Baseline:  Goal status: INITIAL       PLAN: PT FREQUENCY: 1-2x/week   PT DURATION: 6 weeks   PLANNED INTERVENTIONS: Therapeutic exercises, Therapeutic activity, Neuromuscular re-education, Balance training, Gait training, Patient/Family education, Self Care, Joint mobilization, Dry Needling, Cryotherapy, and Moist heat   PLAN FOR NEXT SESSION: Dry needling and soft tissue for TrP. Cervical strength/stability exercises, postural stability.     Lorayne Bender PT DPT  01/12/22 12:33 PM

## 2022-01-13 ENCOUNTER — Ambulatory Visit (HOSPITAL_COMMUNITY)
Admission: RE | Admit: 2022-01-13 | Discharge: 2022-01-13 | Disposition: A | Discharge status: Home / Self Care | Payer: Medicaid Other | Source: Ambulatory Visit | Attending: Cardiovascular Disease | Admitting: Cardiovascular Disease

## 2022-01-13 DIAGNOSIS — R0789 Other chest pain: Secondary | ICD-10-CM | POA: Diagnosis not present

## 2022-01-13 DIAGNOSIS — R002 Palpitations: Secondary | ICD-10-CM | POA: Diagnosis not present

## 2022-01-13 MED ORDER — DILTIAZEM HCL 25 MG/5ML IV SOLN
INTRAVENOUS | Status: AC
  Filled 2022-01-13: qty 5

## 2022-01-13 MED ORDER — METOPROLOL TARTRATE 5 MG/5ML IV SOLN: 5.0000 mg | INTRAVENOUS | Status: DC | PRN

## 2022-01-13 MED ORDER — METOPROLOL TARTRATE 5 MG/5ML IV SOLN
INTRAVENOUS | Status: AC
  Administered 2022-01-13: 10 mg via INTRAVENOUS
  Filled 2022-01-13: qty 10

## 2022-01-13 MED ORDER — DILTIAZEM HCL 25 MG/5ML IV SOLN: 5.0000 mg | INTRAVENOUS | Status: DC | PRN

## 2022-01-13 MED ORDER — NITROGLYCERIN 0.4 MG SL SUBL: 0.8000 mg | SUBLINGUAL_TABLET | Freq: Once | SUBLINGUAL | Status: AC

## 2022-01-13 MED ORDER — IOHEXOL 350 MG/ML SOLN
95.0000 mL | Freq: Once | INTRAVENOUS | Status: AC | PRN
  Administered 2022-01-13: 95 mL via INTRAVENOUS

## 2022-01-13 MED ORDER — NITROGLYCERIN 0.4 MG SL SUBL
SUBLINGUAL_TABLET | SUBLINGUAL | Status: AC
  Administered 2022-01-13: 0.8 mg via SUBLINGUAL
  Filled 2022-01-13: qty 2

## 2022-01-19 ENCOUNTER — Ambulatory Visit (HOSPITAL_BASED_OUTPATIENT_CLINIC_OR_DEPARTMENT_OTHER): Payer: Medicaid Other | Admitting: Physical Therapy

## 2022-01-19 DIAGNOSIS — M62838 Other muscle spasm: Secondary | ICD-10-CM

## 2022-01-19 DIAGNOSIS — M542 Cervicalgia: Secondary | ICD-10-CM

## 2022-01-19 NOTE — Therapy (Signed)
OUTPATIENT PHYSICAL THERAPY CERVICAL Treatment    Patient Name: Alyssa Bennett MRN: [id number] DOB:[birth year], 21 y.o., female Today's Date: 01/19/2022   PT End of Session - 01/19/22 0856     Visit Number 7    Number of Visits 13    Date for PT Re-Evaluation 01/22/22    PT Start Time 0845    PT Stop Time 0927    PT Time Calculation (min) 42 min    Activity Tolerance Patient tolerated treatment well    Behavior During Therapy [facility] for tasks assessed/performed                  Past Medical History:  Diagnosis Date   ADHD (attention deficit hyperactivity disorder)    Atypical chest pain 12/25/2021   Migraine    Past Surgical History:  Procedure Laterality Date   TONSILLECTOMY     Patient Active Problem List   Diagnosis Date Noted   Bulimia nervosa 12/31/2021   PTSD (post-traumatic stress disorder) 12/31/2021   Atypical chest pain 12/25/2021   Palpitations 12/04/2021   Stabbing headache 11/09/2021   Gastroesophageal reflux disease 11/09/2021   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   SUBJECTIVE:                                                                                                                                                                                                          SUBJECTIVE STATEMENT: The patient woke up today and she is a sore in her neck. She is not  sure why.    PERTINENT HISTORY:  Anxiety, ADHD   PAIN:  Are you having pain? Yes: NPRS scale: 4/10 8/29 Pain location: R neck Pain description: Sharp, squeezing, throbbing Aggravating factors: Spontaneous headache no particular motions assosciated Relieving factors: Migraine relief strategies - dark room, gentle pressure over temporalis   PRECAUTIONS: None   WEIGHT BEARING RESTRICTIONS No   FALLS:  Has patient fallen in last 6 months? No  LIVING ENVIRONMENT: Lives with: lives alone Lives in: House/apartment   OCCUPATION: Retail   PLOF: Independent   PATIENT GOALS   Reduce frequency/severity of headaches, improve neck range of motion   OBJECTIVE:    DIAGNOSTIC FINDINGS:  No recent imaging.  POSTURE: rounded shoulders and forward head   PALPATION: Tender to palpation of generalized R cervical musculature. Concordant pain with trigger points at C3/4 in UT and in temporalis    CERVICAL ROM:    Active ROM AROM (deg) eval AROM 8/4  Flexion WFL   Extension WFL   Right lateral flexion Full, pain on R/stiff  30  Left lateral flexion Full, pain on R 36  Right rotation WFL   Left rotation WFL    (Blank rows = not tested)   8/4: Can feel the stretch both ways but more to the Left  UPPER EXTREMITY ROM:   Full active motion of the shoulders      Active ROM Right eval Left eval  Shoulder flexion [facility]  Shoulder extension      Shoulder abduction [facility]  Shoulder adduction      Shoulder extension      Shoulder internal rotation      Shoulder external rotation [facility] WFL  Middle trapezius      Lower trapezius      Elbow flexion      Elbow extension      Wrist flexion      Wrist extension      Wrist ulnar deviation      Wrist radial deviation      Wrist pronation      Wrist supination      Grip strength       (Blank rows = not tested)   Cervical MMT - All 4+/5. Concordant pain on the right with left side bending    TODAY'S TREATMENT:  8/29 Trigger  Point Dry Needling, Manual Therapy Treatment:  Initial or subsequent education regarding Trigger Point Dry Needling: Subsequent Did patient give consent to treatment with Trigger Point Dry Needling: Yes TPDN with skilled palpation and monitoring followed by STM to the following muscles: bil upper trap, Rt suboccipitals, Lt levator scap  Manual: trigger point release to upper trap  Skilled palpation of trigger points   Bilateral ER 2x10 red  Horizontal abduction 2x10 red   Row 2x10 red  Shoulder extension 2x10   Mini squat with cuing on proper lifting.   8/22 Trigger Point Dry Needling, Manual Therapy Treatment:  Initial or subsequent education regarding Trigger Point Dry Needling: Subsequent Did patient give consent to treatment with Trigger Point Dry Needling: Yes TPDN with skilled palpation and monitoring followed by STM to the following muscles: bil upper trap, Rt suboccipitals, Lt levator scap  Manual: trigger point release to upper trap  Skilled palpation of trigger points   Bilateral ER 2x10 yellow  Horizontal abduction 2x10 yellow  Horiz abd with flexion x10 yellow   8/11  Trigger Point Dry Needling, Manual Therapy Treatment:  Initial or subsequent education regarding Trigger Point Dry Needling: Subsequent Did patient give consent to treatment with Trigger Point Dry Needling: Yes TPDN with skilled palpation and monitoring followed by STM to the following muscles: bil upper trap, Rt suboccipitals, Lt levator scap  Manual: trigger point release to upper trap  Skilled palpation of trigger points         PATIENT EDUCATION:  Education details: HEP, symptom management, benefits of PT, benefits and risks of TPDN,  Person  educated: Patient Education method: Explanation, Demonstration, Tactile cues, Verbal cues, and Handouts Education comprehension: verbalized understanding     HOME EXERCISE PROGRAM: Access Code: [id number]     ASSESSMENT:   CLINICAL  IMPRESSION: The patient came in with some neck pain. She had a great twitch with needling including a needle that replicated the neuralgia pain but it dissipated quickly. We performed manual therapy to her neck. Overall she feels like her neuralgia pain is decreasing in frequency and intensity. She reports she feels like she is lifting with her back. Therapy reviewed proper lifting technique to protect her back and necl. We davanced her bad to red for exercises.    OBJECTIVE IMPAIRMENTS decreased ROM, decreased strength, increased muscle spasms, and pain.    ACTIVITY LIMITATIONS  when pain comes on it can effect whatever activity she is doing    PARTICIPATION LIMITATIONS:  any activity she perfoming at the time   PERSONAL FACTORS 1-2 comorbidities: ADHD, history of migraines   are also affecting patient's functional outcome.    REHAB POTENTIAL: Good   CLINICAL DECISION MAKING: Evolving/moderate complexity increasing frequency of headaches that effect her ability to perform ADL'S   EVALUATION COMPLEXITY: Moderate     GOALS: Goals reviewed with patient? Yes   SHORT TERM GOALS: Target date: 8/18    Patient will improve cervical AROM to full and pain-free. Baseline:  Goal status: INITIAL   2.  Patient will improve generalized cervical strength to 5/5 bil. Baseline:  Goal status: INITIAL   3.  Patient will report having no cervical pain at rest. Baseline:  Goal status: INITIAL   LONG TERM GOALS: Target date: 9/15   Patient will report a decrease in weekly headache frequency to 1 or less. Baseline:  Goal status: INITIAL   2.  Patient will report a decrease in headache severity to 5/10 NPRS to indicate MCID. Baseline:  Goal status: INITIAL   3.  Patient will be independent with gym/exercise program to maintain and improve current level of fitness. Baseline:  Goal status: INITIAL       PLAN: PT FREQUENCY: 1-2x/week   PT DURATION: 6 weeks   PLANNED INTERVENTIONS:  Therapeutic exercises, Therapeutic activity, Neuromuscular re-education, Balance training, Gait training, Patient/Family education, Self Care, Joint mobilization, Dry Needling, Cryotherapy, and Moist heat   PLAN FOR NEXT SESSION: Dry needling and soft tissue for TrP. Cervical strength/stability exercises, postural stability.     Lorayne Bender PT DPT  01/19/22 9:19 AM

## 2022-01-20 ENCOUNTER — Other Ambulatory Visit (HOSPITAL_BASED_OUTPATIENT_CLINIC_OR_DEPARTMENT_OTHER): Payer: Self-pay | Admitting: Nurse Practitioner

## 2022-01-20 ENCOUNTER — Encounter (HOSPITAL_BASED_OUTPATIENT_CLINIC_OR_DEPARTMENT_OTHER): Payer: Self-pay | Admitting: Physical Therapy

## 2022-01-20 DIAGNOSIS — E559 Vitamin D deficiency, unspecified: Secondary | ICD-10-CM

## 2022-01-26 NOTE — Progress Notes (Deleted)
   Office Visit    Patient Name: Alyssa Bennett Date of Encounter: 01/26/2022  PCP:  Tollie Eth, NP   [facility]  Cardiologist:  None *** Advanced Practice Provider:  No care team member to display Electrophysiologist:  None      Chief Complaint    Alyssa Bennett is a 21 y.o. female presents today for follow up after cardiac CTA and ZIO   Past Medical History    Past Medical History:  Diagnosis Date   ADHD (attention deficit hyperactivity disorder)    Atypical chest pain 12/25/2021   Migraine    Past Surgical History:  Procedure Laterality Date   TONSILLECTOMY      Allergies  Allergies  Allergen Reactions   Zithromax [Azithromycin] Itching and Rash    History of Present Illness    Alyssa Bennett is a 21 y.o. female with a hx of ADHD, Tourette's, palpitations last seen 12/25/21.  Seen in consult 12/25/21 with chest flutters described as "heart stops and starts again". Was most often associated with anxiety. She did have some exertional chest discomfort and family history of CAD with father passing from heart attack at 22 years old. Cardiac CTA 01/13/22 coronary calcium score of 0 and no significant extracardiac findings. Monitor preliminary report with predominantly NSR average heart rate 92 bpm, rare PVC/PAC <1% burden, no significant arrhythmia.   She presents today for follow up. ***   EKGs/Labs/Other Studies Reviewed:   The following studies were reviewed today: ***  EKG:  EKG is not ordered today.   Recent Labs: 11/02/2021: ALT 24; BUN 13; Creatinine, Ser 0.51; Hemoglobin 14.7; Platelets 360; Potassium 4.3; Sodium 140; TSH 2.250  Recent Lipid Panel    Component Value Date/Time   CHOL 173 11/02/2021 1132   TRIG 153 (H) 11/02/2021 1132   HDL 46 11/02/2021 1132   CHOLHDL 3.8 11/02/2021 1132   LDLCALC 100 (H) 11/02/2021 1132    Home Medications   No outpatient medications have been marked as taking for the 01/27/22 encounter  (Appointment) with Alver Sorrow, NP.     Review of Systems      All other systems reviewed and are otherwise negative except as noted above.  Physical Exam    VS:  There were no vitals taken for this visit. , BMI There is no height or weight on file to calculate BMI.  Wt Readings from Last 3 Encounters:  12/25/21 181 lb 8 oz (82.3 kg)  12/03/21 179 lb 6.4 oz (81.4 kg)  11/27/21 186 lb 3.2 oz (84.5 kg)     GEN: Well nourished, well developed, in no acute distress. HEENT: normal. Neck: Supple, no JVD, carotid bruits, or masses. Cardiac: ***RRR, no murmurs, rubs, or gallops. No clubbing, cyanosis, edema.  ***Radials/PT 2+ and equal bilaterally.  Respiratory:  ***Respirations regular and unlabored, clear to auscultation bilaterally. GI: Soft, nontender, nondistended. MS: No deformity or atrophy. Skin: Warm and dry, no rash. Neuro:  Strength and sensation are intact. Psych: Normal affect.  Assessment & Plan    Palpitations -   No BP recorded.  {Refresh Note OR Click here to enter BP  :1}***      Disposition: Follow up {follow up:15908} with None or APP.  Signed, Alver Sorrow, NP 01/26/2022, 4:22 PM [facility]

## 2022-01-27 ENCOUNTER — Encounter (HOSPITAL_BASED_OUTPATIENT_CLINIC_OR_DEPARTMENT_OTHER): Payer: Self-pay | Admitting: Family

## 2022-01-27 ENCOUNTER — Encounter (HOSPITAL_BASED_OUTPATIENT_CLINIC_OR_DEPARTMENT_OTHER): Payer: Self-pay | Admitting: Physical Therapy

## 2022-01-27 ENCOUNTER — Ambulatory Visit (HOSPITAL_BASED_OUTPATIENT_CLINIC_OR_DEPARTMENT_OTHER): Payer: Medicaid Other | Admitting: Family

## 2022-01-27 ENCOUNTER — Ambulatory Visit (HOSPITAL_BASED_OUTPATIENT_CLINIC_OR_DEPARTMENT_OTHER): Payer: Medicaid Other | Attending: Nurse Practitioner | Admitting: Physical Therapy

## 2022-01-27 VITALS — BP 114/70 | HR 96 | Ht 60.0 in | Wt 183.0 lb

## 2022-01-27 DIAGNOSIS — M62838 Other muscle spasm: Secondary | ICD-10-CM | POA: Diagnosis present

## 2022-01-27 DIAGNOSIS — R002 Palpitations: Secondary | ICD-10-CM | POA: Diagnosis not present

## 2022-01-27 DIAGNOSIS — Z8249 Family history of ischemic heart disease and other diseases of the circulatory system: Secondary | ICD-10-CM

## 2022-01-27 DIAGNOSIS — M542 Cervicalgia: Secondary | ICD-10-CM | POA: Insufficient documentation

## 2022-01-27 NOTE — Patient Instructions (Signed)
Medication Instructions:  Your Physician recommend you continue on your current medication as directed.    *If you need a refill on your cardiac medications before your next appointment, please call your pharmacy*  Follow-Up: At [facility], you and your health needs are our priority.  As part of our continuing mission to provide you with exceptional heart care, we have created designated Provider Care Teams.  These Care Teams include your primary Cardiologist (physician) and Advanced Practice Providers (APPs -  Physician Assistants and Nurse Practitioners) who all work together to provide you with the care you need, when you need it.  We recommend signing up for the patient portal called "MyChart".  Sign up information is provided on this After Visit Summary.  MyChart is used to connect with patients for Virtual Visits (Telemedicine).  Patients are able to view lab/test results, encounter notes, upcoming appointments, etc.  Non-urgent messages can be sent to your provider as well.   To learn more about what you can do with MyChart, go to [email or web address].    Your next appointment:   Call us if you need [place]!   Other Instructions To prevent palpitations: Make sure you are adequately hydrated.  Avoid and/or limit caffeine containing beverages like soda or tea. Exercise regularly.  Manage stress well. Some over the counter medications can cause palpitations such as Benadryl, AdvilPM, TylenolPM. Regular Advil or Tylenol do not cause palpitations.   Heart Healthy Diet Recommendations: A low-salt diet is recommended. Meats should be grilled, baked, or boiled. Avoid fried foods. Focus on lean protein sources like fish or chicken with vegetables and fruits. The American Heart Association is a Chief Technology Officer!  American Heart Association Diet and Lifeystyle Recommendations   Exercise recommendations: The American Heart Association recommends 150 minutes of moderate intensity exercise  weekly. Try 30 minutes of moderate intensity exercise 4-5 times per week. This could include walking, jogging, or swimming.

## 2022-01-27 NOTE — Progress Notes (Signed)
Office Visit    Patient Name: Alyssa Bennett Date of Encounter: 01/28/2022  PCP:  Alyssa Eth, NP   [facility]  Cardiologist:  Alyssa Si, MD  Advanced Practice Provider:  No care team member to display Electrophysiologist:  None      Chief Complaint    Alyssa Bennett is a 21 y.o. female presents today for follow up after cardiac CTA and ZIO   Past Medical History    Past Medical History:  Diagnosis Date   ADHD (attention deficit hyperactivity disorder)    Atypical chest pain 12/25/2021   Migraine    Past Surgical History:  Procedure Laterality Date   TONSILLECTOMY      Allergies  Allergies  Allergen Reactions   Zithromax [Azithromycin] Itching and Rash    History of Present Illness    Alyssa Bennett is a 21 y.o. female with a hx of ADHD, Tourette's, palpitations last seen 12/25/21.  Seen in consult 12/25/21 with chest flutters described as "heart stops and starts again". Was most often associated with anxiety. She did have some exertional chest discomfort and family history of CAD with father passing from heart attack at 36 years old. Cardiac CTA 01/13/22 coronary calcium score of 0 and no significant extracardiac findings. Monitor preliminary report with predominantly NSR average heart rate 92 bpm, rare PVC/PAC <1% burden, no significant arrhythmia.   She presents today for follow up.  Reviewed cardiac CTA and she is very reassured by the results.  We reviewed her ZIO monitor she is also reassured.  Note still with palpitations which she most often attributes with stress or anxiety.  Some recent stressors due to moving and her fianc is pending surgery.  She is reassured that there were no significant arrhythmias.  We discussed continuing to reduce caffeine intake, increase hydration.  She is exercising in the gym by lifting weights and plans to resume more aerobic exercise such as walking on the treadmill.   EKGs/Labs/Other Studies  Reviewed:   The following studies were reviewed today: Cardiac CTA 01/13/2022 FINDINGS: Coronary calcium score: The patient's coronary artery calcium score is 0, which places the patient in the 0 percentile.   Coronary arteries: Normal coronary origins.  Right dominance.   Right Coronary Artery: Normal caliber vessel, gives rise to PDA. No significant plaque or stenosis.   Left Main Coronary Artery: Normal caliber vessel. No significant plaque or stenosis.   Left Anterior Descending Coronary Artery: Normal caliber vessel. No significant plaque or stenosis. Gives rise to 2 diagonal branches.   Left Circumflex Artery: Normal caliber vessel. No significant plaque or stenosis. Gives rise to 2 OM branches.   Aorta: Normal size, 22 mm at the mid ascending aorta (level of the PA bifurcation) measured double oblique. No aortic atherosclerosis. No dissection seen in visualized portions of the aorta.   Aortic Valve: No calcifications. Trileaflet.   Other findings:   Normal pulmonary vein drainage into the left atrium.   Normal left atrial appendage without a thrombus.   Normal size of the pulmonary artery.   Normal appearance of the pericardium.   IMPRESSION: 1. No evidence of CAD, CADRADS = 0.   2. Coronary calcium score of 0. This was 0 percentile for age and sex matched control.   3. Normal coronary origin with right dominance.  EKG:  EKG is not ordered today.   Recent Labs: 11/02/2021: ALT 24; BUN 13; Creatinine, Ser 0.51; Hemoglobin 14.7; Platelets 360; Potassium 4.3;  Sodium 140; TSH 2.250  Recent Lipid Panel    Component Value Date/Time   CHOL 173 11/02/2021 1132   TRIG 153 (H) 11/02/2021 1132   HDL 46 11/02/2021 1132   CHOLHDL 3.8 11/02/2021 1132   LDLCALC 100 (H) 11/02/2021 1132    Home Medications   Current Meds  Medication Sig   clotrimazole (ANTIFUNGAL, CLOTRIMAZOLE,) 1 % cream Apply 1 Application topically 2 (two) times daily. For athletes foot.    DULoxetine (CYMBALTA) 20 MG capsule TAKE 1 CAPSULE BY MOUTH EVERY DAY   esomeprazole (NEXIUM) 40 MG capsule One tab by mouth at dinner time.   glucose blood (ACCU-CHEK GUIDE) test strip To check blood sugar at least twice a day and additionally as needed for signs of low or high blood sugar.   levocetirizine (XYZAL) 5 MG tablet Take 1 tablet (5 mg total) by mouth every evening.   lisdexamfetamine (VYVANSE) 20 MG capsule Take 1 capsule (20 mg total) by mouth daily.   methylphenidate (RITALIN) 10 MG tablet Take 1 tablet (10 mg total) by mouth daily in the afternoon.   norethindrone-ethinyl estradiol-FE (LOESTRIN FE 1/20) 1-20 MG-MCG tablet Take 1 tablet by mouth daily.   ondansetron (ZOFRAN) 8 MG tablet Take 1 tablet (8 mg total) by mouth every 8 (eight) hours as needed for nausea or vomiting.   rizatriptan (MAXALT) 10 MG tablet Take 1 tablet (10 mg total) by mouth as needed for migraine. May repeat in 2 hours if needed   Vitamin D, Ergocalciferol, (DRISDOL) 1.25 MG (50000 UNIT) CAPS capsule TAKE 1 CAPSULE EVERY 7 (SEVEN) DAYS. TAKE FOR 12 TOTAL DOSES THAN CAN TRANSITION TO 1000IU OTC DAILY   [DISCONTINUED] ivabradine (CORLANOR) 5 MG TABS tablet TAKE 2 TABLETS 2 HOURS PRIOR TO CT     Review of Systems      All other systems reviewed and are otherwise negative except as noted above.  Physical Exam    VS:  BP 114/70   Pulse 96   Ht 5' (1.524 m)   Wt 183 lb (83 kg)   BMI 35.74 kg/m  , BMI Body mass index is 35.74 kg/m.  Wt Readings from Last 3 Encounters:  01/27/22 183 lb (83 kg)  12/25/21 181 lb 8 oz (82.3 kg)  12/03/21 179 lb 6.4 oz (81.4 kg)    GEN: Well nourished, well developed, in no acute distress. HEENT: normal. Neck: Supple, no JVD, carotid bruits, or masses. Cardiac: RRR, no murmurs, rubs, or gallops. No clubbing, cyanosis, edema.  Radials/PT 2+ and equal bilaterally.  Respiratory:  Respirations regular and unlabored, clear to auscultation bilaterally. GI: Soft, nontender,  nondistended. MS: No deformity or atrophy. Skin: Warm and dry, no rash. Neuro:  Strength and sensation are intact. Psych: Normal affect.  Assessment & Plan    Palpitations -ZIO monitor preliminary report with probably normal sinus rhythm, rare PVC/PAC with less than 1% burden.  No significant arrhythmia to cause palpitations.  Her triggered episodes were associated with sinus rhythm or sinus tachycardia.  No indication for AV nodal blocking therapy.  Reassurance provided.  Recommend avoidance of caffeine, staying adequately hydrated, managing stress well.  Anticipate her palpitations are predominantly related to caffeine and anxiety.  Family history of heart disease -cardiac CTA 01/10/2022 with coronary calcium score of 0.  Lipid panel 10/2021 total cholesterol 173, triglycerides 123, HDL 46, LDL 100.  No indication for lipid-lowering therapy this time.  Lipid-lowering diet and regular cardiovascular exercise encouraged.  Discussed goal of 150 minutes of moderate  intensity exercise per week.         Disposition: Follow up prn with Alyssa Si, MD or APP.  Signed, Alver Sorrow, NP 01/28/2022, 1:34 PM  [facility]

## 2022-01-27 NOTE — Therapy (Signed)
OUTPATIENT PHYSICAL THERAPY CERVICAL Treatment    Patient Name: Alyssa Bennett MRN: [id number] DOB:[birth year], 21 y.o., female Today's Date: 01/27/2022   PT End of Session - 01/27/22 1006     Visit Number 8    Number of Visits 14    Date for PT Re-Evaluation 03/10/22    PT Start Time 0930    PT Stop Time 1013    PT Time Calculation (min) 43 min    Activity Tolerance Patient tolerated treatment well    Behavior During Therapy [facility] for tasks assessed/performed                  Past Medical History:  Diagnosis Date   ADHD (attention deficit hyperactivity disorder)    Atypical chest pain 12/25/2021   Migraine    Past Surgical History:  Procedure Laterality Date   TONSILLECTOMY     Patient Active Problem List   Diagnosis Date Noted   Bulimia nervosa 12/31/2021   PTSD (post-traumatic stress disorder) 12/31/2021   Atypical chest pain 12/25/2021   Palpitations 12/04/2021   Stabbing headache 11/09/2021   Gastroesophageal reflux disease 11/09/2021   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   SUBJECTIVE:                                                                                                                                                                                                          SUBJECTIVE STATEMENT: The patient continues to have some soreness when she wakes up but  the stabbing pain him in the back of her head has resolved.   PERTINENT HISTORY:  Anxiety, ADHD   PAIN:  Are you having pain? Yes: NPRS scale: 4/10 8/29 Pain location: R neck Pain description: Sharp, squeezing, throbbing Aggravating factors: Spontaneous headache no particular motions assosciated Relieving factors: Migraine relief strategies - dark room, gentle pressure over temporalis   PRECAUTIONS: None   WEIGHT BEARING RESTRICTIONS No   FALLS:  Has patient fallen  in last 6 months? No   LIVING ENVIRONMENT: Lives with: lives alone Lives in: House/apartment   OCCUPATION: Retail   PLOF: Independent   PATIENT GOALS   Reduce frequency/severity of headaches, improve neck range of motion   OBJECTIVE:    DIAGNOSTIC FINDINGS:  No recent imaging.  POSTURE: rounded shoulders and forward head   PALPATION: Tender to palpation of generalized R cervical musculature. Concordant pain with trigger points at C3/4 in UT and in temporalis    CERVICAL ROM:     Active ROM AROM (deg) eval AROM 8/4  Flexion WFL Full   Extension [facility] Full   Right lateral flexion Full, pain on R/stiff  30  Left lateral flexion Full, pain on R 36  Right rotation WFL   Left rotation WFL    (Blank rows = not tested)   8/4: Can feel the stretch both ways but more to the Left  UPPER EXTREMITY ROM:   Full active motion of the shoulders      Active ROM Right eval Left eval  Shoulder flexion [facility]  Shoulder extension      Shoulder abduction [facility]  Shoulder adduction      Shoulder extension      Shoulder internal rotation      Shoulder external rotation [facility] WFL  Middle trapezius      Lower trapezius      Elbow flexion      Elbow extension      Wrist flexion      Wrist extension      Wrist ulnar deviation      Wrist radial deviation      Wrist pronation      Wrist supination      Grip strength       (Blank rows = not tested)   Cervical MMT - All 4+/5. Concordant pain on the right with  left side bending    TODAY'S TREATMENT:  9/6 Trigger Point Dry Needling, Manual Therapy Treatment:  Initial or subsequent education regarding Trigger Point Dry Needling: Subsequent Did patient give consent to treatment with Trigger Point Dry Needling: Yes TPDN of bilateral UT 2 spots .30x50 needle   TPDN with skilled palpation and monitoring followed by STM to the following muscles: bil upper trap, Rt suboccipitals, Lt levator scap  Reviewed how to use her stretches   Cable row 3x10 10lbs  Cable extension 3x10 10 lbs   Reviewed set up of cables   8/29 Trigger Point Dry Needling, Manual Therapy Treatment:  Initial or subsequent education regarding Trigger Point Dry Needling: Subsequent Did patient give consent to treatment with Trigger Point Dry Needling: Yes TPDN with skilled palpation and monitoring followed by STM to the following muscles: bil upper trap, Rt suboccipitals, Lt levator scap  Manual: trigger point release to upper trap  Skilled palpation of trigger points   Bilateral ER 2x10 red  Horizontal abduction 2x10 red   Row 2x10 red  Shoulder extension 2x10   Mini squat with cuing on proper lifting.   8/22 Trigger Point Dry Needling, Manual Therapy Treatment:  Initial or subsequent education regarding Trigger Point Dry Needling: Subsequent Did patient give consent to treatment with Trigger Point Dry Needling: Yes TPDN with skilled palpation and monitoring followed by STM to the following muscles: bil upper trap, Rt suboccipitals, Lt levator scap  Manual: trigger point release to upper trap  Skilled palpation of trigger points   Bilateral ER 2x10 yellow  Horizontal abduction 2x10 yellow  Horiz  abd with flexion x10 yellow           PATIENT EDUCATION:  Education details: HEP, symptom management, benefits of PT, benefits and risks of TPDN,  Person educated: Patient Education method: Explanation, Demonstration, Tactile cues, Verbal cues, and  Handouts Education comprehension: verbalized understanding     HOME EXERCISE PROGRAM: Access Code: [id number]     ASSESSMENT:   CLINICAL IMPRESSION: The patient is making good progress. She is having much less sharp pain into er head. She is having improved motion overall. She plans on starting at the gym. She will get more consistent with her exercise program. We continue to work on dry needling and manual therapy to her neck. We started her with gym activity today. She tolerated well. We reviewed set up of equipment. We also reviewed which equipment to avoid at this time which isn't too much. We will continue to 1 W6 to continue to progress her to an independent program .    OBJECTIVE IMPAIRMENTS decreased ROM, decreased strength, increased muscle spasms, and pain.    ACTIVITY LIMITATIONS  when pain comes on it can effect whatever activity she is doing    PARTICIPATION LIMITATIONS:  any activity she perfoming at the time   PERSONAL FACTORS 1-2 comorbidities: ADHD, history of migraines   are also affecting patient's functional outcome.    REHAB POTENTIAL: Good   CLINICAL DECISION MAKING: Evolving/moderate complexity increasing frequency of headaches that effect her ability to perform ADL'S   EVALUATION COMPLEXITY: Moderate     GOALS: Goals reviewed with patient? Yes   SHORT TERM GOALS: Target date: 8/18    Patient will improve cervical AROM to full and pain-free. Baseline:  Goal status: mild pain at end range    2.  Patient will improve generalized shoulder  strength to 5/5 bil. Baseline:  Goal status: still weak in ER    3.  Patient will report having no cervical pain at rest. Baseline:  Goal status: archived    LONG TERM GOALS: Target date: 9/15   Patient will report a decrease in weekly headache frequency to 1 or less a week  Baseline:  Goal status: achieved    2.  Patient will report a decrease in headache severity to 5/10 NPRS to indicate MCID. Baseline:  Goal  status: significant decrease in intensity of headache    3.  Patient will be independent with gym/exercise program to maintain and improve current level of fitness. Baseline:  Goal status: INITIAL       PLAN: PT FREQUENCY: 1-2x/week   PT DURATION: 6 weeks   PLANNED INTERVENTIONS: Therapeutic exercises, Therapeutic activity, Neuromuscular re-education, Balance training, Gait training, Patient/Family education, Self Care, Joint mobilization, Dry Needling, Cryotherapy, and Moist heat   PLAN FOR NEXT SESSION: Dry needling and soft tissue for TrP. Cervical strength/stability exercises, postural stability. Continue with gym exercises      Lorayne Bender PT DPT  01/27/22 12:58 PM

## 2022-02-02 ENCOUNTER — Ambulatory Visit (HOSPITAL_BASED_OUTPATIENT_CLINIC_OR_DEPARTMENT_OTHER): Payer: Medicaid Other | Admitting: Nurse Practitioner

## 2022-02-02 ENCOUNTER — Encounter (HOSPITAL_BASED_OUTPATIENT_CLINIC_OR_DEPARTMENT_OTHER): Payer: Self-pay | Admitting: Nurse Practitioner

## 2022-02-02 VITALS — BP 112/68 | HR 86 | Ht 60.0 in | Wt 179.2 lb

## 2022-02-02 DIAGNOSIS — R3982 Chronic bladder pain: Secondary | ICD-10-CM

## 2022-02-02 DIAGNOSIS — N898 Other specified noninflammatory disorders of vagina: Secondary | ICD-10-CM | POA: Diagnosis not present

## 2022-02-02 DIAGNOSIS — N946 Dysmenorrhea, unspecified: Secondary | ICD-10-CM

## 2022-02-02 DIAGNOSIS — M25571 Pain in right ankle and joints of right foot: Secondary | ICD-10-CM | POA: Diagnosis not present

## 2022-02-02 DIAGNOSIS — M25562 Pain in left knee: Secondary | ICD-10-CM | POA: Insufficient documentation

## 2022-02-02 DIAGNOSIS — R42 Dizziness and giddiness: Secondary | ICD-10-CM

## 2022-02-02 DIAGNOSIS — M79641 Pain in right hand: Secondary | ICD-10-CM | POA: Diagnosis not present

## 2022-02-02 DIAGNOSIS — R7303 Prediabetes: Secondary | ICD-10-CM

## 2022-02-02 DIAGNOSIS — M25572 Pain in left ankle and joints of left foot: Secondary | ICD-10-CM | POA: Insufficient documentation

## 2022-02-02 DIAGNOSIS — R202 Paresthesia of skin: Secondary | ICD-10-CM

## 2022-02-02 DIAGNOSIS — K219 Gastro-esophageal reflux disease without esophagitis: Secondary | ICD-10-CM

## 2022-02-02 HISTORY — DX: Pain in left knee: M25.562

## 2022-02-02 HISTORY — DX: Pain in right ankle and joints of right foot: M25.571

## 2022-02-02 MED ORDER — NORETHIN ACE-ETH ESTRAD-FE 1-20 MG-MCG PO TABS: 1.0000 | ORAL_TABLET | Freq: Every day | ORAL | 3 refills | Status: DC

## 2022-02-02 MED ORDER — ACCU-CHEK GUIDE VI STRP: ORAL_STRIP | 99 refills | Status: DC

## 2022-02-02 MED ORDER — ESOMEPRAZOLE MAGNESIUM 40 MG PO CPDR: DELAYED_RELEASE_CAPSULE | ORAL | 3 refills | Status: DC

## 2022-02-02 NOTE — Progress Notes (Signed)
Tollie Eth, DNP, AGNP-c [facility] [address]  [address] [place], [place] [address] [phone]  Subjective:   Alyssa Bennett is a 21 y.o. female presents to day for evaluation of: Follow-up (Patient presents today, she states she never picked up medication from last visit for BV. She has noticed a difference in smell and was sore. Left knee  and ankle pain as well x 1 week)  1. Vaginal odor Noticed a vaginal odor and some vaginal soreness back in July.  She tells me that she has a hard time telling when she needs to use the bathroom and she sometimes leaks urine or has bladder soreness. She reports that she may be holding her urine too long. She tells me this has always been present as far as she can remember. She tells me most of the time her urine is clear or pale yellow.  She tells me that when she has period cramping she feels like she has to pee  She has concerns about POTS. She tells me she is constantly drinking water She has dermatographia, sensitive skin She tells me her sister in law has a severe case of pots She tells me that when she is really cold outside and she goes into a warm house her hands swell twice their size She tells me that ice and snow burns her skin just to touch  2. Acute pain of left knee 3. Acute right ankle pain Has been hurting for several months. Thought it would get better but it hasn't. She has been wearing a brace because it feels "unstable".  Had a fall when knee gave out and fell onto right side and twisted her right ankle.  She also had bruising on the ribs and right side.   She tells me that she is experiencing numbness/tingling in her right hand and into her fingers.  She tells me that sometimes only one finger will feel "off" but other times the entire hand will feel numb.   She tells me she has financial restraints that cause her to have to make more unhealthy choices due to cost.  She  tells me that sometimes trying new foods make her anxious and she   PMH, Medications, and Allergies reviewed and updated in chart as appropriate.   ROS negative except for what is listed in HPI. Objective:  BP 112/68   Pulse 86   Ht 5' (1.524 m)   Wt 179 lb 3.2 oz (81.3 kg)   SpO2 99%   BMI 35.00 kg/m  Physical Exam Vitals and nursing note reviewed.  Constitutional:      General: She is not in acute distress.    Appearance: Normal appearance.  HENT:     Head: Normocephalic.  Eyes:     Extraocular Movements: Extraocular movements intact.     Conjunctiva/sclera: Conjunctivae normal.     Pupils: Pupils are equal, round, and reactive to light.  Neck:     Vascular: No carotid bruit.  Cardiovascular:     Rate and Rhythm: Normal rate and regular rhythm.     Pulses: Normal pulses.     Heart sounds: Normal heart sounds. No murmur heard. Pulmonary:     Effort: Pulmonary effort is normal.     Breath sounds: Normal breath sounds. No wheezing.  Abdominal:     General: Bowel sounds are normal. There is no distension.     Palpations: Abdomen is soft.     Tenderness:  There is no abdominal tenderness. There is no guarding.  Musculoskeletal:        General: Tenderness present. Normal range of motion.     Cervical back: Normal range of motion and neck supple.     Right lower leg: No edema.     Left lower leg: No edema.     Comments: Tenderness and crepitus noted in the left knee.  No laxity.  Pain elicited with pressure on patellar tendon. Tenderness noted to the lateral malleolus of the right ankle.  No instability noted.  Lymphadenopathy:     Cervical: No cervical adenopathy.  Skin:    General: Skin is warm and dry.     Capillary Refill: Capillary refill takes less than 2 seconds.  Neurological:     General: No focal deficit present.     Mental Status: She is alert and oriented to person, place, and time.  Psychiatric:        Mood and Affect: Mood normal.        Behavior:  Behavior normal.        Thought Content: Thought content normal.        Judgment: Judgment normal.           Assessment & Plan:   Problem List Items Addressed This Visit     Gastroesophageal reflux disease    Refill provided on Nexium today.  Referral to gastroenterology placed at patient's request for an evaluation.      Relevant Medications   esomeprazole (NEXIUM) 40 MG capsule   Vaginal odor - Primary    Patient recently diagnosed with BV however did not pick up the medication or started.  Recommend start medication at this time for treatment.      Acute pain of left knee    Left knee pain for several months.  Patient has been wearing a brace for stability.  Crepitus is present on examination today.  No significant swelling or ecchymosis present.  No laxity noted.  Given the length of time she has been experiencing symptoms we will send referral to orthopedics.      Relevant Orders   AMB referral to orthopedics   Acute right ankle pain    Right-sided ankle pain after a fall when her knee gave out causing her to twist her ankle.  No edema or ecchymosis noted on evaluation today.  Tenderness is present to the lateral malleolus.  We will send referral to orthopedics for further evaluation.      Relevant Orders   AMB referral to orthopedics   Hand pain, right    Numbness and tingling of the right hand into the fingers.  Exam reveals positive Tinel's sign.  Coloration, temperature, capillary refill all within normal limits.  Suspect component of carpal tunnel is present.  We will place referral to orthopedics.        Relevant Orders   B12 and Folate Panel (Completed)   AMB referral to orthopedics   Pre-diabetes    Chronic.  Patient has run out of strips for her monitor.  Refill provided today.      Relevant Medications   glucose blood (ACCU-CHEK GUIDE) test strip   Other Relevant Orders   Hemoglobin A1c (Completed)   Dizziness, nonspecific    Patient endorses concerns  with dizziness and suspicion of POTS.  Concerning symptoms include swelling of the hands when going from cold to warm environment, cold items burning to touch, sensitive skin, dermatographia, dizziness, and tachycardia.  We will send  referral for cardiology at patient request. HRRR in the office today with no noted reactivity in rate with movement. BP stable.       Relevant Orders   Ambulatory referral to Cardiology   Other Visit Diagnoses     Tingling in extremities       Relevant Orders   B12 and Folate Panel (Completed)   Chronic bladder pain       Relevant Orders   Ambulatory referral to Urology   Dysmenorrhea       Relevant Medications   norethindrone-ethinyl estradiol-FE (LOESTRIN FE 1/20) 1-20 MG-MCG tablet         Tollie Eth, DNP, AGNP-c 02/20/2022  10:56 PM    History, Medications, Surgery, SDOH, and Family History reviewed and updated as appropriate.

## 2022-02-02 NOTE — Patient Instructions (Addendum)
I have sent the referral to urology to see if they can tell what is going on with the bladder feelings you are having.   I have sent the referral to cardiology for evaluation for POTS  I will you know when we get the labs back and we can treat a vaginal or urinary infection if that is present.   I will check your A1c and B12 today   B12 can make you have tingling and numbness  I have also sent the referral to ortho for the knee and ankle.

## 2022-02-03 LAB — HEMOGLOBIN A1C
Est. average glucose Bld gHb Est-mCnc: 126 mg/dL
Hgb A1c MFr Bld: 6 % — ABNORMAL HIGH (ref 4.8–5.6)

## 2022-02-03 LAB — B12 AND FOLATE PANEL
Folate: 7.1 ng/mL (ref >3.0)
Vitamin B-12: 290 pg/mL (ref 232–1245)

## 2022-02-04 ENCOUNTER — Ambulatory Visit (INDEPENDENT_AMBULATORY_CARE_PROVIDER_SITE_OTHER): Payer: Medicaid Other | Admitting: Orthopaedic Surgery

## 2022-02-04 ENCOUNTER — Ambulatory Visit (INDEPENDENT_AMBULATORY_CARE_PROVIDER_SITE_OTHER): Payer: Medicaid Other

## 2022-02-04 ENCOUNTER — Other Ambulatory Visit (HOSPITAL_BASED_OUTPATIENT_CLINIC_OR_DEPARTMENT_OTHER): Payer: Self-pay

## 2022-02-04 DIAGNOSIS — M79601 Pain in right arm: Secondary | ICD-10-CM | POA: Diagnosis not present

## 2022-02-04 DIAGNOSIS — M25562 Pain in left knee: Secondary | ICD-10-CM | POA: Diagnosis not present

## 2022-02-04 DIAGNOSIS — E8889 Other specified metabolic disorders: Secondary | ICD-10-CM

## 2022-02-04 DIAGNOSIS — M25571 Pain in right ankle and joints of right foot: Secondary | ICD-10-CM

## 2022-02-04 DIAGNOSIS — M2550 Pain in unspecified joint: Secondary | ICD-10-CM | POA: Diagnosis not present

## 2022-02-04 MED ORDER — TRIAMCINOLONE ACETONIDE 40 MG/ML IJ SUSP
80.0000 mg | INTRAMUSCULAR | Status: AC | PRN
  Administered 2022-02-04: 80 mg via INTRA_ARTICULAR

## 2022-02-04 MED ORDER — METHYLPREDNISOLONE 4 MG PO TBPK: ORAL_TABLET | ORAL | 0 refills | Status: DC

## 2022-02-04 MED ORDER — LIDOCAINE HCL 1 % IJ SOLN
4.0000 mL | INTRAMUSCULAR | Status: AC | PRN
  Administered 2022-02-04: 4 mL

## 2022-02-04 NOTE — Progress Notes (Signed)
Chief Complaint: Right arm pain, left knee pain, right ankle pain     History of Present Illness:    Alyssa Bennett is a 21 y.o. female presents today for pain in the left knee that has been going on now for 7 months.  She feels like it is probably getting out.  There is worse pain when she is increasing her walking speed.  With leg curls there is popping of the patella deep at the base of the knee.  With regard to the right ankle she does have a history of ankle popping and clicking.  She has been overcompensating on the side due to the knee.  She is also experiencing pain radiating down the upper back into the right thumb.  She has been undergoing dry needling on this side with some relief although this is overall progressing.    Surgical History:   None  PMH/PSH/Family History/Social History/Meds/Allergies:    Past Medical History:  Diagnosis Date  . ADHD (attention deficit hyperactivity disorder)   . Atypical chest pain 12/25/2021  . Migraine    Past Surgical History:  Procedure Laterality Date  . TONSILLECTOMY     Social History   Socioeconomic History  . Marital status: Single    Spouse name: Not on file  . Number of children: 0  . Years of education: Not on file  . Highest education level: Some college, no degree  Occupational History  . Not on file  Tobacco Use  . Smoking status: Never  . Smokeless tobacco: Never  Substance and Sexual Activity  . Alcohol use: No  . Drug use: No  . Sexual activity: Never  Other Topics Concern  . 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 She enjoys music, science and hanging with friends and family   Caffeine- average 2 a day, used to drink more caffeine   Social Determinants of Health   Financial Resource Strain: Low Risk  (12/25/2021)   Overall Financial Resource Strain (CARDIA)   .  Difficulty of Paying Living Expenses: Not hard at all  Food Insecurity: No Food Insecurity (12/25/2021)   Hunger Vital Sign   . Worried About Programme researcher, broadcasting/film/video in the Last Year: Never true   . Ran Out of Food in the Last Year: Never true  Transportation Needs: No Transportation Needs (12/25/2021)   PRAPARE - Transportation   . Lack of Transportation (Medical): No   . Lack of Transportation (Non-Medical): No  Physical Activity: Sufficiently Active (12/31/2021)   Exercise Vital Sign   . Days of Exercise per Week: 6 days   . Minutes of Exercise per Session: 60 min  Recent Concern: Physical Activity - Inactive (12/25/2021)   Exercise Vital Sign   . Days of Exercise per Week: 0 days   . Minutes of Exercise per Session: 0 min  Stress: Stress Concern Present (12/31/2021)   [employer] of Occupational Health - Occupational Stress Questionnaire   . Feeling of Stress : Very much  Social Connections: Moderately Isolated (12/31/2021)   Social Connection and Isolation Panel [NHANES]   . Frequency of Communication with Friends and Family: More than three times a week   . Frequency  of Social Gatherings with Friends and Family: Once a week   . Attends Religious Services: Never   . Active Member of Clubs or Organizations: No   . Attends Banker Meetings: Never   . Marital Status: Living with partner   Family History  Problem Relation Age of Onset  . Migraines Mother   . ADD / ADHD Mother   . Anxiety disorder Mother   . Heart attack Father 30  . Bradycardia Father   . ADD / ADHD Brother   . Headache Brother   . Migraines Maternal Grandmother   . Anxiety disorder Maternal Grandmother   . Heart attack Paternal Grandmother   . Seizures Neg Hx   . Autism Neg Hx   . Depression Neg Hx   . Bipolar disorder Neg Hx   . Schizophrenia Neg Hx    Allergies  Allergen Reactions  . Zithromax [Azithromycin] Itching and Rash   Current Outpatient Medications  Medication Sig Dispense Refill   . methylPREDNISolone (MEDROL DOSEPAK) 4 MG TBPK tablet Take per packet instructions 1 each 0  . clotrimazole (ANTIFUNGAL, CLOTRIMAZOLE,) 1 % cream Apply 1 Application topically 2 (two) times daily. For athletes foot. 85 g 6  . DULoxetine (CYMBALTA) 20 MG capsule TAKE 1 CAPSULE BY MOUTH EVERY DAY 90 capsule 3  . esomeprazole (NEXIUM) 40 MG capsule One tab by mouth at dinner time. 90 capsule 3  . glucose blood (ACCU-CHEK GUIDE) test strip To check blood sugar at least twice a day and additionally as needed for signs of low or high blood sugar. 100 each 99  . levocetirizine (XYZAL) 5 MG tablet Take 1 tablet (5 mg total) by mouth every evening. 90 tablet 3  . lisdexamfetamine (VYVANSE) 20 MG capsule Take 1 capsule (20 mg total) by mouth daily. 30 capsule 0  . methylphenidate (RITALIN) 10 MG tablet Take 1 tablet (10 mg total) by mouth daily in the afternoon. 30 tablet 0  . norethindrone-ethinyl estradiol-FE (LOESTRIN FE 1/20) 1-20 MG-MCG tablet Take 1 tablet by mouth daily. 84 tablet 3  . ondansetron (ZOFRAN) 8 MG tablet Take 1 tablet (8 mg total) by mouth every 8 (eight) hours as needed for nausea or vomiting. 20 tablet 2  . rizatriptan (MAXALT) 10 MG tablet Take 1 tablet (10 mg total) by mouth as needed for migraine. May repeat in 2 hours if needed 12 tablet 3  . Vitamin D, Ergocalciferol, (DRISDOL) 1.25 MG (50000 UNIT) CAPS capsule TAKE 1 CAPSULE EVERY 7 (SEVEN) DAYS. TAKE FOR 12 TOTAL DOSES THAN CAN TRANSITION TO 1000IU OTC DAILY 12 capsule 0   No current facility-administered medications for this visit.   No results found.  Review of Systems:   A ROS was performed including pertinent positives and negatives as documented in the HPI.  Physical Exam :   Constitutional: NAD and appears stated age Neurological: Alert and oriented Psych: Appropriate affect and cooperative There were no vitals taken for this visit.   Comprehensive Musculoskeletal Exam:    There is tenderness palpation about  the infrapatellar fat pad of the left knee.  Range of motion is from -3-135.  No joint line tenderness.  Negative patella apprehension.  Negative patellar grind.  No joint line tenderness.  Negative Lachman.  There are subluxating peroneal tendons on the right as well as the left.  There is some tenderness around the joint of the right ankle.  Range of motion is to 20 degrees of dorsiflexion to 35 degrees of plantarflexion.  There is some pain with inversion and eversion.  Neurosensory exam within the right upper extremity is intact with endorsed dysesthesias in the right thumb   Imaging:   Xray (4 views left knee, 4 views right hand, 3 views right ankle): Normal    I personally reviewed and interpreted the radiographs.   Assessment:   21 y.o. female with left knee infrapatellar fat pad impingement which I do believe would benefit well from an injection.  Overall I do believe that this will help to offload the right knee and ankle.  I also advised that I do believe that her thumb numbness is consistent with a cervical radiculopathy.  To that effect I would like to start her on a Medrol Dosepak as well.  I will plan to see her back in 1 month  Plan :    -Left knee ultrasound-guided knee injection performed after verbal consent obtained    Procedure Note  Patient: Alyssa Bennett             Date of Birth: Oct 05, 2000           MRN: [id number]             Visit Date: 02/04/2022  Procedures: Visit Diagnoses:  1. Pain in joint involving multiple sites     Large Joint Inj: L knee on 02/04/2022 10:49 AM Indications: pain Details: 22 G 1.5 in needle, ultrasound-guided anterior approach  Arthrogram: No  Medications: 4 mL lidocaine 1 %; 80 mg triamcinolone acetonide 40 MG/ML Outcome: tolerated well, no immediate complications Procedure, treatment alternatives, risks and benefits explained, specific risks discussed. Consent was given by the patient. Immediately prior to procedure a time  out was called to verify the correct patient, procedure, equipment, support staff and site/side marked as required. Patient was prepped and draped in the usual sterile fashion.          I personally saw and evaluated the patient, and participated in the management and treatment plan.  Vanetta Mulders, MD Attending Physician, Orthopedic Surgery  This document was dictated using Dragon voice recognition software. A reasonable attempt at proof reading has been made to minimize errors.

## 2022-02-05 ENCOUNTER — Ambulatory Visit (HOSPITAL_BASED_OUTPATIENT_CLINIC_OR_DEPARTMENT_OTHER): Payer: Medicaid Other | Admitting: Physical Therapy

## 2022-02-07 ENCOUNTER — Encounter (HOSPITAL_BASED_OUTPATIENT_CLINIC_OR_DEPARTMENT_OTHER): Payer: Self-pay | Admitting: Nurse Practitioner

## 2022-02-09 ENCOUNTER — Other Ambulatory Visit (HOSPITAL_BASED_OUTPATIENT_CLINIC_OR_DEPARTMENT_OTHER): Payer: Self-pay | Admitting: Nurse Practitioner

## 2022-02-09 DIAGNOSIS — F988 Other specified behavioral and emotional disorders with onset usually occurring in childhood and adolescence: Secondary | ICD-10-CM

## 2022-02-10 ENCOUNTER — Encounter: Payer: Self-pay | Admitting: Registered"

## 2022-02-10 ENCOUNTER — Encounter: Payer: Medicaid Other | Attending: Nurse Practitioner | Admitting: Registered"

## 2022-02-10 DIAGNOSIS — R7303 Prediabetes: Secondary | ICD-10-CM | POA: Diagnosis present

## 2022-02-10 DIAGNOSIS — E119 Type 2 diabetes mellitus without complications: Secondary | ICD-10-CM | POA: Insufficient documentation

## 2022-02-10 NOTE — Patient Instructions (Addendum)
Work on Chief Technology Officer bottle at least 3x/day Keep foods on hand that you enjoy and also can increase vegetable intake such as [place] Cauliflower bites Continue with some of the healthy changes you have made such as switching to whole grain variety of products and grilled instead of fried chicken sandwiches. Consider working toward having structured meal times of 3x/day. Snacks are not a rule, but are fine if you are hungry. We'll talk more details about meals next visit  24/7 [place] by [place]:  [place]:  [place] (24/7): [phone]   [address] [place], [place] [address] - 24 Hour Crisis Line: [phone]  [facility] [place]: - [facility] (24/7): [phone]   [address] [place], [place] [address] - [place]: [phone]

## 2022-02-10 NOTE — Progress Notes (Signed)
Medical Nutrition Therapy  Appointment Start time:  1400  Appointment End time:  1500  Primary concerns today: pre-diabetes  Referral diagnosis: pre-diabetes Preferred learning style: no preference indicated Learning readiness: ready, change in progress  NUTRITION ASSESSMENT  Anthropometrics not assessed  Clinical Medical Hx: ADD, headaches, irregular periods, ED, GERD, constipation Medications: xyzal and nexium help with sleep Labs: A1c 6.0% Notable Signs/Symptoms: hypoglycemic sxs, pale lips,dehydration  Patient reported feeling dizzy after coming up the elevator. Pt blood sugar was 155 mg/dL. Pt states she took some dayquill earlier which could be contributing to sxs. Pt states she ate about 2 hrs prior to visit. Pt states she sxs lessened. RD accompanied patient to the lobby at end of visit. Getting off the elevator she was dizzy again but stated she felt okay after sitting in the lobby for a few min. Pt states she has experienced similar sxs before and states she was not concerned about driving home.  Lifestyle & Dietary Hx Pt states at one time she reduced her A1c but felt it wasn't in a healthy way. Pt states she would like to understand portion sizes and how to put meals together to help her manage her blood sugar.   Pt states food insecurity barrier to eating healthy. Provided list of resources.  Pt states she is concerned about having a good relationship with food. Pt states her safe foods include subway sandwiches, burritos, pasta, chicken sandwiches. Pt states she has made healthier substitutions within these foods to include more whole grains such as whole grain tortillas, whole grain breads, and grilled instead of fried chicken sandwiches.  Pt states she drinks regular Pepsi, but gets the mini cans. Pt states other sodas will get caffeine-free and sugar-free.  Pt states she is prone to dehydration partly due to medications, but also may not be drinking enough water some  days.   Pt reports that she has had one therapy session with a provider who is not able to see her often and she was given resources for other counselors and patient is waiting to hear back.  Estimated daily fluid intake: 4-5 x/week 3-4, 24 oz water on a good day Supplements: vitamin D Sleep: varies 11:44 - 9 am; wakes up a lot or not sound Stress / self-care: 8.5/10 - hot showers, if time CBD, playing games Current average weekly physical activity: 3-4x/week; 0-1 x/week;  works 5-6 days per week and has an active job, on her feet.  24-Hr Dietary Recall  (lactose intolerant) First Meal: (3 pm) velvita mac and cheese Snack: handful of crunch mix, 3 cheese puffs (co-worker shared snacks) Second Meal: (6) big mac meal  Snack:  Third Meal: (10 pm) viola 3-cheese chicken and vegetables  Snack: none Beverages: water, 8 oz pepsi, caffeine-free & sugar-free, cranberry juice, lactose free milk or chocolate milk  NUTRITION DIAGNOSIS  NB-1.1 Food and nutrition-related knowledge deficit As related to how much and how often to eat.  As evidenced by Pt states does not have hunger/fullness cues and was told to eat 5x a day and wants more specifics.  NUTRITION INTERVENTION  Nutrition education (E-1) on the following topics:  Balanced meals and snacks (MyPlate) Eating on a budget  Handouts Provided Include  Dollar Store meals MyPlate for Diabetes placemat [employer] mobile food pantry schedule [place] Book Snack sheet  Learning Style & Readiness for Change Teaching method utilized: Visual & Auditory  Demonstrated degree of understanding via: Teach Back  Barriers to learning/adherence to  lifestyle change: food insecurity, but may be able to work around.  Goals Established by Pt Work on refilling your water bottle at least 3x/day Keep foods on hand that you enjoy and also can increase vegetable intake such as [place] Cauliflower bites Continue with some of the healthy changes you  have made such as switching to whole grain variety of products and grilled instead of fried chicken sandwiches. Consider working toward having structured meal times of 3x/day. Snacks are not a rule, but are fine if you are hungry. We'll talk more details about meals next visit   MONITORING & EVALUATION Dietary intake, weekly physical activity, and meal time consistency in 6 weeks.

## 2022-02-12 ENCOUNTER — Encounter (HOSPITAL_BASED_OUTPATIENT_CLINIC_OR_DEPARTMENT_OTHER): Payer: Self-pay | Admitting: Physical Therapy

## 2022-02-12 ENCOUNTER — Encounter (HOSPITAL_BASED_OUTPATIENT_CLINIC_OR_DEPARTMENT_OTHER): Payer: Self-pay | Admitting: Nurse Practitioner

## 2022-02-12 ENCOUNTER — Other Ambulatory Visit (INDEPENDENT_AMBULATORY_CARE_PROVIDER_SITE_OTHER): Payer: Medicaid Other

## 2022-02-12 ENCOUNTER — Ambulatory Visit (HOSPITAL_BASED_OUTPATIENT_CLINIC_OR_DEPARTMENT_OTHER): Payer: Medicaid Other | Admitting: Physical Therapy

## 2022-02-12 ENCOUNTER — Ambulatory Visit (HOSPITAL_BASED_OUTPATIENT_CLINIC_OR_DEPARTMENT_OTHER): Payer: Medicaid Other | Admitting: Nurse Practitioner

## 2022-02-12 ENCOUNTER — Encounter (HOSPITAL_BASED_OUTPATIENT_CLINIC_OR_DEPARTMENT_OTHER): Payer: Self-pay

## 2022-02-12 DIAGNOSIS — E538 Deficiency of other specified B group vitamins: Secondary | ICD-10-CM

## 2022-02-12 DIAGNOSIS — M62838 Other muscle spasm: Secondary | ICD-10-CM

## 2022-02-12 DIAGNOSIS — M542 Cervicalgia: Secondary | ICD-10-CM | POA: Diagnosis not present

## 2022-02-12 MED ORDER — CYANOCOBALAMIN 1000 MCG/ML IJ SOLN
1000.0000 ug | INTRAMUSCULAR | Status: DC
  Administered 2022-02-12: 1000 ug via INTRAMUSCULAR

## 2022-02-12 MED ORDER — CYANOCOBALAMIN 1000 MCG/ML IJ SOLN: 1000.0000 ug | Freq: Once | INTRAMUSCULAR | 0 refills | Status: AC

## 2022-02-12 NOTE — Therapy (Signed)
OUTPATIENT PHYSICAL THERAPY CERVICAL Treatment    Patient Name: Alyssa Bennett MRN: [id number] DOB:[birth year], 21 y.o., female Today's Date: 02/12/2022   PT End of Session - 02/12/22 1017     Visit Number 9    Number of Visits 14    Date for PT Re-Evaluation 03/10/22    Authorization Time Period 4 visits approved from  01/19/22-02/17/22    Authorization - Visit Number 3    Authorization - Number of Visits 4    PT Start Time 1015    PT Stop Time 1100    PT Time Calculation (min) 45 min    Activity Tolerance Patient tolerated treatment well    Behavior During Therapy [facility] for tasks assessed/performed                   Past Medical History:  Diagnosis Date   ADHD (attention deficit hyperactivity disorder)    Atypical chest pain 12/25/2021   Migraine    Past Surgical History:  Procedure Laterality Date   TONSILLECTOMY     Patient Active Problem List   Diagnosis Date Noted   Pre-diabetes 02/10/2022   Vaginal odor 02/02/2022   Acute pain of left knee 02/02/2022   Acute left ankle pain 02/02/2022   Hand pain, right 02/02/2022   Bulimia nervosa 12/31/2021   PTSD (post-traumatic stress disorder) 12/31/2021   Atypical chest pain 12/25/2021   Palpitations 12/04/2021   Stabbing headache 11/09/2021   Gastroesophageal reflux disease 11/09/2021   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   Tourette's syndrome 01/26/2019   Generalized anxiety disorder 05/25/2018   Irregular periods 04/01/2016   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   Chronic constipation 09/13/2012   Chronic headaches 09/13/2012   Allergic rhinitis 08/04/2009   PCP: Orma Render, NP   REFERRING PROVIDER: Genia Harold MD    REFERRING DIAG:  M54.2 (ICD-10-CM) - Cervicalgia  M54.81 (ICD-10-CM) - Occipital neuralgia of right side      THERAPY DIAG:  Cervicalgia  Other Muscle spasm   Rationale for Evaluation and Treatment Rehabilitation   ONSET DATE: Chronic - since pt was 21 y.o.    SUBJECTIVE:                                                                                                                                                                                                          SUBJECTIVE STATEMENT: Had shooting feeling down to Rt 1st & 2nd digits. No more shooting pain right now but is very sore. Since the steroids- last one was yesterday- no more shooting.  PERTINENT HISTORY:  Anxiety, ADHD   PAIN:  Are you having pain? Yes: NPRS scale: 4-5/10 Pain location: neck Pain description: aching  Aggravating factors: sleeping on stomach Relieving factors: Migraine relief strategies - dark room, gentle pressure over temporalis   PRECAUTIONS: None   WEIGHT BEARING RESTRICTIONS No   FALLS:  Has patient fallen in last 6 months? No   LIVING ENVIRONMENT: Lives with: lives alone Lives in: House/apartment   OCCUPATION: Retail   PLOF: Independent   PATIENT GOALS   Reduce frequency/severity of headaches, improve neck range of motion   OBJECTIVE:    DIAGNOSTIC FINDINGS:  No recent imaging.  POSTURE: rounded shoulders and forward head   PALPATION: Tender to palpation of generalized R cervical musculature. Concordant pain with trigger points at C3/4 in UT and in temporalis    CERVICAL ROM:     Active ROM AROM (deg) eval AROM 8/4  Flexion WFL Full   Extension [facility] Full   Right lateral flexion Full, pain on R/stiff  30  Left lateral flexion Full, pain on R 36  Right rotation WFL   Left rotation WFL    (Blank rows = not tested)   8/4: Can feel the stretch both ways but more to the Left  UPPER EXTREMITY ROM:   Full active motion of the shoulders      Active ROM Right eval Left eval  Shoulder flexion [facility]  Shoulder extension      Shoulder abduction [facility]  Shoulder adduction      Shoulder extension      Shoulder internal rotation      Shoulder external rotation [facility] WFL  Middle trapezius      Lower trapezius      Elbow  flexion      Elbow extension      Wrist flexion      Wrist extension      Wrist ulnar deviation      Wrist radial deviation      Wrist pronation      Wrist supination      Grip strength       (Blank rows = not tested)   Cervical MMT - All 4+/5. Concordant pain on the right with left side bending    TODAY'S TREATMENT:  9/22 MANUAL: suboccipital release, Lt upper trap STM, cervical traction Supine ab set Ab set + UE flexion, + marching: seated and standing  Gym machines: knee ext, HS curls, leg press Discussed bed postures for sleeping and muscular engagement vs support via ligamentous structures.    9/6 Trigger Point Dry Needling, Manual Therapy Treatment:  Initial or subsequent education regarding Trigger Point Dry Needling: Subsequent Did patient give consent to treatment with Trigger Point Dry Needling: Yes TPDN of bilateral UT 2 spots .30x50 needle   TPDN with skilled palpation and monitoring followed by STM to the following muscles: bil upper trap, Rt suboccipitals, Lt levator scap  Reviewed how to use her stretches   Cable row 3x10 10lbs  Cable extension 3x10 10 lbs   Reviewed set up of cables   8/29 Trigger Point Dry Needling, Manual Therapy Treatment:  Initial or subsequent education regarding Trigger Point Dry Needling: Subsequent Did patient give consent to treatment with Trigger Point Dry Needling: Yes TPDN with skilled palpation and monitoring followed by STM to the following muscles: bil upper trap, Rt suboccipitals, Lt levator scap  Manual: trigger point release to upper trap  Skilled palpation of trigger points   Bilateral ER  2x10 red  Horizontal abduction 2x10 red   Row 2x10 red  Shoulder extension 2x10   Mini squat with cuing on proper lifting.       PATIENT EDUCATION:  Education details: HEP, symptom management, benefits of PT, benefits and risks of TPDN,  Person educated: Patient Education method: Explanation, Demonstration, Tactile cues,  Verbal cues, and Handouts Education comprehension: verbalized understanding     HOME EXERCISE PROGRAM: Access Code: [id number]     ASSESSMENT:   CLINICAL IMPRESSION: Sore musculature but did not see a need for DN today. Overall worked on muscular activation for appropriate postural alignment along biomechanical chain. Ptdoes note that she tends to sleep on her stomach and I encouraged her to find ways to reduce this tendency.    OBJECTIVE IMPAIRMENTS decreased ROM, decreased strength, increased muscle spasms, and pain.    ACTIVITY LIMITATIONS  when pain comes on it can effect whatever activity she is doing    PARTICIPATION LIMITATIONS:  any activity she perfoming at the time   PERSONAL FACTORS 1-2 comorbidities: ADHD, history of migraines   are also affecting patient's functional outcome.    REHAB POTENTIAL: Good   CLINICAL DECISION MAKING: Evolving/moderate complexity increasing frequency of headaches that effect her ability to perform ADL'S   EVALUATION COMPLEXITY: Moderate     GOALS: Goals reviewed with patient? Yes   SHORT TERM GOALS: Target date: 8/18    Patient will improve cervical AROM to full and pain-free. Baseline:  Goal status: mild pain at end range    2.  Patient will improve generalized shoulder  strength to 5/5 bil. Baseline:  Goal status: still weak in ER    3.  Patient will report having no cervical pain at rest. Baseline:  Goal status: archived    LONG TERM GOALS: Target date: 10/18   Patient will report a decrease in weekly headache frequency to 1 or less a week  Baseline:  Goal status: achieved    2.  Patient will report a decrease in headache severity to 5/10 NPRS to indicate MCID. Baseline:  Goal status: significant decrease in intensity of headache    3.  Patient will be independent with gym/exercise program to maintain and improve current level of fitness. Baseline:  Goal status: INITIAL       PLAN: PT FREQUENCY: 1-2x/week   PT  DURATION: 6 weeks   PLANNED INTERVENTIONS: Therapeutic exercises, Therapeutic activity, Neuromuscular re-education, Balance training, Gait training, Patient/Family education, Self Care, Joint mobilization, Dry Needling, Cryotherapy, and Moist heat   PLAN FOR NEXT SESSION: Dry needling and soft tissue for TrP, PRN. Cervical strength/stability exercises, postural stability. Continue with gym exercises  *next visit is last approved*     Annelle Behrendt C. Leonard Feigel PT, DPT 02/12/22 11:54 AM

## 2022-02-12 NOTE — Plan of Care (Cosign Needed)
Patient was here for a B12 injection. Denies GI problems or dizziness

## 2022-02-15 MED ORDER — LISDEXAMFETAMINE DIMESYLATE 20 MG PO CAPS: 20.0000 mg | ORAL_CAPSULE | Freq: Every day | ORAL | 0 refills | Status: DC

## 2022-02-17 ENCOUNTER — Encounter (HOSPITAL_BASED_OUTPATIENT_CLINIC_OR_DEPARTMENT_OTHER): Payer: Medicaid Other | Admitting: Physical Therapy

## 2022-02-17 ENCOUNTER — Encounter (HOSPITAL_BASED_OUTPATIENT_CLINIC_OR_DEPARTMENT_OTHER): Payer: Self-pay | Admitting: Nurse Practitioner

## 2022-02-17 DIAGNOSIS — B359 Dermatophytosis, unspecified: Secondary | ICD-10-CM | POA: Insufficient documentation

## 2022-02-17 NOTE — Assessment & Plan Note (Signed)
Chronic. Doing well on vyvanse daily with prn methylphenidate in the evenings. No SE or alarm sx at this time. PDMP reviewed. Will send in 3 month refills. F/U in 3 months for check. OK for virtual.

## 2022-02-17 NOTE — Assessment & Plan Note (Signed)
Erythematous rash consistent with tinea. Will sent treatment.

## 2022-02-17 NOTE — Assessment & Plan Note (Signed)
Referral to allergy placed. Good control with xyzal daily. Dermatology has evaluated and recommend allergist.

## 2022-02-17 NOTE — Assessment & Plan Note (Signed)
Refill on test strips

## 2022-02-20 DIAGNOSIS — R42 Dizziness and giddiness: Secondary | ICD-10-CM | POA: Insufficient documentation

## 2022-02-20 NOTE — Assessment & Plan Note (Signed)
Numbness and tingling of the right hand into the fingers.  Exam reveals positive Tinel's sign.  Coloration, temperature, capillary refill all within normal limits.  Suspect component of carpal tunnel is present.  We will place referral to orthopedics.

## 2022-02-20 NOTE — Assessment & Plan Note (Addendum)
Refill provided on Nexium today.  Referral to gastroenterology placed at patient's request for an evaluation.

## 2022-02-20 NOTE — Assessment & Plan Note (Signed)
Chronic.  Patient has run out of strips for her monitor.  Refill provided today.

## 2022-02-20 NOTE — Assessment & Plan Note (Signed)
Left knee pain for several months.  Patient has been wearing a brace for stability.  Crepitus is present on examination today.  No significant swelling or ecchymosis present.  No laxity noted.  Given the length of time she has been experiencing symptoms we will send referral to orthopedics.

## 2022-02-20 NOTE — Assessment & Plan Note (Signed)
Patient endorses concerns with dizziness and suspicion of POTS.  Concerning symptoms include swelling of the hands when going from cold to warm environment, cold items burning to touch, sensitive skin, dermatographia, dizziness, and tachycardia.  We will send referral for cardiology at patient request. HRRR in the office today with no noted reactivity in rate with movement. BP stable.

## 2022-02-20 NOTE — Assessment & Plan Note (Signed)
Patient recently diagnosed with BV however did not pick up the medication or started.  Recommend start medication at this time for treatment.

## 2022-02-20 NOTE — Assessment & Plan Note (Signed)
Right-sided ankle pain after a fall when her knee gave out causing her to twist her ankle.  No edema or ecchymosis noted on evaluation today.  Tenderness is present to the lateral malleolus.  We will send referral to orthopedics for further evaluation.

## 2022-02-20 NOTE — Progress Notes (Signed)
Nurse visit  Patient presents for vitamin B12 injection today given diagnosis of pernicious anemia.  CMA reports patient tolerated procedure well with no concerns.  I have reviewed this encounter including the documentation in this note and/or discussed this patient with the [place]. All orders and medical decision making have been approved by the supervising primary care provider.   I am certifying that I agree with the content of this note as supervising primary care provider.   Orma Render, DNP, AGNP-c

## 2022-02-22 ENCOUNTER — Encounter (HOSPITAL_BASED_OUTPATIENT_CLINIC_OR_DEPARTMENT_OTHER): Payer: Self-pay | Admitting: Cardiology

## 2022-02-22 ENCOUNTER — Ambulatory Visit (INDEPENDENT_AMBULATORY_CARE_PROVIDER_SITE_OTHER): Payer: Self-pay | Admitting: Cardiology

## 2022-02-22 VITALS — BP 90/60 | HR 87 | Ht 60.0 in | Wt 176.2 lb

## 2022-02-22 DIAGNOSIS — R002 Palpitations: Secondary | ICD-10-CM

## 2022-02-22 DIAGNOSIS — F988 Other specified behavioral and emotional disorders with onset usually occurring in childhood and adolescence: Secondary | ICD-10-CM

## 2022-02-22 DIAGNOSIS — I951 Orthostatic hypotension: Secondary | ICD-10-CM

## 2022-02-22 DIAGNOSIS — Z8249 Family history of ischemic heart disease and other diseases of the circulatory system: Secondary | ICD-10-CM

## 2022-02-22 NOTE — Patient Instructions (Signed)
Medication Instructions:  Your Physician recommend you continue on your current medication as directed.    *If you need a refill on your cardiac medications before your next appointment, please call your pharmacy*   Lab Work: None ordered today   Testing/Procedures: None ordered today   Follow-Up: At [facility], you and your health needs are our priority.  As part of our continuing mission to provide you with exceptional heart care, we have created designated Provider Care Teams.  These Care Teams include your primary Cardiologist (physician) and Advanced Practice Providers (APPs -  Physician Assistants and Nurse Practitioners) who all work together to provide you with the care you need, when you need it.  We recommend signing up for the patient portal called "MyChart".  Sign up information is provided on this After Visit Summary.  MyChart is used to connect with patients for Virtual Visits (Telemedicine).  Patients are able to view lab/test results, encounter notes, upcoming appointments, etc.  Non-urgent messages can be sent to your provider as well.   To learn more about what you can do with MyChart, go to [email or web address].    Your next appointment:   Call us to schedule appointment after you have been off Vyvanse for 1 month  The format for your next appointment:   In Person  Provider:   Buford Dresser, MD    Spent >20 minutes counseling specifically on the etiology, diagnosis, and symptom management of dysautonomia. The following recommendations were emphasized: -avoid dehydration. Often it requires high volumes of fluids, often with salt/electrolytes included, to stay hydrated. People can be very sensitive to fluid shifts and dehydration. Oral rehydration is preferred, and routine use of IV fluids is not recommended. -if tolerated, compression stocking can assist with fluid management and prevent pooling in the legs. -slow position changes are  recommended -if there is a feeling of severe lightheadedness, like near to passing out, recommend lying on the floor on the back, with legs elevated up on a chair or up against the wall. -the best long term management is gradual exercise conditioning. I recommend seated exercises such as bike to start, to avoid the risk of falling with lightheadedness. Exercise programs, either through supervised programs like cardiac rehab or through personal programs, should focus on gradually increasing exercise tolerance and conditioning.  -this is a link to specific exercise recommendations: http://www.smith-bell.org/ -we discussed the typical spectrum of dysautonomia, including typical populations, that this sometimes spontaneously improves with age (though a small percentage have persistent symptoms), that this has uncomfortable symptoms but is not associated with long term mortality, and that the etiology/treatment of this is an area of active research   Look into some of the electrolyte powders over gatorade/propel liquid form. You want more sodium/potassium/magnesium than sugar.

## 2022-02-22 NOTE — Progress Notes (Signed)
Cardiology Office Note:    Date:  02/22/2022   ID:  Alyssa Bennett, DOB 04/28/2001, MRN [id number]  PCP:  Tollie Eth, NP  Cardiologist:  Chilton Si, MD  Referring MD: Tollie Eth, NP   CC: second opinion, concern for POTS  History of Present Illness:    Alyssa Bennett is a 21 y.o. female with a hx of atypical chest pain and migraines who is seen as a new consult at the request of Early, [name], NP for the evaluation and management of nonspecific dizziness. She has been seen by Dr. Duke Salvia recently and is here for a second opinion as she is concerned she may have POTS.  Today:  The patient frequently gets dizzy spells when changing position. When she gets off of an elevator, she feels very dizzy, lightheaded, and nauseous. She gets extreme numbness in her hands, to the point that she can dig her fingernails into her palms and not feel it. She is in a constant state of nausea. She is very sensitive to temperature change, and her hands get very cold or hot easily. She has never had a syncopal episode.  Today, when she looks at her hands and moves them, she sees "tracks" of where her hands used to be.  She has a constant dry mouth. Drinking water does not see to help. She has been feeling these symptoms since before she restarted Vyvanse and Ritalin. She is now only taking Ritalin on an as-needed basis.She is willing to stop Vyvanse and Ritalin to see if the medications are causing her current symptoms. The last time she stopped Vyvanse, she did so without a taper and without consulting a doctor.   She bruises and bleeds easily, but she attributes this to her B12 deficiency.   She has been on birth control for the last 6 months, and she has not been on birth control before.  The patient is worried about the possibility that she has POTS.  She  denies any palpitations, chest pain, shortness of breath, or peripheral edema. No  headaches, syncope, orthopnea, or PND.  Past  Medical History:  Diagnosis Date   ADHD (attention deficit hyperactivity disorder)    Atypical chest pain 12/25/2021   Migraine     Past Surgical History:  Procedure Laterality Date   TONSILLECTOMY      Current Medications: Current Outpatient Medications on File Prior to Visit  Medication Sig   clotrimazole (ANTIFUNGAL, CLOTRIMAZOLE,) 1 % cream Apply 1 Application topically 2 (two) times daily. For athletes foot.   DULoxetine (CYMBALTA) 20 MG capsule TAKE 1 CAPSULE BY MOUTH EVERY DAY   esomeprazole (NEXIUM) 40 MG capsule One tab by mouth at dinner time.   glucose blood (ACCU-CHEK GUIDE) test strip To check blood sugar at least twice a day and additionally as needed for signs of low or high blood sugar.   levocetirizine (XYZAL) 5 MG tablet Take 1 tablet (5 mg total) by mouth every evening.   lisdexamfetamine (VYVANSE) 20 MG capsule Take 1 capsule (20 mg total) by mouth daily.   methylphenidate (RITALIN) 10 MG tablet Take 1 tablet (10 mg total) by mouth daily in the afternoon.   methylPREDNISolone (MEDROL DOSEPAK) 4 MG TBPK tablet Take per packet instructions   norethindrone-ethinyl estradiol-FE (LOESTRIN FE 1/20) 1-20 MG-MCG tablet Take 1 tablet by mouth daily.   ondansetron (ZOFRAN) 8 MG tablet Take 1 tablet (8 mg total) by mouth every 8 (eight) hours as needed for nausea or vomiting.  rizatriptan (MAXALT) 10 MG tablet Take 1 tablet (10 mg total) by mouth as needed for migraine. May repeat in 2 hours if needed   Vitamin D, Ergocalciferol, (DRISDOL) 1.25 MG (50000 UNIT) CAPS capsule TAKE 1 CAPSULE EVERY 7 (SEVEN) DAYS. TAKE FOR 12 TOTAL DOSES THAN CAN TRANSITION TO 1000IU OTC DAILY   Current Facility-Administered Medications on File Prior to Visit  Medication   cyanocobalamin (VITAMIN B12) injection 1,000 mcg     Allergies:   Zithromax [azithromycin]   Social History   Tobacco Use   Smoking status: Never   Smokeless tobacco: Never  Substance Use Topics   Alcohol use: No   Drug  use: No    Family History: family history includes ADD / ADHD in her brother and mother; Anxiety disorder in her maternal grandmother and mother; Bradycardia in her father; Headache in her brother; Heart attack in her paternal grandmother; Heart attack (age of onset: 41) in her father; Migraines in her maternal grandmother and mother. There is no history of Seizures, Autism, Depression, Bipolar disorder, or Schizophrenia.  ROS:   Please see the history of present illness.  Additional pertinent ROS: Constitutional: Negative for chills, fever, night sweats, unintentional weight loss. Positive for dry mouth.  HENT: Negative for ear pain and hearing loss.   Eyes: Negative for loss of vision and eye pain. Positive for vision changes. Respiratory: Negative for cough, sputum, wheezing.   Cardiovascular: See HPI. Gastrointestinal: Negative for abdominal pain, melena, and hematochezia. Positive for nausea.  Genitourinary: Negative for dysuria and hematuria.  Musculoskeletal: Negative for falls and myalgias.  Skin: Negative for itching and rash.  Neurological: Negative for focal weakness, focal sensory changes and loss of consciousness. Positive for dizziness and lightheadedness. Positive for numbness in extremities.  Endo/Heme/Allergies: Positive for bruising/bleeding easily.     EKGs/Labs/Other Studies Reviewed:    The following studies were reviewed today:  Long Term Monitor 01/26/22:  14 Day Zio Monitor   Quality: Fair.  Baseline artifact. Predominant rhythm: sinus rhythm Average heart rate: 92 bpm Max heart rate: 191 bpm Min heart rate: 53 bpm Pauses >2.5 seconds: none   Rare PACs and PVCs   Multiple patient-triggered episodes at which time sinus rhythm or sinus tachycardia were noted.  CT Coronary Morph 01/13/22:    FINDINGS: No suspicious nodules, masses, or infiltrates are identified in the visualized portion of the lungs. No pleural fluid seen.   The visualized portions of  the mediastinum and chest wall are unremarkable.   IMPRESSION: No significant non-cardiac abnormality identified.  EKG:  EKG is personally reviewed.   02/22/22: NSR at 87 bpm sitting, sinus tach at 105 bpm standing  Recent Labs: 11/02/2021: ALT 24; BUN 13; Creatinine, Ser 0.51; Hemoglobin 14.7; Platelets 360; Potassium 4.3; Sodium 140; TSH 2.250   Recent Lipid Panel    Component Value Date/Time   CHOL 173 11/02/2021 1132   TRIG 153 (H) 11/02/2021 1132   HDL 46 11/02/2021 1132   CHOLHDL 3.8 11/02/2021 1132   LDLCALC 100 (H) 11/02/2021 1132    Physical Exam:    VS:  BP 90/60 (BP Location: Left Arm, Patient Position: Sitting, Cuff Size: Normal)   Pulse 87   Ht 5' (1.524 m)   Wt 176 lb 3.2 oz (79.9 kg)   LMP 01/31/2022 (Approximate)   BMI 34.41 kg/m  Orthostatics:  Lying 130/87, HR 93 Sitting 149/92, HR 94 Standing 92/70, HR 116 Standing 3 min 132/85, HR 112    Wt Readings from Last 3  Encounters:  02/02/22 179 lb 3.2 oz (81.3 kg)  01/27/22 183 lb (83 kg)  12/25/21 181 lb 8 oz (82.3 kg)    GEN: Well nourished, well developed in no acute distress HEENT: Normal, moist mucous membranes NECK: No JVD CARDIAC: regular rhythm, normal S1 and S2, no rubs or gallops. No murmur. VASCULAR: Radial and DP pulses 2+ bilaterally. No carotid bruits RESPIRATORY:  Clear to auscultation without rales, wheezing or rhonchi  ABDOMEN: Soft, non-tender, non-distended MUSCULOSKELETAL:  Ambulates independently SKIN: Warm and dry, no edema NEUROLOGIC:  Alert and oriented x 3. No focal neuro deficits noted. PSYCHIATRIC:  Normal affect    ASSESSMENT:    1. Family history of cardiovascular disease   2. Palpitations   3. 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 This suggests that her HR increase is compensatory -we discussed possible etiologies  of this at length. Discussed options for further evaluation. After shared decision making, she will talk to her provider about tapering off the vyvanse to see if this changes her symptoms. Once she has been off the vyvanse for a month, we will recheck orthostatics -I did discuss with her that the other options include remaining on the vyvanse and attempting conservative management. She feels strongly that she would like to determine the underlying reason and can manage without the vyvanse for a time.  Extensive counseling: -avoid dehydration. Often it requires high volumes of fluids, often with salt/electrolytes included, to stay hydrated. People with orthostasis are very sensitive to fluid shifts and dehydration. Oral rehydration is preferred, and routine use of IV fluids is not recommended. -if tolerated, compression stocking can assist with fluid management and prevent pooling in the legs. -slow position changes are recommended -if there is a feeling of severe lightheadedness, like near to passing out, recommend lying on the floor on the back, with legs elevated up on a chair or up against the wall. -the best long term management is gradual exercise conditioning. I recommend seated exercises such as bike to start, to avoid the risk of falling with lightheadedness. Exercise programs, either through supervised programs like cardiac rehab or through personal programs, should focus on gradually increasing exercise tolerance and conditioning.    Cardiac risk counseling and prevention recommendations: -recommend heart healthy/Mediterranean diet, with whole grains, fruits, vegetable, fish, lean meats, nuts, and olive oil. Limit salt. -recommend moderate walking, 3-5 times/week for 30-50 minutes each session. Aim for at least 150 minutes.week. Goal should be pace of 3 miles/hours, or walking 1.5 miles in 30 minutes -recommend avoidance of tobacco products. Avoid excess alcohol. -ASCVD risk score: The ASCVD  Risk score (Arnett DK, et al., 2019) failed to calculate for the following reasons:   The 2019 ASCVD risk score is only valid for ages 32 to 37    Plan for follow up: after 1 month of being off of vyvanse or sooner as needed.  Jodelle Red, MD, PhD, [facility]  [facility]    Medication Adjustments/Labs and Tests Ordered: Current medicines are reviewed at length with the patient today.  Concerns regarding medicines are outlined above.   Orders Placed This Encounter  Procedures   EKG 12-Lead   No orders of the defined types were placed in this encounter.  Patient Instructions  Medication Instructions:  Your Physician recommend you continue on your current medication as directed.    *  If you need a refill on your cardiac medications before your next appointment, please call your pharmacy*   Lab Work: None ordered today   Testing/Procedures: None ordered today   Follow-Up: At [facility], you and your health needs are our priority.  As part of our continuing mission to provide you with exceptional heart care, we have created designated Provider Care Teams.  These Care Teams include your primary Cardiologist (physician) and Advanced Practice Providers (APPs -  Physician Assistants and Nurse Practitioners) who all work together to provide you with the care you need, when you need it.  We recommend signing up for the patient portal called "MyChart".  Sign up information is provided on this After Visit Summary.  MyChart is used to connect with patients for Virtual Visits (Telemedicine).  Patients are able to view lab/test results, encounter notes, upcoming appointments, etc.  Non-urgent messages can be sent to your provider as well.   To learn more about what you can do with MyChart, go to [email or web address].    Your next appointment:   Call us to schedule appointment after you have been off Vyvanse for 1 month  The format for your next appointment:    In Person  Provider:   Jodelle Red, MD    Spent >20 minutes counseling specifically on the etiology, diagnosis, and symptom management of dysautonomia. The following recommendations were emphasized: -avoid dehydration. Often it requires high volumes of fluids, often with salt/electrolytes included, to stay hydrated. People can be very sensitive to fluid shifts and dehydration. Oral rehydration is preferred, and routine use of IV fluids is not recommended. -if tolerated, compression stocking can assist with fluid management and prevent pooling in the legs. -slow position changes are recommended -if there is a feeling of severe lightheadedness, like near to passing out, recommend lying on the floor on the back, with legs elevated up on a chair or up against the wall. -the best long term management is gradual exercise conditioning. I recommend seated exercises such as bike to start, to avoid the risk of falling with lightheadedness. Exercise programs, either through supervised programs like cardiac rehab or through personal programs, should focus on gradually increasing exercise tolerance and conditioning.  -this is a link to specific exercise recommendations: http://peterson-powell.net/ -we discussed the typical spectrum of dysautonomia, including typical populations, that this sometimes spontaneously improves with age (though a small percentage have persistent symptoms), that this has uncomfortable symptoms but is not associated with long term mortality, and that the etiology/treatment of this is an area of active research   Look into some of the electrolyte powders over gatorade/propel liquid form. You want more sodium/potassium/magnesium than sugar.   I,Mary Shauna Hugh Buren,acting as a scribe for [employer], MD.,have documented all relevant documentation on the behalf of Jodelle Red,  MD,as directed by  Jodelle Red, MD while in the presence of Jodelle Red, MD.  I, Jodelle Red, MD, have reviewed all documentation for this visit. The documentation on 03/25/22 for the exam, diagnosis, procedures, and orders are all accurate and complete.   Signed, Jodelle Red, MD PhD 02/22/2022 1:07 PM    [facility]

## 2022-02-26 ENCOUNTER — Ambulatory Visit (HOSPITAL_BASED_OUTPATIENT_CLINIC_OR_DEPARTMENT_OTHER): Payer: Medicaid Other | Admitting: Physical Therapy

## 2022-03-04 ENCOUNTER — Ambulatory Visit (HOSPITAL_BASED_OUTPATIENT_CLINIC_OR_DEPARTMENT_OTHER): Payer: Medicaid Other | Attending: Nurse Practitioner | Admitting: Physical Therapy

## 2022-03-04 DIAGNOSIS — M542 Cervicalgia: Secondary | ICD-10-CM | POA: Insufficient documentation

## 2022-03-04 DIAGNOSIS — M62838 Other muscle spasm: Secondary | ICD-10-CM | POA: Diagnosis present

## 2022-03-05 ENCOUNTER — Encounter (HOSPITAL_BASED_OUTPATIENT_CLINIC_OR_DEPARTMENT_OTHER): Payer: Self-pay | Admitting: Physical Therapy

## 2022-03-05 NOTE — Therapy (Signed)
OUTPATIENT PHYSICAL THERAPY CERVICAL Treatment    Patient Name: Alyssa Bennett MRN: [id number] DOB:January 03, 2001, 21 y.o., female Today's Date: 03/04/2022   PT End of Session - 03/05/22 1108     Visit Number 10    Number of Visits 16    Date for PT Re-Evaluation 04/16/22    Authorization Time Period 4 visits approved from  01/19/22-02/17/22    PT Start Time 1015    PT Stop Time 1058    PT Time Calculation (min) 43 min    Activity Tolerance Patient tolerated treatment well    Behavior During Therapy [facility] for tasks assessed/performed                    Past Medical History:  Diagnosis Date   ADHD (attention deficit hyperactivity disorder)    Atypical chest pain 12/25/2021   Migraine    Past Surgical History:  Procedure Laterality Date   TONSILLECTOMY     Patient Active Problem List   Diagnosis Date Noted   Dizziness, nonspecific 02/20/2022   Tinea 02/17/2022   Pre-diabetes 02/10/2022   Vaginal odor 02/02/2022   Acute pain of left knee 02/02/2022   Acute right ankle pain 02/02/2022   Hand pain, right 02/02/2022   Bulimia nervosa 12/31/2021   PTSD (post-traumatic stress disorder) 12/31/2021   Atypical chest pain 12/25/2021   Palpitations 12/04/2021   Stabbing headache 11/09/2021   Gastroesophageal reflux disease 11/09/2021   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    SUBJECTIVE:                                                                                                                                                                                                          SUBJECTIVE STATEMENT: The patient reports over the past few days she has had a little shooting. She has also been having knee pain.   PERTINENT HISTORY:  Anxiety, ADHD   PAIN:  Are you having pain? Yes:  NPRS scale: 4-5/10 Pain location: neck Pain description: aching  Aggravating factors: sleeping on stomach Relieving factors: Migraine relief strategies - dark room, gentle pressure over temporalis   PRECAUTIONS: None   WEIGHT BEARING RESTRICTIONS No   FALLS:  Has patient fallen in last 6 months? No   LIVING ENVIRONMENT: Lives with: lives alone Lives in: House/apartment   OCCUPATION: Retail   PLOF: Independent   PATIENT GOALS   Reduce frequency/severity of headaches, improve neck range of motion   OBJECTIVE:    DIAGNOSTIC FINDINGS:  No recent imaging.  POSTURE: rounded shoulders and forward head   PALPATION: Tender to palpation of generalized R cervical musculature. Concordant pain with trigger points at C3/4 in UT and in temporalis    CERVICAL ROM:     Active ROM AROM (deg) eval AROM 8/4  Flexion WFL Full   Extension [facility] Full   Right lateral flexion Full, pain on R/stiff  30  Left lateral flexion Full, pain on R 36  Right rotation WFL   Left rotation WFL    (Blank rows = not tested)   8/4: Can feel the stretch both ways but more to the Left  UPPER EXTREMITY ROM:   Full active motion of the shoulders      Active ROM Right eval Left eval  Shoulder flexion [facility]  Shoulder extension      Shoulder abduction [facility]  Shoulder adduction      Shoulder extension      Shoulder internal rotation      Shoulder external rotation [facility] WFL  Middle trapezius      Lower trapezius      Elbow flexion      Elbow extension      Wrist flexion       Wrist extension      Wrist ulnar deviation      Wrist radial deviation      Wrist pronation      Wrist supination      Grip strength       (Blank rows = not tested)  shoulder flexion 5/5  Shoulder IR 5//5  Shoulder er 4+/5 on the right     TODAY'S TREATMENT:  10/13 rigger Point Dry Needling, Manual Therapy Treatment:  Initial or subsequent education regarding Trigger Point Dry Needling: Subsequent Did patient give consent to treatment with Trigger Point Dry Needling: Yes TPDN of bilateral UT 2 spots .30x50 needle sub-occipitals 2 spots  Response: great twitch response   Reviewed core and postural exercises that can also help the knee Bridging 2x15  SLR 3x10 Supine hip abduction 3x10  Seated heel raise x20     9/22 MANUAL: suboccipital release, Lt upper trap STM, cervical traction Supine ab set Ab set + UE flexion, + marching: seated and standing  Gym machines: knee ext, HS curls, leg press Discussed bed postures for sleeping and muscular engagement vs support via ligamentous structures.    9/6 Trigger Point Dry Needling, Manual Therapy Treatment:  Initial or subsequent education regarding Trigger Point Dry Needling: Subsequent Did patient give consent to treatment with Trigger Point Dry Needling: Yes TPDN of bilateral UT 2 spots .30x50 needle   TPDN with skilled palpation and monitoring followed by STM to the following muscles: bil upper trap, Rt suboccipitals, Lt levator scap  Reviewed how to use her stretches   Cable row 3x10 10lbs  Cable extension 3x10 10 lbs   Reviewed set up of cables       PATIENT EDUCATION:  Education details: HEP, symptom management, benefits of PT, benefits and risks of TPDN,  Person educated: Patient Education method: Explanation, Demonstration, Tactile cues, Verbal cues, and Handouts Education comprehension: verbalized understanding     HOME EXERCISE PROGRAM: Access Code: [id number]     ASSESSMENT:   CLINICAL  IMPRESSION: The patient tolerated needling well. She had a good twitch response. We reviewed exercises that are good for both her core and her knee. She will have some stressful things over the next few weeks. We talked about letting her shoulders down. We will continue to work on core strengthening and soft tissue mobilization over the next few weeks. We will continue 1W6 to develop and strength and self management program. See below for goal specific progress.   OBJECTIVE IMPAIRMENTS decreased ROM, decreased strength, increased muscle spasms, and pain.    ACTIVITY LIMITATIONS  when pain comes on it can effect whatever activity she is doing    PARTICIPATION LIMITATIONS:  any activity she perfoming at the time   PERSONAL FACTORS 1-2 comorbidities: ADHD, history of migraines   are also affecting patient's functional outcome.    REHAB POTENTIAL: Good   CLINICAL DECISION MAKING: Evolving/moderate complexity increasing frequency of headaches that effect her ability to perform ADL'S   EVALUATION COMPLEXITY: Moderate     GOALS: Goals reviewed with patient? Yes   SHORT TERM GOALS: Target date:11/2   Patient will improve cervical AROM to full and pain-free. Baseline:  Goal status: mild pain at end range    2.  Patient will improve generalized shoulder  strength to 5/5 bil. Baseline:  Goal status:improving ER    3.  Patient will report having no cervical pain at rest. Baseline:  Goal status: archived    LONG TERM GOALS: Target date:11/23   Patient will report a decrease in weekly headache frequency to 1 or less a week  Baseline:  Goal status: achieved    2.  Patient will report a decrease in headache severity to 5/10 NPRS to indicate MCID. Baseline:  Goal status: significant decrease in intensity of headache    3.  Patient will be independent with gym/exercise program to maintain and improve current level of fitness. Baseline:  Goal status: will progress towards gym activity         PLAN: PT FREQUENCY: 1-2x/week   PT DURATION: 6 weeks   PLANNED INTERVENTIONS: Therapeutic exercises, Therapeutic activity, Neuromuscular re-education, Balance training, Gait training, Patient/Family education, Self Care, Joint mobilization, Dry Needling, Cryotherapy, and Moist heat   PLAN FOR NEXT SESSION: Dry needling and soft tissue for TrP, PRN. Cervical strength/stability exercises, postural stability. Continue with gym exercises  *next visit is last approved*     Lorayne Bender PT DPT  03/05/22 12:44 PM

## 2022-03-10 ENCOUNTER — Ambulatory Visit (HOSPITAL_BASED_OUTPATIENT_CLINIC_OR_DEPARTMENT_OTHER): Payer: Medicaid Other | Admitting: Cardiology

## 2022-03-12 ENCOUNTER — Ambulatory Visit (HOSPITAL_BASED_OUTPATIENT_CLINIC_OR_DEPARTMENT_OTHER): Payer: Medicaid Other | Admitting: Physical Therapy

## 2022-03-12 ENCOUNTER — Encounter (HOSPITAL_BASED_OUTPATIENT_CLINIC_OR_DEPARTMENT_OTHER): Payer: Self-pay | Admitting: Physical Therapy

## 2022-03-12 DIAGNOSIS — M542 Cervicalgia: Secondary | ICD-10-CM | POA: Diagnosis not present

## 2022-03-12 DIAGNOSIS — M62838 Other muscle spasm: Secondary | ICD-10-CM

## 2022-03-12 NOTE — Therapy (Signed)
OUTPATIENT PHYSICAL THERAPY CERVICAL Treatment    Patient Name: Alyssa Bennett MRN: [id number] DOB:01-Jul-2000, 21 y.o., female Today's Date: 03/04/2022   PT End of Session - 03/12/22 1450     Visit Number 11    Number of Visits 16    Date for PT Re-Evaluation 04/16/22    Authorization Time Period 4 visits approved from  01/19/22-02/17/22    PT Start Time 1015    PT Stop Time 1057    PT Time Calculation (min) 42 min    Activity Tolerance Patient tolerated treatment well    Behavior During Therapy [facility] for tasks assessed/performed                     Past Medical History:  Diagnosis Date   ADHD (attention deficit hyperactivity disorder)    Atypical chest pain 12/25/2021   Migraine    Past Surgical History:  Procedure Laterality Date   TONSILLECTOMY     Patient Active Problem List   Diagnosis Date Noted   Dizziness, nonspecific 02/20/2022   Tinea 02/17/2022   Pre-diabetes 02/10/2022   Vaginal odor 02/02/2022   Acute pain of left knee 02/02/2022   Acute right ankle pain 02/02/2022   Hand pain, right 02/02/2022   Bulimia nervosa 12/31/2021   PTSD (post-traumatic stress disorder) 12/31/2021   Atypical chest pain 12/25/2021   Palpitations 12/04/2021   Stabbing headache 11/09/2021   Gastroesophageal reflux disease 11/09/2021   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    SUBJECTIVE:                                                                                                                                                                                                          SUBJECTIVE STATEMENT: The patient reports over the past few days she has had a little shooting. She has also been having knee pain.   PERTINENT HISTORY:  Anxiety, ADHD   PAIN:  Are you having pain?  Yes: NPRS scale: 4-5/10 Pain location: neck Pain description: aching  Aggravating factors: sleeping on stomach Relieving factors: Migraine relief strategies - dark room, gentle pressure over temporalis   PRECAUTIONS: None   WEIGHT BEARING RESTRICTIONS No   FALLS:  Has patient fallen in last 6 months? No   LIVING ENVIRONMENT: Lives with: lives alone Lives in: House/apartment   OCCUPATION: Retail   PLOF: Independent   PATIENT GOALS   Reduce frequency/severity of headaches, improve neck range of motion   OBJECTIVE:    DIAGNOSTIC FINDINGS:  No recent imaging.  POSTURE: rounded shoulders and forward head   PALPATION: Tender to palpation of generalized R cervical musculature. Concordant pain with trigger points at C3/4 in UT and in temporalis    CERVICAL ROM:     Active ROM AROM (deg) eval AROM 8/4  Flexion WFL Full   Extension [facility] Full   Right lateral flexion Full, pain on R/stiff  30  Left lateral flexion Full, pain on R 36  Right rotation WFL   Left rotation WFL    (Blank rows = not tested)   8/4: Can feel the stretch both ways but more to the Left  UPPER EXTREMITY ROM:   Full active motion of the shoulders      Active ROM Right eval Left eval  Shoulder flexion [facility]  Shoulder extension      Shoulder abduction [facility]  Shoulder adduction      Shoulder extension      Shoulder internal rotation      Shoulder external rotation [facility] WFL  Middle trapezius      Lower trapezius      Elbow flexion      Elbow extension      Wrist flexion       Wrist extension      Wrist ulnar deviation      Wrist radial deviation      Wrist pronation      Wrist supination      Grip strength       (Blank rows = not tested)  shoulder flexion 5/5  Shoulder IR 5//5  Shoulder er 4+/5 on the right     TODAY'S TREATMENT:  10/20 Trigger Point Dry Needling, Manual Therapy Treatment:  Initial or subsequent education regarding Trigger Point Dry Needling: Subsequent Did patient give consent to treatment with Trigger Point Dry Needling: Yes TPDN of bilateral UT 2 spots .30x50 needle sub-occipitals 2 spots  Response: great twitch response    Bilateral er 2x10 red Bilateral horizontal abduction 2x10 red Bilateral flexion  2x10 red  band   10/13 Trigger Point Dry Needling, Manual Therapy Treatment:  Initial or subsequent education regarding Trigger Point Dry Needling: Subsequent Did patient give consent to treatment with Trigger Point Dry Needling: Yes TPDN of bilateral UT 2 spots .30x50 needle sub-occipitals 2 spots  Response: great twitch response   Reviewed core and postural exercises that can also help the knee Bridging 2x15  SLR 3x10 Supine hip abduction 3x10  Seated heel raise x20     9/22 MANUAL: suboccipital release, Lt upper trap STM, cervical traction Supine ab set Ab set + UE flexion, + marching: seated and standing  Gym machines: knee ext, HS curls, leg press Discussed bed postures for sleeping and muscular engagement vs support via ligamentous structures.    9/6 Trigger Point Dry Needling, Manual Therapy Treatment:  Initial or subsequent education regarding Trigger Point Dry Needling: Subsequent Did patient give consent to treatment with Trigger  Point Dry Needling: Yes TPDN of bilateral UT 2 spots .30x50 needle   TPDN with skilled palpation and monitoring followed by STM to the following muscles: bil upper trap, Rt suboccipitals, Lt levator scap  Reviewed how to use her stretches   Cable row 3x10 10lbs  Cable  extension 3x10 10 lbs   Reviewed set up of cables       PATIENT EDUCATION:  Education details: HEP, symptom management, benefits of PT, benefits and risks of TPDN,  Person educated: Patient Education method: Explanation, Demonstration, Tactile cues, Verbal cues, and Handouts Education comprehension: verbalized understanding     HOME EXERCISE PROGRAM: Access Code: [id number]     ASSESSMENT:   CLINICAL IMPRESSION: The patient reports the sharp pain has gone away. She is having more of a constant headache. She reports an aura with the headache. She was advised that if this persists she should contact her neurologist or primary care. Therapy preformed needling and soft tissue  mobilization to upper traps and sub-occipitals today. She had a great twitch. We also reviewed there-ex. Therapy ill continue to progress there-ex as tolerated.    OBJECTIVE IMPAIRMENTS decreased ROM, decreased strength, increased muscle spasms, and pain.    ACTIVITY LIMITATIONS  when pain comes on it can effect whatever activity she is doing    PARTICIPATION LIMITATIONS:  any activity she perfoming at the time   PERSONAL FACTORS 1-2 comorbidities: ADHD, history of migraines   are also affecting patient's functional outcome.    REHAB POTENTIAL: Good   CLINICAL DECISION MAKING: Evolving/moderate complexity increasing frequency of headaches that effect her ability to perform ADL'S   EVALUATION COMPLEXITY: Moderate     GOALS: Goals reviewed with patient? Yes   SHORT TERM GOALS: Target date:11/2   Patient will improve cervical AROM to full and pain-free. Baseline:  Goal status: mild pain at end range    2.  Patient will improve generalized shoulder  strength to 5/5 bil. Baseline:  Goal status:improving ER    3.  Patient will report having no cervical pain at rest. Baseline:  Goal status: archived    LONG TERM GOALS: Target date:11/23   Patient will report a decrease in weekly headache frequency to  1 or less a week  Baseline:  Goal status: achieved    2.  Patient will report a decrease in headache severity to 5/10 NPRS to indicate MCID. Baseline:  Goal status: significant decrease in intensity of headache    3.  Patient will be independent with gym/exercise program to maintain and improve current level of fitness. Baseline:  Goal status: will progress towards gym activity        PLAN: PT FREQUENCY: 1-2x/week   PT DURATION: 6 weeks   PLANNED INTERVENTIONS: Therapeutic exercises, Therapeutic activity, Neuromuscular re-education, Balance training, Gait training, Patient/Family education, Self Care, Joint mobilization, Dry Needling, Cryotherapy, and Moist heat   PLAN FOR NEXT SESSION: Dry needling and soft tissue for TrP, PRN. Cervical strength/stability exercises, postural stability. Continue with gym exercises  *next visit is last approved*     Carolyne Littles PT DPT  03/12/22 2:53 PM

## 2022-03-19 ENCOUNTER — Ambulatory Visit (HOSPITAL_BASED_OUTPATIENT_CLINIC_OR_DEPARTMENT_OTHER): Payer: Medicaid Other | Admitting: Nurse Practitioner

## 2022-03-19 ENCOUNTER — Ambulatory Visit (HOSPITAL_BASED_OUTPATIENT_CLINIC_OR_DEPARTMENT_OTHER): Payer: Medicaid Other | Admitting: Physical Therapy

## 2022-03-22 ENCOUNTER — Encounter: Payer: Medicaid Other | Admitting: Registered"

## 2022-03-25 ENCOUNTER — Encounter (HOSPITAL_BASED_OUTPATIENT_CLINIC_OR_DEPARTMENT_OTHER): Payer: Self-pay | Admitting: Cardiology

## 2022-03-26 ENCOUNTER — Ambulatory Visit (HOSPITAL_BASED_OUTPATIENT_CLINIC_OR_DEPARTMENT_OTHER): Payer: Medicaid Other | Admitting: Physical Therapy

## 2022-03-27 ENCOUNTER — Encounter (HOSPITAL_BASED_OUTPATIENT_CLINIC_OR_DEPARTMENT_OTHER): Payer: Self-pay | Admitting: Nurse Practitioner

## 2022-03-30 NOTE — Telephone Encounter (Signed)
Please call Alyssa Bennett to discuss patient assistance options with her to see if this is possible. If she is having multiple falls, we really need to get her into ortho as this is not something that I would be able to correct and the visit would be wasted.   For her medications, I don't know what options we have other than her contacting the manufacturer to see if there is a patient assistance program available. Typically shaking is not a side effect of stopping vyvanse so this may be from something completely different. Withdrawal can occur when vyvanse is stopped suddenly after it has been taking for longer than 6 months. Usually we see more withdrawal symptoms from patients on high doses. It can indicate that the dose was too high and if the medication is restarted it will need a lower starting and maintenance dose. She may notice increased anxiety or depressive symptoms, irritability, poor concentration, and/or increased appetite as these are the typical effects seen when stopping the medication. Fortunately, she is over the 2 week mark and symptoms of withdrawal usually improve by week 3 so she should be in the home stretch.

## 2022-04-07 ENCOUNTER — Other Ambulatory Visit (HOSPITAL_BASED_OUTPATIENT_CLINIC_OR_DEPARTMENT_OTHER): Payer: Self-pay | Admitting: Nurse Practitioner

## 2022-04-07 DIAGNOSIS — L503 Dermatographic urticaria: Secondary | ICD-10-CM

## 2022-04-22 ENCOUNTER — Other Ambulatory Visit (HOSPITAL_BASED_OUTPATIENT_CLINIC_OR_DEPARTMENT_OTHER): Payer: Self-pay | Admitting: Nurse Practitioner

## 2022-04-22 DIAGNOSIS — E559 Vitamin D deficiency, unspecified: Secondary | ICD-10-CM

## 2022-05-04 ENCOUNTER — Ambulatory Visit (HOSPITAL_BASED_OUTPATIENT_CLINIC_OR_DEPARTMENT_OTHER): Payer: Medicaid Other | Admitting: Family

## 2022-05-04 ENCOUNTER — Encounter (HOSPITAL_BASED_OUTPATIENT_CLINIC_OR_DEPARTMENT_OTHER): Payer: Self-pay | Admitting: Family

## 2022-05-04 VITALS — BP 122/86 | HR 80 | Ht 60.0 in | Wt 186.0 lb

## 2022-05-04 DIAGNOSIS — R42 Dizziness and giddiness: Secondary | ICD-10-CM | POA: Diagnosis not present

## 2022-05-04 DIAGNOSIS — Z8249 Family history of ischemic heart disease and other diseases of the circulatory system: Secondary | ICD-10-CM | POA: Diagnosis not present

## 2022-05-04 DIAGNOSIS — I951 Orthostatic hypotension: Secondary | ICD-10-CM

## 2022-05-04 DIAGNOSIS — R002 Palpitations: Secondary | ICD-10-CM | POA: Diagnosis not present

## 2022-05-04 NOTE — Progress Notes (Unsigned)
Office Visit    Patient Name: Alyssa Bennett Date of Encounter: 05/04/2022  PCP:  Tollie Eth, NP   [facility]  Cardiologist:  Chilton Si, MD  Advanced Practice Provider:  No care team member to display Electrophysiologist:  None      Chief Complaint    Alyssa Bennett is a 21 y.o. female presents today for follow up after discontinuation of Vyvanse  Past Medical History    Past Medical History:  Diagnosis Date   ADHD (attention deficit hyperactivity disorder)    Atypical chest pain 12/25/2021   Migraine    Past Surgical History:  Procedure Laterality Date   TONSILLECTOMY      Allergies  Allergies  Allergen Reactions   Zithromax [Azithromycin] Itching and Rash    History of Present Illness    Alyssa Bennett is a 21 y.o. female with a hx of ADHD, Tourette's, palpitations, orthostatic hypotension last seen 02/22/22.  Seen in consult 12/25/21 with chest flutters described as "heart stops and starts again". Was most often associated with anxiety. She did have some exertional chest discomfort and family history of CAD with father passing from heart attack at 54 years old. Cardiac CTA 01/13/22 coronary calcium score of 0 and no significant extracardiac findings. Monitor preliminary report with predominantly NSR average heart rate 92 bpm, rare PVC/PAC <1% burden, no significant arrhythmia.  Seen 01/27/2022 and reassuring as provided.  Discussions to stable hydrated, avoid caffeine to prevent palpitations.  She saw Dr. Cristal Deer 02/22/2022 and was orthostatic with dizzy spells with changing positions.  She was encouraged to discuss trialing off Vyvanse with her other provider.  She presents today for follow up. Notes feeling nauseous when she eats. Notes she has difficulty monitoring her food intake as had previous therapist tell her she had bulimia. She notes both lightheadedness and dizziness. Since stopping Vyvanse she is not certain if it is any  better. She plans to discuss with PCP as she had to stop suddenly as ran out and her insurance has a lapse but it has since resumed.   She is drinking more caffeine since stopping Vyvanse to help with focus and to help go to sleep per her report. Attempting to drink four 16 oz bottles of water per day. She has been drinking Body Armour for electrolytes. She is currently out of them. She has been adding some salt. Does feel a bit better on days she drinks the Body Armour.   Orthostatic vitals: Laying 138/82 HR 97 bpm Sitting 122/65 HR 93 bpm Standing 136/75 HR 116bpm Standing 3 minutes 130/76 HR 102 bpm  EKGs/Labs/Other Studies Reviewed:   The following studies were reviewed today: Cardiac CTA 01/13/2022 FINDINGS: Coronary calcium score: The patient's coronary artery calcium score is 0, which places the patient in the 0 percentile.   Coronary arteries: Normal coronary origins.  Right dominance.   Right Coronary Artery: Normal caliber vessel, gives rise to PDA. No significant plaque or stenosis.   Left Main Coronary Artery: Normal caliber vessel. No significant plaque or stenosis.   Left Anterior Descending Coronary Artery: Normal caliber vessel. No significant plaque or stenosis. Gives rise to 2 diagonal branches.   Left Circumflex Artery: Normal caliber vessel. No significant plaque or stenosis. Gives rise to 2 OM branches.   Aorta: Normal size, 22 mm at the mid ascending aorta (level of the PA bifurcation) measured double oblique. No aortic atherosclerosis. No dissection seen in visualized portions of the aorta.  Aortic Valve: No calcifications. Trileaflet.   Other findings:   Normal pulmonary vein drainage into the left atrium.   Normal left atrial appendage without a thrombus.   Normal size of the pulmonary artery.   Normal appearance of the pericardium.   IMPRESSION: 1. No evidence of CAD, CADRADS = 0.   2. Coronary calcium score of 0. This was 0 percentile for  age and sex matched control.   3. Normal coronary origin with right dominance.  EKG:  EKG is not ordered today.   Recent Labs: 11/02/2021: ALT 24; BUN 13; Creatinine, Ser 0.51; Hemoglobin 14.7; Platelets 360; Potassium 4.3; Sodium 140; TSH 2.250  Recent Lipid Panel    Component Value Date/Time   CHOL 173 11/02/2021 1132   TRIG 153 (H) 11/02/2021 1132   HDL 46 11/02/2021 1132   CHOLHDL 3.8 11/02/2021 1132   LDLCALC 100 (H) 11/02/2021 1132    Home Medications   Current Meds  Medication Sig   clotrimazole (ANTIFUNGAL, CLOTRIMAZOLE,) 1 % cream Apply 1 Application topically 2 (two) times daily. For athletes foot.   DULoxetine (CYMBALTA) 20 MG capsule TAKE 1 CAPSULE BY MOUTH EVERY DAY   esomeprazole (NEXIUM) 40 MG capsule One tab by mouth at dinner time.   glucose blood (ACCU-CHEK GUIDE) test strip To check blood sugar at least twice a day and additionally as needed for signs of low or high blood sugar.   levocetirizine (XYZAL) 5 MG tablet TAKE 1 TABLET BY MOUTH EVERY DAY IN THE EVENING   methylphenidate (RITALIN) 10 MG tablet Take 1 tablet (10 mg total) by mouth daily in the afternoon.   methylPREDNISolone (MEDROL DOSEPAK) 4 MG TBPK tablet Take per packet instructions   norethindrone-ethinyl estradiol-FE (LOESTRIN FE 1/20) 1-20 MG-MCG tablet Take 1 tablet by mouth daily.   ondansetron (ZOFRAN) 8 MG tablet Take 1 tablet (8 mg total) by mouth every 8 (eight) hours as needed for nausea or vomiting.   rizatriptan (MAXALT) 10 MG tablet Take 1 tablet (10 mg total) by mouth as needed for migraine. May repeat in 2 hours if needed   Vitamin D, Ergocalciferol, (DRISDOL) 1.25 MG (50000 UNIT) CAPS capsule TAKE 1 CAPSULE EVERY 7 (SEVEN) DAYS. TAKE FOR 12 TOTAL DOSES THAN CAN TRANSITION TO 1000IU OTC DAILY   Current Facility-Administered Medications for the 05/04/22 encounter (Office Visit) with Alver Sorrow, NP  Medication   cyanocobalamin (VITAMIN B12) injection 1,000 mcg     Review of  Systems      All other systems reviewed and are otherwise negative except as noted above.  Physical Exam    VS:  BP 122/86   Pulse 80   Ht 5' (1.524 m)   Wt 186 lb (84.4 kg)   BMI 36.33 kg/m  , BMI Body mass index is 36.33 kg/m.  Wt Readings from Last 3 Encounters:  05/04/22 186 lb (84.4 kg)  02/22/22 176 lb 3.2 oz (79.9 kg)  02/02/22 179 lb 3.2 oz (81.3 kg)    GEN: Well nourished, well developed, in no acute distress. HEENT: normal. Neck: Supple, no JVD, carotid bruits, or masses. Cardiac: RRR, no murmurs, rubs, or gallops. No clubbing, cyanosis, edema.  Radials/PT 2+ and equal bilaterally.  Respiratory:  Respirations regular and unlabored, clear to auscultation bilaterally. GI: Soft, nontender, nondistended. MS: No deformity or atrophy. Skin: Warm and dry, no rash. Neuro:  Strength and sensation are intact. Psych: Normal affect.  Assessment & Plan    Orthostatic hypotension - ZIO and cardiac CTA previously  unrevealing. Previous orthostasis while on Vyvanse. Vital signs today do not show orthostatic hypotension. Continued orthostatic precautions encouraged. Given persistent lightheadedness, will order echocardiogram to complete cardiac evaluation. However I anticipate her lightheadedness and dizziness are non cardiac. Likely related to anxiety, dehydration. No carotid bruit or amaurosis fugax, no indication for carotid duplex. Encouraged to follow up with primary care regarding dizziness and lightheadedness.   Palpitations -ZIO monitor preliminary report with probably normal sinus rhythm, rare PVC/PAC with less than 1% burden.  No significant arrhythmia to cause palpitations.  No indication for AV nodal blocking therapy.  .  Recommend avoidance of caffeine, staying adequately hydrated, managing stress well.  Anticipate her palpitations are predominantly related to caffeine and anxiety.  Family history of heart disease -cardiac CTA 01/10/2022 with coronary calcium score of 0.   Lipid panel 10/2021 total cholesterol 173, triglycerides 123, HDL 46, LDL 100.  No indication for lipid-lowering therapy this time.  Lipid-lowering diet and regular cardiovascular exercise encouraged.  Discussed goal of 150 minutes of moderate intensity exercise per week.       Disposition: Follow up prn pending test results with Chilton Si, MD or APP.  Signed, Alver Sorrow, NP 05/04/2022, 10:24 AM  [facility]

## 2022-05-04 NOTE — Patient Instructions (Signed)
Medication Instructions:   Try Meclizine over the counter for dizziness like spinning sensation.   *If you need a refill on your cardiac medications before your next appointment, please call your pharmacy*   Lab Work/Testing/Procedures: Your physician has requested that you have an echocardiogram. Echocardiography is a painless test that uses sound waves to create images of your heart. It provides your doctor with information about the size and shape of your heart and how well your heart's chambers and valves are working. This procedure takes approximately one hour. There are no restrictions for this procedure. Please do NOT wear cologne, perfume, aftershave, or lotions (deodorant is allowed). Please arrive 15 minutes prior to your appointment time.   Follow-Up: At [facility], you and your health needs are our priority.  As part of our continuing mission to provide you with exceptional heart care, we have created designated Provider Care Teams.  These Care Teams include your primary Cardiologist (physician) and Advanced Practice Providers (APPs -  Physician Assistants and Nurse Practitioners) who all work together to provide you with the care you need, when you need it.  We recommend signing up for the patient portal called "MyChart".  Sign up information is provided on this After Visit Summary.  MyChart is used to connect with patients for Virtual Visits (Telemedicine).  Patients are able to view lab/test results, encounter notes, upcoming appointments, etc.  Non-urgent messages can be sent to your provider as well.   To learn more about what you can do with MyChart, go to [email or web address].    Your next appointment:   As needed based on echo results   Other Instructions  To prevent palpitations: Make sure you are adequately hydrated.  Avoid and/or limit caffeine containing beverages like soda or tea. Exercise regularly.  Manage stress well. Some over the counter  medications can cause palpitations such as Benadryl, AdvilPM, TylenolPM. Regular Advil or Tylenol do not cause palpitations.    [facility] has moved to a new location:  [facility] [address] [place], [place] [address] [phone]

## 2022-05-05 ENCOUNTER — Encounter (HOSPITAL_BASED_OUTPATIENT_CLINIC_OR_DEPARTMENT_OTHER): Payer: Self-pay | Admitting: Family

## 2022-05-10 IMAGING — CT CT ABD-PELV W/ CM
2 of 4 series · 16 of 46 positions shown, 18 images · IV contrast (omnipaque)
Comparison: None.

CLINICAL DATA: Right lower quadrant pain.

EXAM:
CT ABDOMEN AND PELVIS WITH CONTRAST
TECHNIQUE: Multidetector CT imaging of the abdomen and pelvis was performed
using the standard protocol following bolus administration of
intravenous contrast.
CONTRAST:  100mL OMNIPAQUE IOHEXOL 300 MG/ML  SOLN

[Series 2: abd pel w · axial · 0.77mm/px · z∈[-286,+109]mm · 13 of 87 slices shown, 15 images]
[im 4/87  soft-tissue]
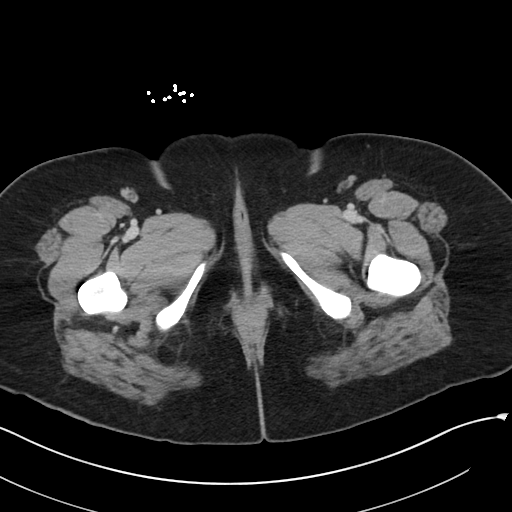
[im 4/87  bone]
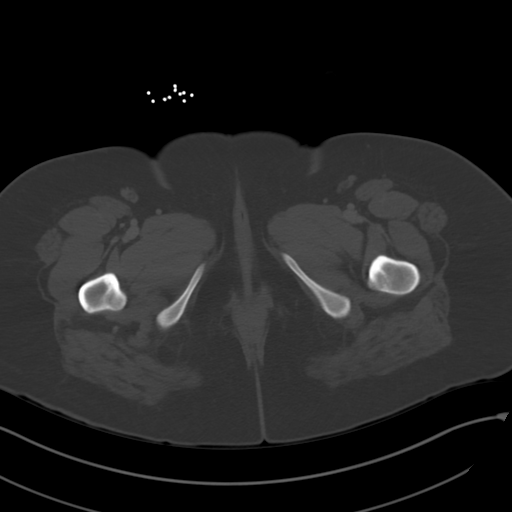
[im 12/87  soft-tissue]
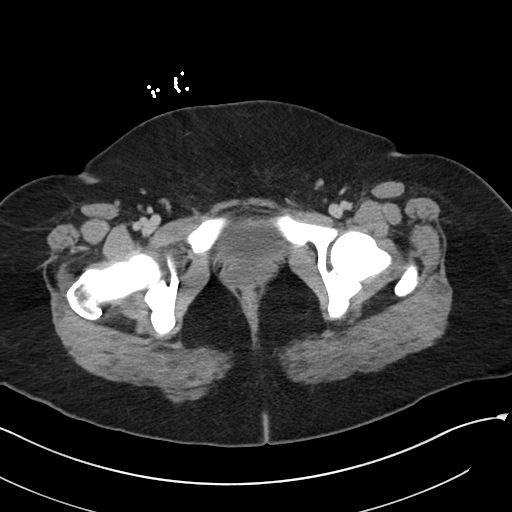
[im 19/87  soft-tissue]
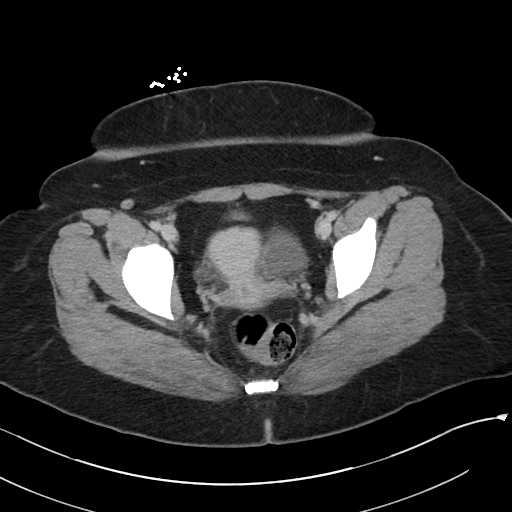
[im 23/87  soft-tissue]
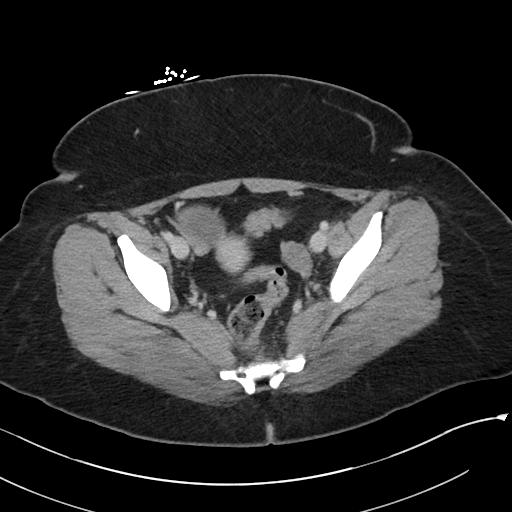
[im 30/87  soft-tissue]
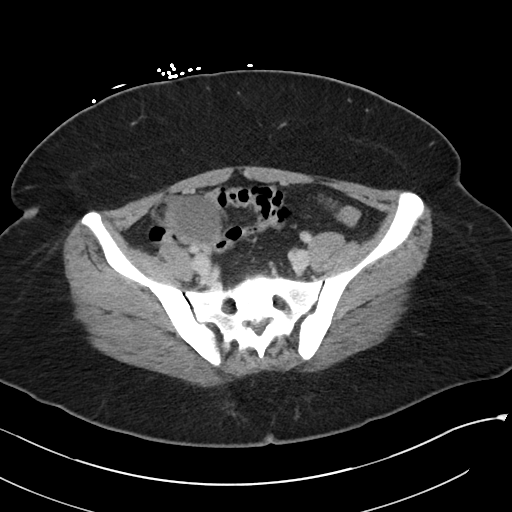
[im 38/87  soft-tissue]
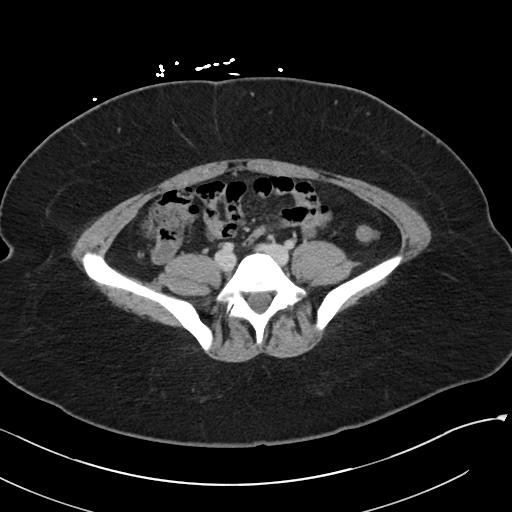
[im 45/87  soft-tissue]
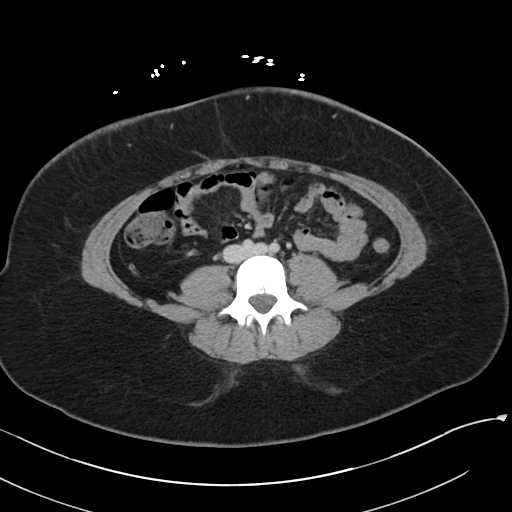
[im 49/87  soft-tissue]
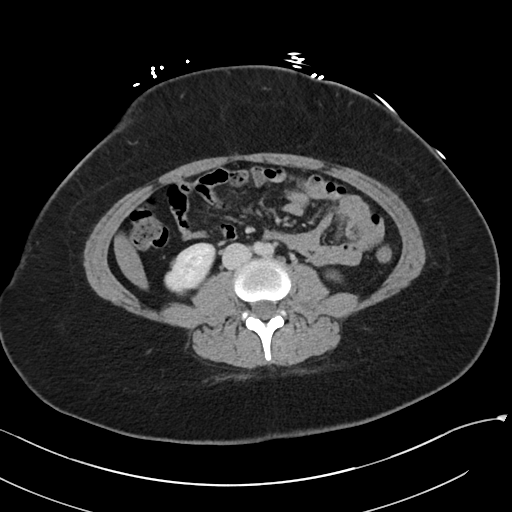
[im 57/87  soft-tissue]
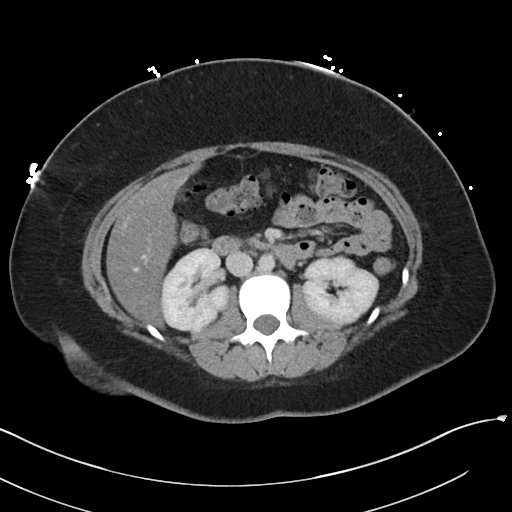
[im 57/87  bone]
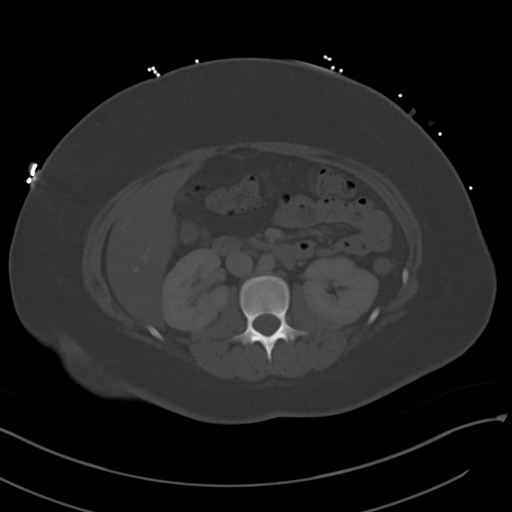
[im 64/87  soft-tissue]
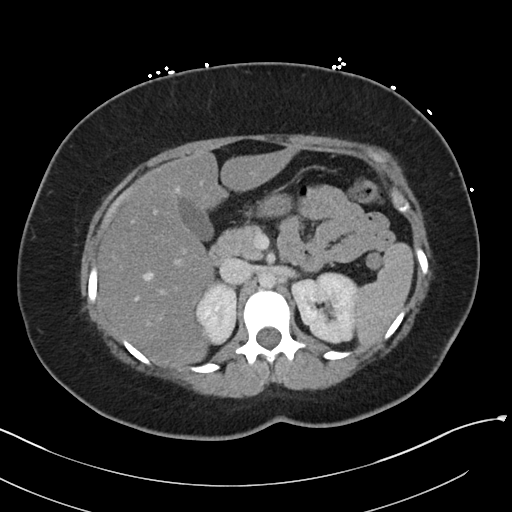
[im 68/87  soft-tissue]
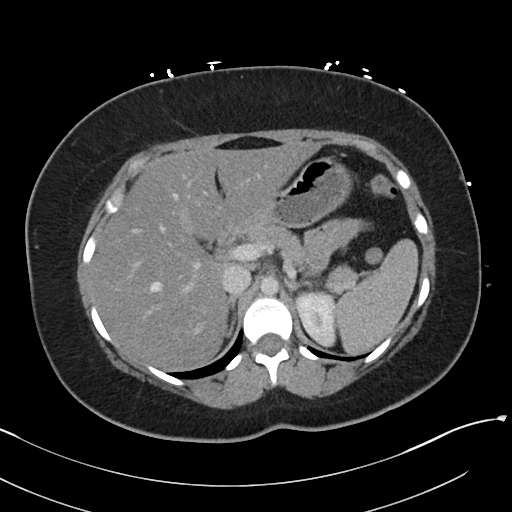
[im 75/87  soft-tissue]
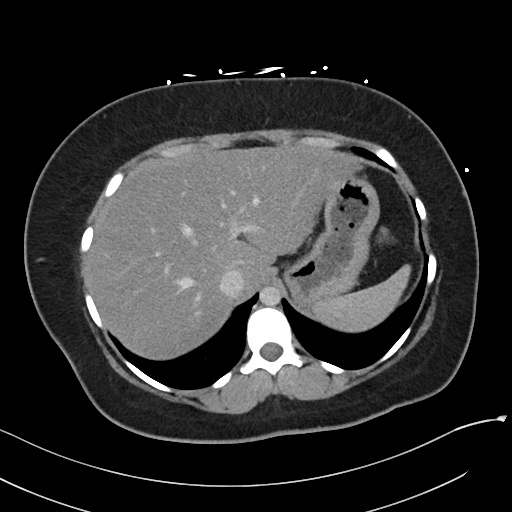
[im 83/87  soft-tissue]
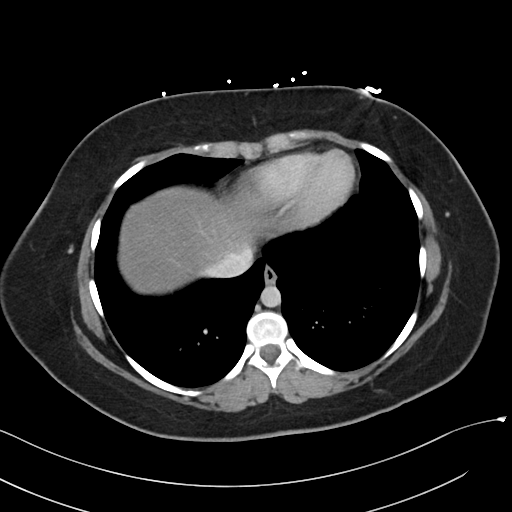

[Series 5: coronal · coronal · 0.89mm/px · 3 of 102 slices shown]
[im 34/102  soft-tissue]
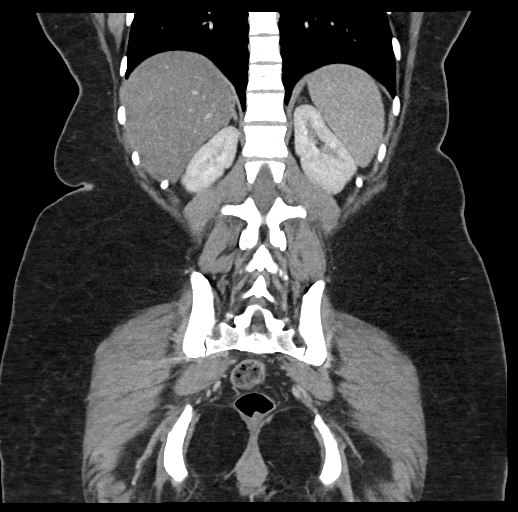
[im 45/102  soft-tissue]
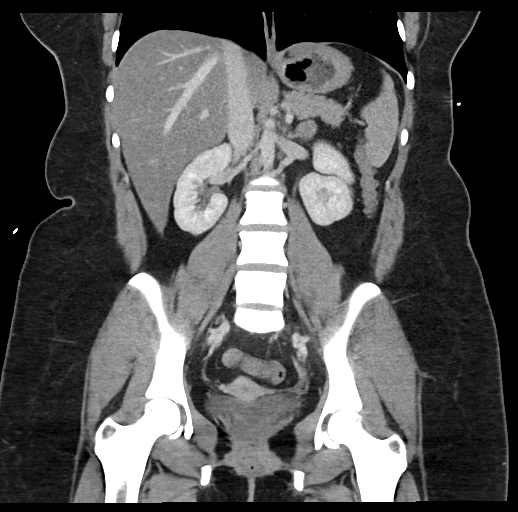
[im 57/102  soft-tissue]
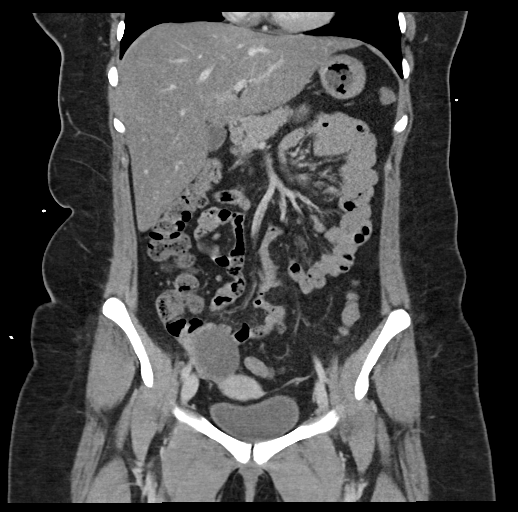

[16 of 46 positions shown; findings below may reference images not displayed]

FINDINGS: Lower chest: No acute abnormality.

Hepatobiliary: 20 cm length moderately steatotic liver without mass
enhancement. The gallbladder and bile ducts are unremarkable.

Pancreas: Unremarkable. No pancreatic ductal dilatation or
surrounding inflammatory changes.

Spleen: No mass enhancement or splenomegaly.

Adrenals/Urinary Tract: Adrenal glands are unremarkable. Kidneys are
normal, without renal calculi, focal lesion, or hydronephrosis.
Bladder is unremarkable.

Stomach/Bowel: The gastric wall, unopacified small bowel and
appendix are unremarkable. There is mild fecal stasis. Left colonic
scattered diverticula without diverticulitis.

Vascular/Lymphatic: No significant vascular findings are present. No
enlarged abdominal or pelvic lymph nodes.

Reproductive: There is a 5.4 x 5.4 x 4.3 cm, likely cystic
low-density lesion of the right ovary of 27 Hounsfield units above
the usual density of simple fluid. It is probably a hemorrhagic
cyst. There is no edema adjacent the right ovary. The left ovary is
unremarkable. The uterus is intact and unremarkable.

Other: There is trace low-density fluid in the pelvic cul-de-sac and
adnexal areas. There is no free air, abscess or hemorrhage. Small
umbilical fat hernia.

Musculoskeletal: No acute or significant osseous findings.
IMPRESSION: 1. 5.4 x 5.4 x 4.3 cm right ovarian low-density lesion above the
typical density of fluid, most likely is a hemorrhagic cyst. Since
there is no other etiology identified for right lower quadrant pain,
pelvic ultrasound should be obtained to confirm preservation of flow
to the right ovary.
2. 20 cm in length moderately steatotic liver. No splenomegaly. No
portal vein dilatation.
3. Scattered left colonic diverticula without diverticulitis
findings. Constipation.
4. Trace low-density fluid in the pelvis, could be physiologic or
due to cyst leakage. No free hemorrhage.
5. Umbilical fat hernia.

## 2022-05-11 ENCOUNTER — Encounter (HOSPITAL_BASED_OUTPATIENT_CLINIC_OR_DEPARTMENT_OTHER): Payer: Self-pay | Admitting: Family Medicine

## 2022-05-11 ENCOUNTER — Ambulatory Visit (INDEPENDENT_AMBULATORY_CARE_PROVIDER_SITE_OTHER): Payer: Medicaid Other | Admitting: Family Medicine

## 2022-05-11 ENCOUNTER — Ambulatory Visit (HOSPITAL_BASED_OUTPATIENT_CLINIC_OR_DEPARTMENT_OTHER): Payer: Medicaid Other | Attending: Nurse Practitioner | Admitting: Physical Therapy

## 2022-05-11 VITALS — BP 128/67 | HR 94 | Ht 60.0 in | Wt 187.0 lb

## 2022-05-11 DIAGNOSIS — M542 Cervicalgia: Secondary | ICD-10-CM | POA: Diagnosis present

## 2022-05-11 DIAGNOSIS — M62838 Other muscle spasm: Secondary | ICD-10-CM | POA: Insufficient documentation

## 2022-05-11 DIAGNOSIS — F4310 Post-traumatic stress disorder, unspecified: Secondary | ICD-10-CM

## 2022-05-11 DIAGNOSIS — F988 Other specified behavioral and emotional disorders with onset usually occurring in childhood and adolescence: Secondary | ICD-10-CM

## 2022-05-11 DIAGNOSIS — Z23 Encounter for immunization: Secondary | ICD-10-CM

## 2022-05-11 DIAGNOSIS — F411 Generalized anxiety disorder: Secondary | ICD-10-CM

## 2022-05-11 DIAGNOSIS — R42 Dizziness and giddiness: Secondary | ICD-10-CM

## 2022-05-11 DIAGNOSIS — F502 Bulimia nervosa: Secondary | ICD-10-CM

## 2022-05-11 NOTE — Therapy (Signed)
OUTPATIENT PHYSICAL THERAPY CERVICAL Treatment    Patient Name: Alyssa Bennett MRN: [id number] DOB:02/22/2001, 21 y.o., female Today's Date: 03/04/2022   PT End of Session - 05/11/22 1605     Visit Number 12    Number of Visits 20    Date for PT Re-Evaluation 07/07/22    Authorization Time Period 4 visits approved from  01/19/22-02/17/22    PT Start Time 1600    PT Stop Time 1643    PT Time Calculation (min) 43 min    Activity Tolerance Patient tolerated treatment well    Behavior During Therapy [facility] for tasks assessed/performed                     Past Medical History:  Diagnosis Date   ADHD (attention deficit hyperactivity disorder)    Atypical chest pain 12/25/2021   Migraine    Past Surgical History:  Procedure Laterality Date   TONSILLECTOMY     Patient Active Problem List   Diagnosis Date Noted   Dizziness, nonspecific 02/20/2022   Tinea 02/17/2022   Pre-diabetes 02/10/2022   Vaginal odor 02/02/2022   Acute pain of left knee 02/02/2022   Acute right ankle pain 02/02/2022   Hand pain, right 02/02/2022   Bulimia nervosa 12/31/2021   PTSD (post-traumatic stress disorder) 12/31/2021   Atypical chest pain 12/25/2021   Palpitations 12/04/2021   Stabbing headache 11/09/2021   Gastroesophageal reflux disease 11/09/2021   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    SUBJECTIVE:                                                                                                                                                                                                          SUBJECTIVE STATEMENT: The patient reports over the past few days she has had a little shooting. She has also been having knee pain.   PERTINENT HISTORY:  Anxiety, ADHD   PAIN:  Are you having pain?  Yes: NPRS scale: 4-5/10 Pain location: neck Pain description: aching  Aggravating factors: sleeping on stomach Relieving factors: Migraine relief strategies - dark room, gentle pressure over temporalis   PRECAUTIONS: None   WEIGHT BEARING RESTRICTIONS No   FALLS:  Has patient fallen in last 6 months? No   LIVING ENVIRONMENT: Lives with: lives alone Lives in: House/apartment   OCCUPATION: Retail   PLOF: Independent   PATIENT GOALS   Reduce frequency/severity of headaches, improve neck range of motion   OBJECTIVE:    DIAGNOSTIC FINDINGS:  No recent imaging.  POSTURE: rounded shoulders and forward head   PALPATION: Tender to palpation of generalized R cervical musculature. Concordant pain with trigger points at C3/4 in UT and in temporalis 12/19   CERVICAL ROM:     Active ROM AROM (deg) eval AROM 8/4 AROM  12/19  Flexion WFL Full  Full  Full   Extension [facility] Full  Full  Full   Right lateral flexion Full, pain on R/stiff  30    Left lateral flexion Full, pain on R 36    Right rotation [facility]  Full  Full   Left rotation WFL      (Blank rows = not tested)   8/4: Can feel the stretch both ways but more to the Left  UPPER EXTREMITY ROM:   Full active motion of the shoulders      Active ROM Right eval Left eval  Shoulder flexion [facility]  Shoulder extension      Shoulder abduction [facility]  Shoulder adduction      Shoulder extension      Shoulder internal rotation      Shoulder external rotation [facility] WFL  Middle trapezius      Lower trapezius      Elbow  flexion      Elbow extension      Wrist flexion      Wrist extension      Wrist ulnar deviation      Wrist radial deviation      Wrist pronation      Wrist supination      Grip strength       (Blank rows = not tested)   UPPER EXTREMITY MMT:  MMT Right eval Left eval  Shoulder flexion 4 4  Shoulder extension    Shoulder abduction    Shoulder adduction    Shoulder extension    Shoulder internal rotation 4 4  Shoulder external rotation 4 4  Middle trapezius    Lower trapezius    Elbow flexion    Elbow extension    Wrist flexion    Wrist extension    Wrist ulnar deviation    Wrist radial deviation    Wrist pronation    Wrist supination    Grip strength     (Blank rows = not tested)     TODAY'S TREATMENT:  12/19 Trigger Point Dry Needling, Manual Therapy Treatment:  Initial or subsequent education regarding Trigger Point Dry Needling: Subsequent Did patient give consent to treatment with Trigger Point Dry Needling: Yes TPDN of bilateral UT 2 spots .30x50 needle sub-occipitals 2 spots  Response: great twitch response   Bilateral er 2x10 red Bilateral horizontal abduction 2x10 red  Re-assessment: reviewed all goals and progress since last visit.     PATIENT EDUCATION:  Education details: HEP, symptom management, benefits of PT, benefits and risks of TPDN,  Person educated: Patient Education method: Explanation, Demonstration, Tactile cues, Verbal cues, and Handouts Education  comprehension: verbalized understanding     HOME EXERCISE PROGRAM: Access Code: [email or web address]     ASSESSMENT:   CLINICAL IMPRESSION: The patient returns following a long lay off 2nd to insurance. She reports she has been having high stress levels and has had increased pain in her upper traps and the shrp pain into her occiput has returned. She has tried her stretches without much successes. She returns to therapy to see if she can get her pain levels down and re-establish her exercise program.  She had a great twitch with needling today. See below for goal specific progress.   OBJECTIVE IMPAIRMENTS decreased ROM, decreased strength, increased muscle spasms, and pain.    ACTIVITY LIMITATIONS  when pain comes on it can effect whatever activity she is doing    PARTICIPATION LIMITATIONS:  any activity she perfoming at the time   PERSONAL FACTORS 1-2 comorbidities: ADHD, history of migraines   are also affecting patient's functional outcome.    REHAB POTENTIAL: Good   CLINICAL DECISION MAKING: Evolving/moderate complexity increasing frequency of headaches that effect her ability to perform ADL'S   EVALUATION COMPLEXITY: Moderate     GOALS: Goals reviewed with patient? Yes   SHORT TERM GOALS: Target date:11/2   Patient will improve cervical AROM to full and pain-free. Baseline:  Goal status: mild pain at end range 12/20   2.  Patient will improve generalized shoulder  strength to 5/5 bil. Baseline:  Goal status:4/5 today decreased from last re-assessment 12/20    3.  Patient will report having no cervical pain at rest. Baseline:  Goal status: archived    LONG TERM GOALS: Target date:11/23   Patient will report a decrease in weekly headache frequency to 1 or less a week  Baseline:  Goal status: back to having headaches    2.  Patient will report a decrease in headache severity to 5/10 NPRS to indicate MCID. Baseline:  Goal status: headaches have retruned 12/20    3.  Patient will be independent with gym/exercise program to maintain and improve current level of fitness. Baseline:  Goal status: will progress towards gym activity        PLAN: PT FREQUENCY: 1-2x/week   PT DURATION: 6 weeks   PLANNED INTERVENTIONS: Therapeutic exercises, Therapeutic activity, Neuromuscular re-education, Balance training, Gait training, Patient/Family education, Self Care, Joint mobilization, Dry Needling, Cryotherapy, and Moist heat   PLAN FOR NEXT SESSION: Dry needling and soft  tissue for TrP, PRN. Cervical strength/stability exercises, postural stability. Continue with gym exercises  *next visit is last approved*    Check all possible CPT codes: 03474 - PT Re-evaluation, 97110- Therapeutic Exercise, 97140 - Manual Therapy, 97530 - Therapeutic Activities, 97535 - Self Care, 97014 - Electrical stimulation (unattended), [id number] - Iontophoresis, [id number] - Ultrasound, and [id number] - Aquatic therapy    Check all conditions that are expected to impact treatment: none    If treatment provided at initial evaluation, no treatment charged due to lack of authorization.       Lorayne Bender PT DPT  05/12/22 2:28 PM

## 2022-05-11 NOTE — Progress Notes (Signed)
    Procedures performed today:    None.  Independent interpretation of notes and tests performed by another provider:   None.  Brief History, Exam, Impression, and Recommendations:    BP 128/67 (BP Location: Right Arm, Patient Position: Sitting, Cuff Size: Large)   Pulse 94   Ht 5' (1.524 m)   Wt 187 lb (84.8 kg)   SpO2 100%   BMI 36.52 kg/m   Dizziness, nonspecific Patient reports ongoing issues with dizziness.  She has discussed this previously with Minna Merritts, who she was seeing as her PCP.  She additionally has had evaluation with cardiology.  She most recently saw them last week.  She has had prior evaluation including heart monitor, cardiac CTA.  Each of these were generally unremarkable with no abnormal findings observed.  She also had evaluation for orthostatic hypotension in the office with cardiology, this was reassuring with no evidence of orthostasis.  Echocardiogram has been ordered and is still pending currently.  She reports that she continues having ongoing issues with intermittent dizziness which will fluctuate in severity from time to time.  She occasionally have nausea as well, rarely has any vomiting with it.  She does have a neurologist that she follows with, however has not discussed this with them previously. Today, vital signs in office are stable We discussed options regarding further evaluation and management.  We discussed reassuring findings for evaluation with cardiology.  Given continued symptoms, would recommend further discussing with her neurologist and assessing need for any further neurologic evaluation in attempts to identify underlying etiology.  She is requesting referral to ENT as well, referral to ENT placed today  Patient additionally has underlying mental health concerns/diagnoses including anxiety, history of bulimia, possible PTSD, ADD.  At 1 point she was taking Vyvanse in the past.  She is currently taking duloxetine, however this was started in  relation to chronic pain that she was experiencing related to occipital neuralgia.  Given ongoing symptoms and possible consideration for further pharmacotherapy to address underlying ADD, anxiety, patient would be interested in referral to psychiatry.  Feel that this would be reasonable and likely beneficial for patient to better control underlying mental health concerns.  Referral to psychiatry placed today  She additionally has questions about B12 injections.  Did review most recent labs which showed normal B12 level.  Given finding, do not feel that B12 injections would be required at this time.  If she would like to proceed with B12 supplementation, recommend low-dose of oral B12 supplement  Spent 43 minutes on this patient encounter, including preparation, chart review, face-to-face counseling with patient and coordination of care, and documentation of encounter  Return in about 4 months (around 09/10/2022).   ___________________________________________ Alyssa K[place], MD, ABFM, CAQSM Primary Care and Sports Medicine [facility]

## 2022-05-11 NOTE — Assessment & Plan Note (Signed)
Patient reports ongoing issues with dizziness.  She has discussed this previously with Minna Merritts, who she was seeing as her PCP.  She additionally has had evaluation with cardiology.  She most recently saw them last week.  She has had prior evaluation including heart monitor, cardiac CTA.  Each of these were generally unremarkable with no abnormal findings observed.  She also had evaluation for orthostatic hypotension in the office with cardiology, this was reassuring with no evidence of orthostasis.  Echocardiogram has been ordered and is still pending currently.  She reports that she continues having ongoing issues with intermittent dizziness which will fluctuate in severity from time to time.  She occasionally have nausea as well, rarely has any vomiting with it.  She does have a neurologist that she follows with, however has not discussed this with them previously. Today, vital signs in office are stable We discussed options regarding further evaluation and management.  We discussed reassuring findings for evaluation with cardiology.  Given continued symptoms, would recommend further discussing with her neurologist and assessing need for any further neurologic evaluation in attempts to identify underlying etiology.  She is requesting referral to ENT as well, referral to ENT placed today

## 2022-05-13 ENCOUNTER — Encounter (HOSPITAL_BASED_OUTPATIENT_CLINIC_OR_DEPARTMENT_OTHER): Payer: Self-pay | Admitting: Physical Therapy

## 2022-06-01 ENCOUNTER — Telehealth (HOSPITAL_BASED_OUTPATIENT_CLINIC_OR_DEPARTMENT_OTHER): Payer: Self-pay | Admitting: Family

## 2022-06-01 ENCOUNTER — Other Ambulatory Visit (HOSPITAL_BASED_OUTPATIENT_CLINIC_OR_DEPARTMENT_OTHER): Payer: Medicaid Other

## 2022-06-01 NOTE — Telephone Encounter (Signed)
Left message for patient to call and discuss rescheduling the 06/01/22 Echocardiogram appointment that was missed

## 2022-06-08 ENCOUNTER — Ambulatory Visit (HOSPITAL_BASED_OUTPATIENT_CLINIC_OR_DEPARTMENT_OTHER): Payer: Medicaid Other | Admitting: Physical Therapy

## 2022-06-08 NOTE — Telephone Encounter (Signed)
Left message for patient to call and discuss rescheduling the Echocardiogram ordered y Owens Shark, NP

## 2022-06-15 ENCOUNTER — Ambulatory Visit: Payer: Medicaid Other | Admitting: Psychiatry

## 2022-06-15 NOTE — Telephone Encounter (Signed)
Left message for patient to call and discuss rescheduling the 06/01/22 Echocardiogram ordered by Laurann Montana, NP

## 2022-06-16 ENCOUNTER — Encounter (HOSPITAL_BASED_OUTPATIENT_CLINIC_OR_DEPARTMENT_OTHER): Payer: Self-pay | Admitting: Family

## 2022-06-18 ENCOUNTER — Ambulatory Visit (HOSPITAL_BASED_OUTPATIENT_CLINIC_OR_DEPARTMENT_OTHER): Payer: Medicaid Other | Attending: Nurse Practitioner | Admitting: Physical Therapy

## 2022-06-18 ENCOUNTER — Encounter (HOSPITAL_BASED_OUTPATIENT_CLINIC_OR_DEPARTMENT_OTHER): Payer: Self-pay | Admitting: Physical Therapy

## 2022-06-18 DIAGNOSIS — M62838 Other muscle spasm: Secondary | ICD-10-CM | POA: Insufficient documentation

## 2022-06-18 DIAGNOSIS — M542 Cervicalgia: Secondary | ICD-10-CM

## 2022-06-18 NOTE — Therapy (Signed)
OUTPATIENT PHYSICAL THERAPY CERVICAL Treatment    Patient Name: Alyssa Bennett MRN: [id number] DOB:[birth year], 22 y.o., female Today's Date: 03/04/2022   PT End of Session - 06/18/22 1054     Visit Number 13    Number of Visits 20    Date for PT Re-Evaluation 07/07/22    PT Start Time 1015    PT Stop Time 1057    PT Time Calculation (min) 42 min    Activity Tolerance Patient tolerated treatment well    Behavior During Therapy [facility] for tasks assessed/performed                      Past Medical History:  Diagnosis Date   ADHD (attention deficit hyperactivity disorder)    Atypical chest pain 12/25/2021   Migraine    Past Surgical History:  Procedure Laterality Date   TONSILLECTOMY     Patient Active Problem List   Diagnosis Date Noted   Dizziness, nonspecific 02/20/2022   Tinea 02/17/2022   Pre-diabetes 02/10/2022   Vaginal odor 02/02/2022   Acute pain of left knee 02/02/2022   Acute right ankle pain 02/02/2022   Hand pain, right 02/02/2022   Bulimia nervosa 12/31/2021   PTSD (post-traumatic stress disorder) 12/31/2021   Atypical chest pain 12/25/2021   Palpitations 12/04/2021   Stabbing headache 11/09/2021   Gastroesophageal reflux disease 11/09/2021   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   Tourette's syndrome 01/26/2019   Generalized anxiety disorder 05/25/2018   Irregular periods 04/01/2016   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   SUBJECTIVE:                                                                                                                                                                                                           SUBJECTIVE STATEMENT: The patient reports things have been going pretty good. She has some tension in her shoulders and her jaw has been popping some.     PERTINENT HISTORY:  Anxiety, ADHD   PAIN:  Are you having pain? Yes: NPRS scale: 4-5/10 Pain location: neck  Pain description: aching  Aggravating factors: sleeping on stomach Relieving factors: Migraine relief strategies - dark room, gentle pressure over temporalis   PRECAUTIONS: None   WEIGHT BEARING RESTRICTIONS No   FALLS:  Has patient fallen in last 6 months? No   LIVING ENVIRONMENT: Lives with: lives alone Lives in: House/apartment   OCCUPATION: Retail   PLOF: Independent   PATIENT GOALS   Reduce frequency/severity of headaches, improve neck range of motion   OBJECTIVE:    DIAGNOSTIC FINDINGS:  No recent imaging.  POSTURE: rounded shoulders and forward head   PALPATION: Tender to palpation of generalized R cervical musculature. Concordant pain with trigger points at C3/4 in UT and in temporalis 12/19   CERVICAL ROM:     Active ROM AROM (deg) eval AROM 8/4 AROM  12/19  Flexion WFL Full  Full  Full   Extension [facility] Full  Full  Full   Right lateral flexion Full, pain on R/stiff  30    Left lateral flexion Full, pain on R 36    Right rotation [facility]  Full  Full   Left rotation WFL      (Blank rows = not tested)   8/4: Can feel the stretch both ways but more to the Left  UPPER EXTREMITY ROM:   Full active motion of the shoulders      Active ROM Right eval Left eval  Shoulder flexion [facility]  Shoulder extension      Shoulder abduction [facility]  Shoulder adduction      Shoulder extension      Shoulder internal rotation      Shoulder external rotation [facility] WFL  Middle trapezius      Lower trapezius      Elbow flexion      Elbow extension      Wrist flexion       Wrist extension      Wrist ulnar deviation      Wrist radial deviation      Wrist pronation      Wrist supination      Grip strength       (Blank rows = not tested)   UPPER EXTREMITY MMT:  MMT Right eval Left eval  Shoulder flexion 4 4  Shoulder extension    Shoulder abduction    Shoulder adduction    Shoulder extension    Shoulder internal rotation 4 4  Shoulder external rotation 4 4  Middle trapezius    Lower trapezius    Elbow flexion    Elbow extension    Wrist flexion    Wrist extension    Wrist ulnar deviation    Wrist radial deviation    Wrist pronation    Wrist supination    Grip strength     (Blank rows = not tested)     TODAY'S TREATMENT:  1/26 Trigger Point Dry Needling, Manual Therapy Treatment:  Initial or subsequent education regarding Trigger Point Dry Needling: Subsequent Did patient give consent to treatment with Trigger Point Dry Needling: Yes TPDN of bilateral UT 2 spots .30x50 needle sub-occipitals 2 spots  Response: great twitch response    Bilateral er 2x10 red Bilateral horizontal abduction 2x10 red  Pallof press x20 red     12/19 Trigger Point Dry Needling, Manual Therapy Treatment:  Initial or subsequent education regarding Trigger Point Dry Needling: Subsequent Did patient give consent to treatment with Trigger Point Dry Needling: Yes TPDN of bilateral UT 2 spots .30x50 needle sub-occipitals 2 spots  Response: great twitch response   Bilateral er 2x10 red Bilateral horizontal abduction 2x10 red  Re-assessment: reviewed all goals and progress since last visit.     PATIENT EDUCATION:  Education details: HEP, symptom management, benefits of PT, benefits and risks of TPDN,  Person educated: Patient Education method: Explanation, Demonstration, Tactile cues, Verbal cues, and Handouts Education comprehension: verbalized understanding     HOME EXERCISE PROGRAM: Access Code: [id number]     ASSESSMENT:   CLINICAL  IMPRESSION: The patient has some tightness in her upper traps today. She had a good response to needling. We continue to work on exercises for core and posture. She was encouraged to continue at home. She also had some popping in her jaw. We needled her masseter and she had a significant improvement. We will continue to progress as tolerated.    OBJECTIVE IMPAIRMENTS decreased ROM, decreased strength, increased muscle spasms, and pain.    ACTIVITY LIMITATIONS  when pain comes on it can effect whatever activity she is doing    PARTICIPATION LIMITATIONS:  any activity she perfoming at the time   PERSONAL FACTORS 1-2 comorbidities: ADHD, history of migraines   are also affecting patient's functional outcome.    REHAB POTENTIAL: Good   CLINICAL DECISION MAKING: Evolving/moderate complexity increasing frequency of headaches that effect her ability to perform ADL'S   EVALUATION COMPLEXITY: Moderate     GOALS: Goals reviewed with patient? Yes   SHORT TERM GOALS: Target date:11/2   Patient will improve cervical AROM to full and pain-free. Baseline:  Goal status: mild pain at end range 12/20   2.  Patient will improve generalized shoulder  strength to 5/5 bil. Baseline:  Goal status:4/5 today decreased from last re-assessment 12/20    3.  Patient will report having no cervical pain at rest. Baseline:  Goal status: archived    LONG TERM GOALS: Target date:11/23   Patient will report a decrease in weekly headache frequency to 1 or less a week  Baseline:  Goal status: back to having headaches    2.  Patient will report a decrease in headache severity to 5/10 NPRS to indicate MCID. Baseline:  Goal status: headaches have retruned 12/20    3.  Patient will be independent with gym/exercise program to maintain and improve current level of fitness. Baseline:  Goal status: will progress towards gym activity        PLAN: PT FREQUENCY: 1-2x/week   PT DURATION: 6 weeks   PLANNED  INTERVENTIONS: Therapeutic exercises, Therapeutic activity, Neuromuscular re-education, Balance training, Gait training, Patient/Family education, Self Care, Joint mobilization, Dry Needling, Cryotherapy, and Moist heat   PLAN FOR NEXT SESSION: Dry needling and soft tissue for TrP, PRN. Cervical strength/stability exercises, postural stability. Continue with gym exercises  *next visit is last approved*    Check all possible CPT codes: 97164 - PT Re-evaluation, 97110- Therapeutic Exercise, 97140 - Manual Therapy, 97530 - Therapeutic Activities, 97535 - Self Care, 97014 - Electrical stimulation (unattended), [id number] - Iontophoresis, [id number] - Ultrasound, and [id number] - Aquatic therapy    Check all conditions that are expected to impact treatment: none    If treatment provided at initial evaluation, no treatment charged due to lack of authorization.       Carolyne Littles PT DPT  06/18/22 1:09 PM

## 2022-06-22 ENCOUNTER — Ambulatory Visit (HOSPITAL_BASED_OUTPATIENT_CLINIC_OR_DEPARTMENT_OTHER): Payer: Medicaid Other | Admitting: Physical Therapy

## 2022-06-22 ENCOUNTER — Encounter (HOSPITAL_BASED_OUTPATIENT_CLINIC_OR_DEPARTMENT_OTHER): Payer: Self-pay

## 2022-06-28 NOTE — Progress Notes (Unsigned)
   CC:  headaches  Follow-up Visit  Last visit: 11/27/21  Brief HPI: 22 year old female with a history of GERD, ADD, anxiety, migraines who follows in clinic for right sided occipital neuralgia.  At her last visit she was referred to neck PT and started on Cymbalta. Interval History: ***   Headache days per month: *** Migraine days per month*** Headache free days per month: ***  Current Headache Regimen: Preventative: *** Abortive: ***   Prior Therapies                                  Maxalt 10 mg PRN Cymbalta 20 mg daily  Physical Exam:   Vital Signs: There were no vitals taken for this visit. GENERAL:  well appearing, in no acute distress, alert  SKIN:  Color, texture, turgor normal. No rashes or lesions HEAD:  Normocephalic/atraumatic. RESP: normal respiratory effort MSK:  No gross joint deformities.   NEUROLOGICAL: Mental Status: Alert, oriented to person, place and time, Follows commands, and Speech fluent and appropriate. Cranial Nerves: PERRL, face symmetric, no dysarthria, hearing grossly intact Motor: moves all extremities equally Gait: normal-based.  IMPRESSION: ***  PLAN: ***   Follow-up: ***  I spent a total of *** minutes on the date of the service. Headache education was done. Discussed lifestyle modification including increased oral hydration, decreased caffeine, exercise and stress management. Discussed treatment options including preventive and acute medications, natural supplements, and infusion therapy. Discussed medication overuse headache and to limit use of acute treatments to no more than 2 days/week or 10 days/month. Discussed medication side effects, adverse reactions and drug interactions. Written educational materials and patient instructions outlining all of the above were given.  Genia Harold, MD

## 2022-06-29 ENCOUNTER — Encounter (HOSPITAL_BASED_OUTPATIENT_CLINIC_OR_DEPARTMENT_OTHER): Payer: Self-pay | Admitting: Physical Therapy

## 2022-06-29 ENCOUNTER — Ambulatory Visit: Payer: Medicaid Other | Admitting: Psychiatry

## 2022-06-29 ENCOUNTER — Ambulatory Visit (HOSPITAL_BASED_OUTPATIENT_CLINIC_OR_DEPARTMENT_OTHER): Payer: Medicaid Other | Attending: Nurse Practitioner | Admitting: Physical Therapy

## 2022-06-29 ENCOUNTER — Encounter: Payer: Self-pay | Admitting: Psychiatry

## 2022-06-29 VITALS — BP 132/82 | HR 95 | Ht 60.0 in | Wt 193.0 lb

## 2022-06-29 DIAGNOSIS — M542 Cervicalgia: Secondary | ICD-10-CM | POA: Insufficient documentation

## 2022-06-29 DIAGNOSIS — M5481 Occipital neuralgia: Secondary | ICD-10-CM

## 2022-06-29 DIAGNOSIS — M62838 Other muscle spasm: Secondary | ICD-10-CM | POA: Insufficient documentation

## 2022-06-29 MED ORDER — DULOXETINE HCL 30 MG PO CPEP: 30.0000 mg | ORAL_CAPSULE | Freq: Every day | ORAL | 6 refills | Status: DC

## 2022-06-29 NOTE — Therapy (Signed)
OUTPATIENT PHYSICAL THERAPY CERVICAL Treatment    Patient Name: Alyssa Bennett MRN: [id number] DOB:04-01-2001, 22 y.o., female Today's Date: 03/04/2022   PT End of Session - 06/29/22 1716     Visit Number 14    Number of Visits 20    Date for PT Re-Evaluation 07/07/22    PT Start Time 1645    PT Stop Time 1725    PT Time Calculation (min) 40 min    Activity Tolerance Patient tolerated treatment well    Behavior During Therapy [facility] for tasks assessed/performed                 Past Medical History:  Diagnosis Date   ADHD (attention deficit hyperactivity disorder)    Atypical chest pain 12/25/2021   Migraine    Past Surgical History:  Procedure Laterality Date   TONSILLECTOMY     Patient Active Problem List   Diagnosis Date Noted   Dizziness, nonspecific 02/20/2022   Tinea 02/17/2022   Pre-diabetes 02/10/2022   Vaginal odor 02/02/2022   Acute pain of left knee 02/02/2022   Acute right ankle pain 02/02/2022   Hand pain, right 02/02/2022   Bulimia nervosa 12/31/2021   PTSD (post-traumatic stress disorder) 12/31/2021   Atypical chest pain 12/25/2021   Palpitations 12/04/2021   Stabbing headache 11/09/2021   Gastroesophageal reflux disease 11/09/2021   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   Tourette's syndrome 01/26/2019   Generalized anxiety disorder 05/25/2018   Irregular periods 04/01/2016   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   SUBJECTIVE:                                                                                                                                                                                                           SUBJECTIVE STATEMENT: Pt states she had med dosage changes from MD and to continue with PT.  Pt has had decrease in jaw pain but tightening again today.     PERTINENT HISTORY:  Anxiety, ADHD   PAIN:  Are you having pain? No: NPRS scale: 0/10 Pain location: neck Pain description:  aching  Aggravating factors: sleeping on stomach Relieving factors: Migraine relief strategies - dark room, gentle pressure over temporalis   PRECAUTIONS: None   WEIGHT BEARING RESTRICTIONS No   FALLS:  Has patient fallen in last 6 months? No   LIVING ENVIRONMENT: Lives with: lives alone Lives in: House/apartment   OCCUPATION: Retail   PLOF: Independent   PATIENT GOALS   Reduce frequency/severity of headaches, improve neck range of motion   OBJECTIVE:    DIAGNOSTIC FINDINGS:  No recent imaging.  POSTURE: rounded shoulders and forward head   PALPATION: Tender to palpation of generalized R cervical musculature. Concordant pain with trigger points at C3/4 in UT and in temporalis 12/19   CERVICAL ROM:     Active ROM AROM (deg) eval AROM 8/4 AROM  12/19  Flexion WFL Full  Full  Full   [facility] Full  Full  Full   Right lateral flexion Full, pain on R/stiff  30    Left lateral flexion Full, pain on R 36    Right rotation [facility]  Full  Full   Left rotation WFL      (Blank rows = not tested)   8/4: Can feel the stretch both ways but more to the Left  UPPER EXTREMITY ROM:   Full active motion of the shoulders      Active ROM Right eval Left eval  Shoulder flexion [facility]  Shoulder extension      Shoulder abduction [facility]  Shoulder adduction      Shoulder extension      Shoulder internal rotation      Shoulder external rotation [facility] WFL  Middle trapezius      Lower trapezius      Elbow flexion      Elbow extension      Wrist flexion      Wrist  extension      Wrist ulnar deviation      Wrist radial deviation      Wrist pronation      Wrist supination      Grip strength       (Blank rows = not tested)   UPPER EXTREMITY MMT:  MMT Right eval Left eval  Shoulder flexion 4 4  Shoulder extension    Shoulder abduction    Shoulder adduction    Shoulder extension    Shoulder internal rotation 4 4  Shoulder external rotation 4 4  Middle trapezius    Lower trapezius    Elbow flexion    Elbow extension    Wrist flexion    Wrist extension    Wrist ulnar deviation    Wrist radial deviation    Wrist pronation    Wrist supination    Grip strength     (Blank rows = not tested)     TODAY'S TREATMENT:   2/6  Trigger Point Dry Needling, Manual Therapy Treatment:  Initial or subsequent education regarding Trigger Point Dry Needling: Subsequent Did patient give consent to treatment with Trigger Point Dry Needling: Yes TPDN of bilateral  Response: twitch response elicited, increase in soft tissue length STM bilat UT, C/S paraspinals, suboccipitals Manual cervical traction 5 min hold   YTB paloff 2x10  YTB extension 2x10  YTB stars 2x5 YTB ER 2x10      PATIENT EDUCATION:  Education details: HEP, symptom management, benefits of PT, benefits and risks of TPDN,  Person educated: Patient Education method: Explanation, Demonstration, Tactile cues, Verbal cues, and Handouts Education comprehension: verbalized  understanding     HOME EXERCISE PROGRAM: Access Code: [email or web address]     ASSESSMENT:   CLINICAL IMPRESSION: Pt with good response to TPDN with improvement in soft tissue tension and report of improvement in tightness along bilat C/S. Pt was able to progress volume of loading today for the upper quarter with intensity reduced in order to reduce irritation. Pt questions answered regarding abdominal bracing vs vacuum position and how that effect functional lifting tasks. No irritation with exercise at today's session. Plan  to continue with UE, postural strength, and pain management as tolerated. Pt would benefit from continued skilled therapy in order to reach goals and maximize functional upper quarter strength and tolerance for performance of ADL, occupation, and self care tasks.     OBJECTIVE IMPAIRMENTS decreased ROM, decreased strength, increased muscle spasms, and pain.    ACTIVITY LIMITATIONS  when pain comes on it can effect whatever activity she is doing    PARTICIPATION LIMITATIONS:  any activity she perfoming at the time   PERSONAL FACTORS 1-2 comorbidities: ADHD, history of migraines   are also affecting patient's functional outcome.    REHAB POTENTIAL: Good   CLINICAL DECISION MAKING: Evolving/moderate complexity increasing frequency of headaches that effect her ability to perform ADL'S   EVALUATION COMPLEXITY: Moderate     GOALS: Goals reviewed with patient? Yes   SHORT TERM GOALS: Target date:11/2   Patient will improve cervical AROM to full and pain-free. Baseline:  Goal status: mild pain at end range 12/20   2.  Patient will improve generalized shoulder  strength to 5/5 bil. Baseline:  Goal status:4/5 today decreased from last re-assessment 12/20    3.  Patient will report having no cervical pain at rest. Baseline:  Goal status: archived    LONG TERM GOALS: Target date:11/23   Patient will report a decrease in weekly headache frequency to 1 or less a week  Baseline:  Goal status: back to having headaches    2.  Patient will report a decrease in headache severity to 5/10 NPRS to indicate MCID. Baseline:  Goal status: headaches have retruned 12/20    3.  Patient will be independent with gym/exercise program to maintain and improve current level of fitness. Baseline:  Goal status: will progress towards gym activity        PLAN: PT FREQUENCY: 1-2x/week   PT DURATION: 6 weeks   PLANNED INTERVENTIONS: Therapeutic exercises, Therapeutic activity, Neuromuscular  re-education, Balance training, Gait training, Patient/Family education, Self Care, Joint mobilization, Dry Needling, Cryotherapy, and Moist heat   PLAN FOR NEXT SESSION: Dry needling and soft tissue for TrP, PRN. Cervical strength/stability exercises, postural stability. Continue with gym exercises  *next visit is last approved*    Check all possible CPT codes: 97164 - PT Re-evaluation, 97110- Therapeutic Exercise, 97140 - Manual Therapy, 97530 - Therapeutic Activities, 97535 - Self Care, 97014 - Electrical stimulation (unattended), [id number] - Iontophoresis, [id number] - Ultrasound, and [id number] - Aquatic therapy    Check all conditions that are expected to impact treatment: none    If treatment provided at initial evaluation, no treatment charged due to lack of authorization.      Daleen Bo PT, DPT 06/29/22 5:34 PM

## 2022-07-05 NOTE — Progress Notes (Unsigned)
Procedure: Occipital Nerve injection/Trigger point injection  Location: bilateral occiput  The risks, benefits and anticipated outcomes of the procedure, the risks and benefits of the alternatives to the procedure, and the roles and tasks of the personnel to be involved, were discussed with the patient, and the patient consents to the procedure and agrees to proceed.     A combination of 1 cc betamethasone 6 mg and 4 cc of 0.25% bupivacaine were prepared in 1 syringes (5 cc).  2 trigger points on the splenius capitus was identified and injected. The left and right greater occipital nerve was injected 3cm caudal and 1.5 cm lateral to the inion where the main trunk of the occipital nerve penetrates the semispinalis muscle.  The needle was placed perpendicular and the needle advanced 1.5 cm. After aspiration to ensure no obstruction or presence of blood, the area was injected.  The needle was repositioned in a fan-like manner and the entire area was injected. Pressure was held and no hematoma was noted.   Genia Harold, MD 07/06/22 3:44 PM

## 2022-07-06 ENCOUNTER — Ambulatory Visit (INDEPENDENT_AMBULATORY_CARE_PROVIDER_SITE_OTHER): Payer: Medicaid Other | Admitting: Psychiatry

## 2022-07-06 VITALS — BP 123/75 | HR 85 | Ht 60.0 in | Wt 191.5 lb

## 2022-07-06 DIAGNOSIS — M542 Cervicalgia: Secondary | ICD-10-CM | POA: Diagnosis not present

## 2022-07-06 DIAGNOSIS — M5481 Occipital neuralgia: Secondary | ICD-10-CM

## 2022-07-08 ENCOUNTER — Ambulatory Visit (HOSPITAL_COMMUNITY): Payer: Medicaid Other | Admitting: Psychiatry

## 2022-07-29 ENCOUNTER — Other Ambulatory Visit: Payer: Self-pay | Admitting: Psychiatry

## 2022-08-06 ENCOUNTER — Ambulatory Visit (HOSPITAL_BASED_OUTPATIENT_CLINIC_OR_DEPARTMENT_OTHER): Payer: Medicaid Other | Admitting: Orthopaedic Surgery

## 2022-08-06 DIAGNOSIS — M2550 Pain in unspecified joint: Secondary | ICD-10-CM | POA: Diagnosis not present

## 2022-08-06 NOTE — Progress Notes (Signed)
Chief Complaint: Right arm pain, left knee pain, right ankle pain     History of Present Illness:   08/06/2022: Presents today for follow-up on persistent left knee instability as well as lower back pain.  The knee is persistently giving out.  She states that the back feels like it needs to pop.  Alyssa Bennett is a 22 y.o. female presents today for pain in the left knee that has been going on now for 7 months.  She feels like it is probably getting out.  There is worse pain when she is increasing her walking speed.  With leg curls there is popping of the patella deep at the base of the knee.  With regard to the right ankle she does have a history of ankle popping and clicking.  She has been overcompensating on the side due to the knee.  She is also experiencing pain radiating down the upper back into the right thumb.  She has been undergoing dry needling on this side with some relief although this is overall progressing.    Surgical History:   None  PMH/PSH/Family History/Social History/Meds/Allergies:    Past Medical History:  Diagnosis Date   ADHD (attention deficit hyperactivity disorder)    Atypical chest pain 12/25/2021   Migraine    Past Surgical History:  Procedure Laterality Date   TONSILLECTOMY     Social History   Socioeconomic History   Marital status: Single    Spouse name: Not on file   Number of children: 0   Years of education: Not on file   Highest education level: Some college, no degree  Occupational History   Not on file  Tobacco Use   Smoking status: Never   Smokeless tobacco: Never  Substance and Sexual Activity   Alcohol use: No   Drug use: No   Sexual activity: Never  Other Topics Concern   Not on file  Social History Narrative   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 She enjoys music, science and hanging with friends and family   Caffeine-  average 2 a day, used to drink more caffeine   Social Determinants of Health   Financial Resource Strain: Low Risk  (12/25/2021)   Overall Financial Resource Strain (CARDIA)    Difficulty of Paying Living Expenses: Not hard at all  Food Insecurity: Food Insecurity Present (02/10/2022)   Hunger Vital Sign    Worried About Running Out of Food in the Last Year: Sometimes true    Ran Out of Food in the Last Year: Sometimes true  Transportation Needs: No Transportation Needs (12/25/2021)   PRAPARE - Hydrologist (Medical): No    Lack of Transportation (Non-Medical): No  Physical Activity: Sufficiently Active (12/31/2021)   Exercise Vital Sign    Days of Exercise per Week: 6 days    Minutes of Exercise per Session: 60 min  Recent Concern: Physical Activity - Inactive (12/25/2021)   Exercise Vital Sign    Days of Exercise per Week: 0 days    Minutes of Exercise per Session: 0 min  Stress: Stress Concern Present (12/31/2021)   [place]    Feeling of Stress : Very  much  Social Connections: Moderately Isolated (12/31/2021)   Social Connection and Isolation Panel [NHANES]    Frequency of Communication with Friends and Family: More than three times a week    Frequency of Social Gatherings with Friends and Family: Once a week    Attends Religious Services: Never    Marine scientist or Organizations: No    Attends Music therapist: Never    Marital Status: Living with partner   Family History  Problem Relation Age of Onset   Migraines Mother    ADD / ADHD Mother    Anxiety disorder Mother    Heart attack Father 39   Bradycardia Father    ADD / ADHD Brother    Headache Brother    Migraines Maternal Grandmother    Anxiety disorder Maternal Grandmother    Heart attack Paternal Grandmother    Seizures Neg Hx    Autism Neg Hx    Depression Neg Hx    Bipolar disorder Neg Hx     Schizophrenia Neg Hx    Allergies  Allergen Reactions   Zithromax [Azithromycin] Itching and Rash   Current Outpatient Medications  Medication Sig Dispense Refill   clotrimazole (ANTIFUNGAL, CLOTRIMAZOLE,) 1 % cream Apply 1 Application topically 2 (two) times daily. For athletes foot. 85 g 6   DULoxetine (CYMBALTA) 30 MG capsule TAKE 1 CAPSULE BY MOUTH EVERY DAY 90 capsule 3   esomeprazole (NEXIUM) 40 MG capsule One tab by mouth at dinner time. 90 capsule 3   glucose blood (ACCU-CHEK GUIDE) test strip To check blood sugar at least twice a day and additionally as needed for signs of low or high blood sugar. 100 each 99   levocetirizine (XYZAL) 5 MG tablet TAKE 1 TABLET BY MOUTH EVERY DAY IN THE EVENING 90 tablet 4   methylphenidate (RITALIN) 10 MG tablet Take 1 tablet (10 mg total) by mouth daily in the afternoon. 30 tablet 0   methylPREDNISolone (MEDROL DOSEPAK) 4 MG TBPK tablet Take per packet instructions 1 each 0   norethindrone-ethinyl estradiol-FE (LOESTRIN FE 1/20) 1-20 MG-MCG tablet Take 1 tablet by mouth daily. 84 tablet 3   ondansetron (ZOFRAN) 8 MG tablet Take 1 tablet (8 mg total) by mouth every 8 (eight) hours as needed for nausea or vomiting. 20 tablet 2   rizatriptan (MAXALT) 10 MG tablet Take 1 tablet (10 mg total) by mouth as needed for migraine. May repeat in 2 hours if needed 12 tablet 3   Vitamin D, Ergocalciferol, (DRISDOL) 1.25 MG (50000 UNIT) CAPS capsule TAKE 1 CAPSULE EVERY 7 (SEVEN) DAYS. TAKE FOR 12 TOTAL DOSES THAN CAN TRANSITION TO 1000IU OTC DAILY 12 capsule 0   Current Facility-Administered Medications  Medication Dose Route Frequency Provider Last Rate Last Admin   cyanocobalamin (VITAMIN B12) injection 1,000 mcg  1,000 mcg Intramuscular Q30 days Early, Coralee Pesa, NP   1,000 mcg at 02/12/22 1140   No results found.  Review of Systems:   A ROS was performed including pertinent positives and negatives as documented in the HPI.  Physical Exam :   Constitutional:  NAD and appears stated age Neurological: Alert and oriented Psych: Appropriate affect and cooperative There were no vitals taken for this visit.   Comprehensive Musculoskeletal Exam:    There is tenderness palpation about the infrapatellar fat pad of the left knee.  Range of motion is from -3-135.  No joint line tenderness.  Negative patella apprehension.  Negative patellar grind.  No joint  line tenderness.  Negative Lachman.  Back with paraspinal muscular tenderness without any tract central pain.  There are subluxating peroneal tendons on the right as well as the left.  There is some tenderness around the joint of the right ankle.  Range of motion is to 20 degrees of dorsiflexion to 35 degrees of plantarflexion.  There is some pain with inversion and eversion.  Neurosensory exam within the right upper extremity is intact with endorsed dysesthesias in the right thumb   Imaging:   Xray (4 views left knee, 4 views right hand, 3 views right ankle): Normal    I personally reviewed and interpreted the radiographs.   Assessment:   22 y.o. female with left knee pain consistent more with patellofemoral pain versus mixed connective tissue pain.  Either way I described that I would treat these similarly with a strong hip and knee strengthening program.  At this time we will plan for referral to therapy to also include this component for the left knee in addition to working on her back.  -Return to clinic as needed       I personally saw and evaluated the patient, and participated in the management and treatment plan.  Vanetta Mulders, MD Attending Physician, Orthopedic Surgery  This document was dictated using Dragon voice recognition software. A reasonable attempt at proof reading has been made to minimize errors.

## 2022-08-12 ENCOUNTER — Encounter: Payer: Self-pay | Admitting: Nurse Practitioner

## 2022-08-13 ENCOUNTER — Ambulatory Visit: Payer: Medicaid Other | Admitting: Nurse Practitioner

## 2022-08-13 NOTE — Progress Notes (Deleted)
  Orma Render, DNP, AGNP-c [place], [place] [address] [phone]  Subjective:   Alyssa Bennett is a 22 y.o. female presents to day for evaluation of:    PMH, Medications, and Allergies reviewed and updated in chart as appropriate.   ROS negative except for what is listed in HPI. Objective:  There were no vitals taken for this visit. Physical Exam        Assessment & Plan:   Problem List Items Addressed This Visit   None     Orma Render, DNP, AGNP-c 08/13/2022  8:07 AM    History, Medications, Surgery, SDOH, and Family History reviewed and updated as appropriate.

## 2022-08-19 ENCOUNTER — Encounter: Payer: Self-pay | Admitting: Nurse Practitioner

## 2022-09-03 ENCOUNTER — Encounter: Payer: Self-pay | Admitting: Nurse Practitioner

## 2022-09-06 ENCOUNTER — Other Ambulatory Visit: Payer: Self-pay

## 2022-09-06 DIAGNOSIS — Z1379 Encounter for other screening for genetic and chromosomal anomalies: Secondary | ICD-10-CM

## 2022-09-07 ENCOUNTER — Ambulatory Visit (HOSPITAL_BASED_OUTPATIENT_CLINIC_OR_DEPARTMENT_OTHER): Payer: Medicaid Other | Attending: Nurse Practitioner | Admitting: Physical Therapy

## 2022-09-07 ENCOUNTER — Other Ambulatory Visit: Payer: Self-pay

## 2022-09-07 ENCOUNTER — Encounter (HOSPITAL_BASED_OUTPATIENT_CLINIC_OR_DEPARTMENT_OTHER): Payer: Self-pay | Admitting: Physical Therapy

## 2022-09-07 DIAGNOSIS — M2550 Pain in unspecified joint: Secondary | ICD-10-CM | POA: Insufficient documentation

## 2022-09-07 DIAGNOSIS — M25562 Pain in left knee: Secondary | ICD-10-CM | POA: Diagnosis present

## 2022-09-07 DIAGNOSIS — G8929 Other chronic pain: Secondary | ICD-10-CM | POA: Diagnosis present

## 2022-09-07 DIAGNOSIS — R2689 Other abnormalities of gait and mobility: Secondary | ICD-10-CM | POA: Diagnosis present

## 2022-09-07 DIAGNOSIS — M6281 Muscle weakness (generalized): Secondary | ICD-10-CM | POA: Diagnosis present

## 2022-09-07 NOTE — Therapy (Signed)
OUTPATIENT PHYSICAL THERAPY LOWER EXTREMITY EVALUATION   Patient Name: Alyssa Bennett MRN: [id number] DOB:08/14/2000, 22 y.o., female Today's Date: 09/07/2022  END OF SESSION:  PT End of Session - 09/07/22 1142     Visit Number 1    Number of Visits 12    Date for PT Re-Evaluation 10/19/22    PT Start Time 1100    PT Stop Time 1135   patient did not require full time   PT Time Calculation (min) 35 min    Activity Tolerance Patient tolerated treatment well    Behavior During Therapy [facility] for tasks assessed/performed             Past Medical History:  Diagnosis Date   ADHD (attention deficit hyperactivity disorder)    Atypical chest pain 12/25/2021   Migraine    Past Surgical History:  Procedure Laterality Date   TONSILLECTOMY     Patient Active Problem List   Diagnosis Date Noted   Pre-diabetes 02/10/2022   Bulimia nervosa 12/31/2021   PTSD (post-traumatic stress disorder) 12/31/2021   Atypical chest pain 12/25/2021   Palpitations 12/04/2021   Stabbing headache 11/09/2021   Gastroesophageal reflux disease 11/09/2021   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   Tourette's syndrome 01/26/2019   Generalized anxiety disorder 05/25/2018   Irregular periods 04/01/2016   Dermatitis 07/09/2015   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   Classic migraine 07/08/2015   Tachycardia 07/08/2015   Chronic constipation 09/13/2012   Allergic rhinitis 08/04/2009    PCP: Enid Skeens, MD  REFERRING PROVIDER: Huel Cote, MD  REFERRING DIAG: Huel Cote, MD  THERAPY DIAG:  M25.50 (ICD-10-CM) - Pain in joint involving multiple sites   Rationale for Evaluation and Treatment: Rehabilitation  ONSET DATE: a few years ago  SUBJECTIVE:   SUBJECTIVE STATEMENT: Patient is a 22 y/o female who presents with left knee pain for a couple of years but has noticed it has been worse over the past couple of months. Patient also reports that extending the knee makes the knee  pop and click. Patient states that she has noticed some bruising bilaterally on the inside of her knees within the last couple of weeks. Denies any falls or kneeling. Patient reports that she recently been having right knee pian as well but the left knee is more severe. Patient reports that the her Right medial knee feels sharp and her Lateral right knee is dull. Patient reports that her right knee and hip has started to hurt as well. Patient states that she had to use a cane a few times to help her walk.  PERTINENT HISTORY: Headaches and migraines, ADHD,  PAIN:  Are you having pain? Yes: NPRS scale: 4/10 Pain location: Left knee, right hip  Pain description: sharp, dull, aching Aggravating factors: standing, walking, Relieving factors: rest, ice helps sometimes   PRECAUTIONS: None  WEIGHT BEARING RESTRICTIONS: No  FALLS:  Has patient fallen in last 6 months? No  LIVING ENVIRONMENT: Lives with: lives with their family Lives in: House/apartment Stairs: No Has following equipment at home: None  OCCUPATION: [employer]   PLOF: Independent  PATIENT GOALS: hiking  NEXT MD VISIT: none  OBJECTIVE:   DIAGNOSTIC FINDINGS:   PATIENT SURVEYS:    COGNITION: Overall cognitive status: Within functional limits for tasks assessed     SENSATION: WFL  EDEMA:    MUSCLE LENGTH:   POSTURE: rounded shoulders  PALPATION: TTP, right medial knee, Left medial knee  LOWER EXTREMITY ROM:  Passive ROM Right  eval Left eval  Hip flexion [facility]  Hip extension    Hip abduction    Hip adduction    Hip internal rotation WFL pain at end range  [facility]  Hip external rotation [facility]  Knee flexion    Knee extension    Ankle dorsiflexion    Ankle plantarflexion    Ankle inversion    Ankle eversion     (Blank rows = not tested)  LOWER EXTREMITY MMT:  MMT Right eval Left eval  Hip flexion 18.5 18.7  Hip extension    Hip abduction 23.1 19.6  Hip adduction    Hip internal  rotation    Hip external rotation    Knee flexion    Knee extension 23.5 23.7  Ankle dorsiflexion    Ankle plantarflexion    Ankle inversion    Ankle eversion     (Blank rows = not tested)  LOWER EXTREMITY SPECIAL TESTS:    FUNCTIONAL TESTS:    GAIT: Patient reports the use of a cane from time to time.    TODAY'S TREATMENT:                                                                                                                              DATE:  4/16 Supine Bridge x10  Seated Hip abduction yellow band x10 SLR bilateral x5 each   PATIENT EDUCATION:  Education details: HEP, POC Person educated: Patient Education method: Explanation, Demonstration, Tactile cues, Verbal cues, and Handouts Education comprehension: verbalized understanding, returned demonstration, verbal cues required, tactile cues required, and needs further education  HOME EXERCISE PROGRAM: Access Code: [email or web address] URL: [email or web address] Date: 09/07/2022 Prepared by: Lorayne Bender  Exercises - Supine Bridge  - 1 x daily - 7 x weekly - 3 sets - 10 reps - Small Range Straight Leg Raise  - 1 x daily - 7 x weekly - 3 sets - 10 reps - Hooklying Clamshell with Resistance  - 1 x daily - 7 x weekly - 3 sets - 10 reps  ASSESSMENT:  CLINICAL IMPRESSION: Patient is a 22 y.o. female who was seen today for physical therapy evaluation and treatment for left knee pain. Patient Left LE PROM is [facility] but has pain at end range of Knee IR. Patient has fairly equal strength bilaterally but is not stable or strong. Patient is TTP medial of both knees. Patient will benefit from skilled physical therapy to increase strength and decrease pain at end range to get back to functional mobility and ADLs without discomfort.    OBJECTIVE IMPAIRMENTS: decreased activity tolerance, decreased endurance, and decreased strength.   ACTIVITY LIMITATIONS: lifting, bending, standing, squatting, and stairs  PARTICIPATION  LIMITATIONS: meal prep, cleaning, laundry, driving, shopping, community activity, and occupation  PERSONAL FACTORS: 1-2 comorbidities: Headaches, migraines, ADHD  are also affecting patient's functional outcome.   REHAB POTENTIAL: Excellent  CLINICAL DECISION MAKING: Evolving/moderate complexity  EVALUATION COMPLEXITY: Moderate   GOALS: Goals reviewed  with patient? Yes  SHORT TERM GOALS: Target date: 5/14 Patient will increase LE bilaterally by 5 lbs.  Baseline: Goal status: INITIAL  2.  Patient will be independent of HEP.  Baseline:  Goal status: INITIAL  3.  Patient will not have pain at end range of Hip IR. Baseline:  Goal status: INITIAL   LONG TERM GOALS: Target date: 5/28  Patient will be able to stand for greater than 30 minutes without significant increase of pain. Baseline:  Goal status: INITIAL  2.  Patient will be able to return to hiking without pain or discomfort. Baseline:  Goal status: INITIAL  3.  Patient will be able to ambulate community distances without any pain.  Baseline:  Goal status: INITIAL      PLAN:  PT FREQUENCY: 1-2x/week  PT DURATION: 6 weeks  PLANNED INTERVENTIONS: Therapeutic exercises, Therapeutic activity, Neuromuscular re-education, Balance training, Gait training, Patient/Family education, Self Care, Joint mobilization, Dry Needling, Electrical stimulation, Cryotherapy, Moist heat, Taping, Ultrasound, and Manual therapy  PLAN FOR NEXT SESSION: Consider upright bike, SAQ, minisquats. Strengthening of LU bilaterally and progress to stair training. TPDN for lower back and legs PRN     During this treatment session, the therapist was present, participating in and directing the treatment.  Dessie Coma, PT 09/07/2022, 2:19 PM   Cristal Ford SPT  09/07/2022  I have reviewed and concur with this student's documentation.   Dessie Coma, PT 09/07/2022 2:21 PM

## 2022-09-10 ENCOUNTER — Ambulatory Visit (HOSPITAL_BASED_OUTPATIENT_CLINIC_OR_DEPARTMENT_OTHER): Payer: Medicaid Other | Admitting: Family Medicine

## 2022-09-16 ENCOUNTER — Ambulatory Visit (HOSPITAL_BASED_OUTPATIENT_CLINIC_OR_DEPARTMENT_OTHER): Payer: Medicaid Other

## 2022-09-16 ENCOUNTER — Encounter (HOSPITAL_BASED_OUTPATIENT_CLINIC_OR_DEPARTMENT_OTHER): Payer: Self-pay

## 2022-09-16 DIAGNOSIS — G8929 Other chronic pain: Secondary | ICD-10-CM

## 2022-09-16 DIAGNOSIS — M6281 Muscle weakness (generalized): Secondary | ICD-10-CM

## 2022-09-16 DIAGNOSIS — M25562 Pain in left knee: Secondary | ICD-10-CM | POA: Diagnosis not present

## 2022-09-16 NOTE — Therapy (Signed)
OUTPATIENT PHYSICAL THERAPY LOWER EXTREMITY EVALUATION   Patient Name: Alyssa Bennett MRN: [id number] DOB:01-Jun-2000, 22 y.o., female Today's Date: 09/16/2022  END OF SESSION:  PT End of Session - 09/16/22 0855     Visit Number 2    Number of Visits 12    Date for PT Re-Evaluation 10/19/22    Authorization Time Period 4 visits approved from  01/19/22-02/17/22    PT Start Time 0852    PT Stop Time 0933    PT Time Calculation (min) 41 min    Activity Tolerance Patient tolerated treatment well    Behavior During Therapy [facility] for tasks assessed/performed              Past Medical History:  Diagnosis Date   ADHD (attention deficit hyperactivity disorder)    Atypical chest pain 12/25/2021   Migraine    Past Surgical History:  Procedure Laterality Date   TONSILLECTOMY     Patient Active Problem List   Diagnosis Date Noted   Pre-diabetes 02/10/2022   Bulimia nervosa 12/31/2021   PTSD (post-traumatic stress disorder) 12/31/2021   Atypical chest pain 12/25/2021   Palpitations 12/04/2021   Stabbing headache 11/09/2021   Gastroesophageal reflux disease 11/09/2021   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   Tourette's syndrome 01/26/2019   Generalized anxiety disorder 05/25/2018   Irregular periods 04/01/2016   Dermatitis 07/09/2015   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 "It keeps locking up on me at work." 7/10 pain level at entry. Has flare up of pain in R upper trap and occipital  neuralgia.    EVAL: Patient is a 22 y/o female who presents with left knee pain for a couple of years but has noticed it has been worse over the past couple of months. Patient also reports that extending the knee makes the knee pop and click. Patient states that she has noticed some bruising bilaterally on the inside of her knees within the last couple of weeks. Denies any falls or kneeling. Patient reports that she recently been having right knee pian as well but the left knee is more severe. Patient reports that the her Right medial knee feels sharp and her Lateral right knee is dull. Patient reports that her right knee and hip has started to hurt as well. Patient states that she had to use a cane a few times to help her walk.  PERTINENT HISTORY: Headaches and migraines, ADHD,  PAIN:  Are you having pain? Yes: NPRS scale: 7/10 Pain location: Left knee, left hip  Pain description: sharp, dull, aching Aggravating factors: standing, walking, Relieving factors: rest, ice helps sometimes   PRECAUTIONS: None  WEIGHT BEARING RESTRICTIONS: No  FALLS:  Has patient fallen in last 6 months? No  LIVING ENVIRONMENT: Lives with: lives with their family Lives in: House/apartment Stairs: No Has following equipment at home: None  OCCUPATION: [employer]   PLOF: Independent  PATIENT GOALS: hiking  NEXT MD VISIT: none  OBJECTIVE:   DIAGNOSTIC FINDINGS:   PATIENT SURVEYS:  COGNITION: Overall cognitive status: Within functional limits for tasks assessed     SENSATION: WFL  EDEMA:    MUSCLE LENGTH:   POSTURE: rounded shoulders  PALPATION: TTP, right medial knee, Left medial knee  LOWER EXTREMITY ROM:  Passive ROM Right eval Left eval  Hip flexion [facility]  Hip extension    Hip abduction    Hip adduction    Hip internal rotation WFL pain at end range  [facility]  Hip external rotation [facility]  Knee flexion    Knee extension    Ankle dorsiflexion    Ankle plantarflexion     Ankle inversion    Ankle eversion     (Blank rows = not tested)  LOWER EXTREMITY MMT:  MMT Right eval Left eval  Hip flexion 18.5 18.7  Hip extension    Hip abduction 23.1 19.6  Hip adduction    Hip internal rotation    Hip external rotation    Knee flexion    Knee extension 23.5 23.7  Ankle dorsiflexion    Ankle plantarflexion    Ankle inversion    Ankle eversion     (Blank rows = not tested)  LOWER EXTREMITY SPECIAL TESTS:    FUNCTIONAL TESTS:    GAIT: Patient reports the use of a cane from time to time.    TODAY'S TREATMENT:                                                                                                                              DATE:   4/25 Recumbent bike L 3 seat 2 Supine Bridge x10 (some back pain) SLR bilateral x10 each  S/l hip abd 2x10ea Prone hip extension 2x10ea Prone HSC 3# x20ea LAQ 3# 5" x10ea Heel raises x10 (some R Great toe pain)  4/16 Supine Bridge x10  Seated Hip abduction yellow band x10 SLR bilateral x5 each   PATIENT EDUCATION:  Education details: HEP, POC Person educated: Patient Education method: Explanation, Demonstration, Tactile cues, Verbal cues, and Handouts Education comprehension: verbalized understanding, returned demonstration, verbal cues required, tactile cues required, and needs further education  HOME EXERCISE PROGRAM: Access Code: [email or web address] URL: [email or web address] Date: 09/07/2022 Prepared by: Lorayne Bender  Exercises - Supine Bridge  - 1 x daily - 7 x weekly - 3 sets - 10 reps - Small Range Straight Leg Raise  - 1 x daily - 7 x weekly - 3 sets - 10 reps - Hooklying Clamshell with Resistance  - 1 x daily - 7 x weekly - 3 sets - 10 reps  ASSESSMENT:  CLINICAL IMPRESSION: Patient with good tolerance for initiation of strength program today. She did c/o occasional joint pain in various joints with most exercises, but this did not prevent pt from completing the exs. She  was educated about importance of regular HEP performance for her condition. Will monitor soreness and progress as tolerated with strength and stability interventions.She may benefit from DN to  upper traps to address pain in this area.   OBJECTIVE IMPAIRMENTS: decreased activity tolerance, decreased endurance, and decreased strength.   ACTIVITY LIMITATIONS: lifting, bending, standing, squatting, and stairs  PARTICIPATION LIMITATIONS: meal prep, cleaning, laundry, driving, shopping, community activity, and occupation  PERSONAL FACTORS: 1-2 comorbidities: Headaches, migraines, ADHD  are also affecting patient's functional outcome.   REHAB POTENTIAL: Excellent  CLINICAL DECISION MAKING: Evolving/moderate complexity  EVALUATION COMPLEXITY: Moderate   GOALS: Goals reviewed with patient? Yes  SHORT TERM GOALS: Target date: 5/14 Patient will increase LE bilaterally by 5 lbs.  Baseline: Goal status: INITIAL  2.  Patient will be independent of HEP.  Baseline:  Goal status: INITIAL  3.  Patient will not have pain at end range of Hip IR. Baseline:  Goal status: INITIAL   LONG TERM GOALS: Target date: 5/28  Patient will be able to stand for greater than 30 minutes without significant increase of pain. Baseline:  Goal status: INITIAL  2.  Patient will be able to return to hiking without pain or discomfort. Baseline:  Goal status: INITIAL  3.  Patient will be able to ambulate community distances without any pain.  Baseline:  Goal status: INITIAL      PLAN:  PT FREQUENCY: 1-2x/week  PT DURATION: 6 weeks  PLANNED INTERVENTIONS: Therapeutic exercises, Therapeutic activity, Neuromuscular re-education, Balance training, Gait training, Patient/Family education, Self Care, Joint mobilization, Dry Needling, Electrical stimulation, Cryotherapy, Moist heat, Taping, Ultrasound, and Manual therapy  PLAN FOR NEXT SESSION: Consider upright bike, SAQ, minisquats. Strengthening of LU  bilaterally and progress to stair training. TPDN for lower back and legs PRN     Lenore Manner, PTA 09/16/2022 10:09 AM

## 2022-09-22 ENCOUNTER — Ambulatory Visit (HOSPITAL_BASED_OUTPATIENT_CLINIC_OR_DEPARTMENT_OTHER): Payer: Medicaid Other | Attending: Nurse Practitioner

## 2022-09-22 ENCOUNTER — Encounter (HOSPITAL_BASED_OUTPATIENT_CLINIC_OR_DEPARTMENT_OTHER): Payer: Self-pay

## 2022-09-22 DIAGNOSIS — M6281 Muscle weakness (generalized): Secondary | ICD-10-CM | POA: Insufficient documentation

## 2022-09-22 DIAGNOSIS — M2550 Pain in unspecified joint: Secondary | ICD-10-CM | POA: Insufficient documentation

## 2022-09-22 DIAGNOSIS — G8929 Other chronic pain: Secondary | ICD-10-CM | POA: Insufficient documentation

## 2022-09-22 DIAGNOSIS — R2689 Other abnormalities of gait and mobility: Secondary | ICD-10-CM | POA: Diagnosis present

## 2022-09-22 DIAGNOSIS — M25562 Pain in left knee: Secondary | ICD-10-CM | POA: Diagnosis present

## 2022-09-22 NOTE — Therapy (Signed)
OUTPATIENT PHYSICAL THERAPY LOWER EXTREMITY TREATMENT   Patient Name: Alyssa Bennett MRN: [id number] DOB:03/20/2001, 22 y.o., female Today's Date: 09/22/2022  END OF SESSION:  PT End of Session - 09/22/22 1017     Visit Number 3    Number of Visits 12    Date for PT Re-Evaluation 10/19/22    Authorization Time Period 4 visits approved from  01/19/22-02/17/22    PT Start Time 0930    PT Stop Time 1015    PT Time Calculation (min) 45 min    Activity Tolerance Patient tolerated treatment well    Behavior During Therapy [facility] for tasks assessed/performed               Past Medical History:  Diagnosis Date   ADHD (attention deficit hyperactivity disorder)    Atypical chest pain 12/25/2021   Migraine    Past Surgical History:  Procedure Laterality Date   TONSILLECTOMY     Patient Active Problem List   Diagnosis Date Noted   Pre-diabetes 02/10/2022   Bulimia nervosa 12/31/2021   PTSD (post-traumatic stress disorder) 12/31/2021   Atypical chest pain 12/25/2021   Palpitations 12/04/2021   Stabbing headache 11/09/2021   Gastroesophageal reflux disease 11/09/2021   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   Tourette's syndrome 01/26/2019   Generalized anxiety disorder 05/25/2018   Irregular periods 04/01/2016   Dermatitis 07/09/2015   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   Classic migraine 07/08/2015   Tachycardia 07/08/2015   Chronic constipation 09/13/2012   Allergic rhinitis 08/04/2009    PCP: Enid Skeens, MD  REFERRING PROVIDER: Huel Cote, MD  REFERRING DIAG: Huel Cote, MD  THERAPY DIAG:  M25.50 (ICD-10-CM) - Pain in joint involving multiple sites   Rationale for Evaluation and Treatment: Rehabilitation  ONSET DATE: a few years ago  SUBJECTIVE:   SUBJECTIVE STATEMENT: Pt reports "I'm overall tired today." She states "I realized that I'm unable to push up to do stairs." Yesterday she felt pinching in R hip, which is still present  today, though less intense. 8/10 pain level today. [place] and had some difficulty.    EVAL: Patient is a 22 y/o female who presents with left knee pain for a couple of years but has noticed it has been worse over the past couple of months. Patient also reports that extending the knee makes the knee pop and click. Patient states that she has noticed some bruising bilaterally on the inside of her knees within the last couple of weeks. Denies any falls or kneeling. Patient reports that she recently been having right knee pian as well but the left knee is more severe. Patient reports that the her Right medial knee feels sharp and her Lateral right knee is dull. Patient reports that her right knee and hip has started to hurt as well. Patient states that she had to use a cane a few times to help her walk.  PERTINENT HISTORY: Headaches and migraines, ADHD,  PAIN:  Are you having pain? Yes: NPRS scale: 7/10 Pain location: Left knee, left hip  Pain description: sharp, dull, aching Aggravating factors: standing, walking, Relieving factors: rest, ice helps sometimes   PRECAUTIONS: None  WEIGHT BEARING RESTRICTIONS: No  FALLS:  Has patient fallen in last 6 months? No  LIVING ENVIRONMENT: Lives with: lives with their family Lives in: House/apartment Stairs: No Has following equipment at home: None  OCCUPATION: [employer]   PLOF: Independent  PATIENT GOALS: hiking  NEXT MD VISIT:  none  OBJECTIVE:   DIAGNOSTIC FINDINGS:   PATIENT SURVEYS:    COGNITION: Overall cognitive status: Within functional limits for tasks assessed     SENSATION: WFL  EDEMA:    MUSCLE LENGTH:   POSTURE: rounded shoulders  PALPATION: TTP, right medial knee, Left medial knee  LOWER EXTREMITY ROM:  Passive ROM Right eval Left eval  Hip flexion [facility]  Hip extension    Hip abduction    Hip adduction    Hip internal rotation WFL pain at end range  [facility]  Hip external  rotation [facility]  Knee flexion    Knee extension    Ankle dorsiflexion    Ankle plantarflexion    Ankle inversion    Ankle eversion     (Blank rows = not tested)  LOWER EXTREMITY MMT:  MMT Right eval Left eval  Hip flexion 18.5 18.7  Hip extension    Hip abduction 23.1 19.6  Hip adduction    Hip internal rotation    Hip external rotation    Knee flexion    Knee extension 23.5 23.7  Ankle dorsiflexion    Ankle plantarflexion    Ankle inversion    Ankle eversion     (Blank rows = not tested)  LOWER EXTREMITY SPECIAL TESTS:    FUNCTIONAL TESTS:    GAIT: Patient reports the use of a cane from time to time.    TODAY'S TREATMENT:                                                                                                                              DATE:    5/1 Recumbent bike no resistance Supine Bridge 2x10  SLR bilateral 2x10 each  S/l hip abd 2x10ea Prone hip extension 2x10ea Prone HSC 4# x20ea Prone hip ER with YTB 2x10 Prone hip IR with ball- 5" x10 LAQ 3# 5" x10ea   4/25 Recumbent bike L 3 seat 2 Supine Bridge x10 (some back pain) SLR bilateral x10 each  S/l hip abd 2x10ea Prone hip extension 2x10ea Prone HSC 3# x20ea LAQ 3# 5" x10ea Heel raises x10 (some R Great toe pain)  4/16 Supine Bridge x10  Seated Hip abduction yellow band x10 SLR bilateral x5 each   PATIENT EDUCATION:  Education details: HEP, POC Person educated: Patient Education method: Explanation, Demonstration, Tactile cues, Verbal cues, and Handouts Education comprehension: verbalized understanding, returned demonstration, verbal cues required, tactile cues required, and needs further education  HOME EXERCISE PROGRAM: Access Code: [email or web address] URL: [email or web address] Date: 09/07/2022 Prepared by: Lorayne Bender  Exercises - Supine Bridge  - 1 x daily - 7 x weekly - 3 sets - 10 reps - Small Range Straight Leg Raise  - 1 x daily - 7 x weekly - 3  sets - 10 reps - Hooklying Clamshell with Resistance  - 1 x daily - 7 x weekly - 3 sets - 10 reps  ASSESSMENT:  CLINICAL IMPRESSION:  Pt again complained of multi joint pain with exercises, but able to complete all tasks. Did have increase in R hip and lateral thigh pain today, potentially from hiking. Monitored her pain level throughout session. She demonstrates greater weakness in L LE compared to R. Some difficulty with isometric IR in prone. Will continue to progress as tolerated.   OBJECTIVE IMPAIRMENTS: decreased activity tolerance, decreased endurance, and decreased strength.   ACTIVITY LIMITATIONS: lifting, bending, standing, squatting, and stairs  PARTICIPATION LIMITATIONS: meal prep, cleaning, laundry, driving, shopping, community activity, and occupation  PERSONAL FACTORS: 1-2 comorbidities: Headaches, migraines, ADHD  are also affecting patient's functional outcome.   REHAB POTENTIAL: Excellent  CLINICAL DECISION MAKING: Evolving/moderate complexity  EVALUATION COMPLEXITY: Moderate   GOALS: Goals reviewed with patient? Yes  SHORT TERM GOALS: Target date: 5/14 Patient will increase LE bilaterally by 5 lbs.  Baseline: Goal status: INITIAL  2.  Patient will be independent of HEP.  Baseline:  Goal status: INITIAL  3.  Patient will not have pain at end range of Hip IR. Baseline:  Goal status: INITIAL   LONG TERM GOALS: Target date: 5/28  Patient will be able to stand for greater than 30 minutes without significant increase of pain. Baseline:  Goal status: INITIAL  2.  Patient will be able to return to hiking without pain or discomfort. Baseline:  Goal status: INITIAL  3.  Patient will be able to ambulate community distances without any pain.  Baseline:  Goal status: INITIAL      PLAN:  PT FREQUENCY: 1-2x/week  PT DURATION: 6 weeks  PLANNED INTERVENTIONS: Therapeutic exercises, Therapeutic activity, Neuromuscular re-education, Balance training,  Gait training, Patient/Family education, Self Care, Joint mobilization, Dry Needling, Electrical stimulation, Cryotherapy, Moist heat, Taping, Ultrasound, and Manual therapy  PLAN FOR NEXT SESSION: Consider upright bike, SAQ, minisquats. Strengthening of LU bilaterally and progress to stair training. TPDN for lower back and legs PRN     Lenore Manner, PTA 09/22/2022 10:44 AM

## 2022-09-24 ENCOUNTER — Other Ambulatory Visit (HOSPITAL_BASED_OUTPATIENT_CLINIC_OR_DEPARTMENT_OTHER): Payer: Self-pay | Admitting: Student

## 2022-09-24 ENCOUNTER — Ambulatory Visit (HOSPITAL_BASED_OUTPATIENT_CLINIC_OR_DEPARTMENT_OTHER): Payer: Medicaid Other

## 2022-09-24 ENCOUNTER — Ambulatory Visit (HOSPITAL_BASED_OUTPATIENT_CLINIC_OR_DEPARTMENT_OTHER): Payer: Medicaid Other | Admitting: Student

## 2022-09-24 ENCOUNTER — Encounter (HOSPITAL_BASED_OUTPATIENT_CLINIC_OR_DEPARTMENT_OTHER): Payer: Self-pay

## 2022-09-24 ENCOUNTER — Encounter (HOSPITAL_BASED_OUTPATIENT_CLINIC_OR_DEPARTMENT_OTHER): Payer: Self-pay | Admitting: Student

## 2022-09-24 DIAGNOSIS — M79671 Pain in right foot: Secondary | ICD-10-CM | POA: Diagnosis not present

## 2022-09-24 DIAGNOSIS — M25561 Pain in right knee: Secondary | ICD-10-CM

## 2022-09-24 DIAGNOSIS — M25562 Pain in left knee: Secondary | ICD-10-CM

## 2022-09-24 DIAGNOSIS — G8929 Other chronic pain: Secondary | ICD-10-CM | POA: Diagnosis not present

## 2022-09-24 NOTE — Progress Notes (Signed)
Chief Complaint: Right knee pain, right big toe pain     History of Present Illness:   Alyssa Bennett is a 22 y.o. female presenting with right knee pain that began yesterday.  She states that she was getting out of bed when she felt like her knee popped out of back in.  She reports pain and swelling on the medial side of the knee.  She has had difficulty with weightbearing and bending the knee.  Her pain is a 7/10.  She has been seeing physical therapy for bilateral knee strengthening which has been helping improve the pain in her left knee however it has continued to lock up and catch periodically.  She has tried ice and elevation as well as Tylenol.  Also notes that she has had pain in the base of her right big toe for 2 to 3 weeks without any known injury.  This pain is overall mild and has been improving slightly.  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   Surgical History:   None  PMH/PSH/Family History/Social History/Meds/Allergies:    Past Medical History:  Diagnosis Date   ADHD (attention deficit hyperactivity disorder)    Atypical chest pain 12/25/2021   Migraine    Past Surgical History:  Procedure Laterality Date   TONSILLECTOMY     Social History   Socioeconomic History   Marital status: Single    Spouse name: Not on file   Number of children: 0   Years of education: Not on file   Highest education level: Some college, no degree  Occupational History   Not on file  Tobacco Use   Smoking status: Never   Smokeless tobacco: Never  Substance and Sexual Activity   Alcohol use: No   Drug use: No   Sexual activity: Never  Other Topics Concern   Not on file  Social History Narrative   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 She enjoys music, science and hanging with friends and family   Caffeine- average 2 a  day, used to drink more caffeine   Social Determinants of Health   Financial Resource Strain: Low Risk  (12/25/2021)   Overall Financial Resource Strain (CARDIA)    Difficulty of Paying Living Expenses: Not hard at all  Food Insecurity: Food Insecurity Present (02/10/2022)   Hunger Vital Sign    Worried About Running Out of Food in the Last Year: Sometimes true    Ran Out of Food in the Last Year: Sometimes true  Transportation Needs: No Transportation Needs (12/25/2021)   PRAPARE - Administrator, Civil Service (Medical): No    Lack of Transportation (Non-Medical): No  Physical Activity: Sufficiently Active (12/31/2021)   Exercise Vital Sign    Days of Exercise per Week: 6 days    Minutes of Exercise per Session: 60 min  Recent Concern: Physical Activity - Inactive (12/25/2021)   Exercise Vital Sign    Days of Exercise per Week: 0 days    Minutes of Exercise per Session: 0 min  Stress: Stress Concern Present (12/31/2021)   [employer] of Occupational Health - Occupational Stress Questionnaire    Feeling of Stress : Very much  Social Connections:  Moderately Isolated (12/31/2021)   Social Connection and Isolation Panel [NHANES]    Frequency of Communication with Friends and Family: More than three times a week    Frequency of Social Gatherings with Friends and Family: Once a week    Attends Religious Services: Never    Database administrator or Organizations: No    Attends Engineer, structural: Never    Marital Status: Living with partner   Family History  Problem Relation Age of Onset   Migraines Mother    ADD / ADHD Mother    Anxiety disorder Mother    Heart attack Father 74   Bradycardia Father    ADD / ADHD Brother    Headache Brother    Migraines Maternal Grandmother    Anxiety disorder Maternal Grandmother    Heart attack Paternal Grandmother    Seizures Neg Hx    Autism Neg Hx    Depression Neg Hx    Bipolar disorder Neg Hx    Schizophrenia Neg  Hx    Allergies  Allergen Reactions   Zithromax [Azithromycin] Itching and Rash   Current Outpatient Medications  Medication Sig Dispense Refill   clotrimazole (ANTIFUNGAL, CLOTRIMAZOLE,) 1 % cream Apply 1 Application topically 2 (two) times daily. For athletes foot. (Patient not taking: Reported on 09/07/2022) 85 g 6   DULoxetine (CYMBALTA) 30 MG capsule TAKE 1 CAPSULE BY MOUTH EVERY DAY 90 capsule 3   esomeprazole (NEXIUM) 40 MG capsule One tab by mouth at dinner time. 90 capsule 3   glucose blood (ACCU-CHEK GUIDE) test strip To check blood sugar at least twice a day and additionally as needed for signs of low or high blood sugar. (Patient not taking: Reported on 09/07/2022) 100 each 99   levocetirizine (XYZAL) 5 MG tablet TAKE 1 TABLET BY MOUTH EVERY DAY IN THE EVENING 90 tablet 4   methylphenidate (RITALIN) 10 MG tablet Take 1 tablet (10 mg total) by mouth daily in the afternoon. (Patient not taking: Reported on 09/07/2022) 30 tablet 0   methylPREDNISolone (MEDROL DOSEPAK) 4 MG TBPK tablet Take per packet instructions (Patient not taking: Reported on 09/07/2022) 1 each 0   norethindrone-ethinyl estradiol-FE (LOESTRIN FE 1/20) 1-20 MG-MCG tablet Take 1 tablet by mouth daily. (Patient not taking: Reported on 09/07/2022) 84 tablet 3   ondansetron (ZOFRAN) 8 MG tablet Take 1 tablet (8 mg total) by mouth every 8 (eight) hours as needed for nausea or vomiting. 20 tablet 2   rizatriptan (MAXALT) 10 MG tablet Take 1 tablet (10 mg total) by mouth as needed for migraine. May repeat in 2 hours if needed 12 tablet 3   Vitamin D, Ergocalciferol, (DRISDOL) 1.25 MG (50000 UNIT) CAPS capsule TAKE 1 CAPSULE EVERY 7 (SEVEN) DAYS. TAKE FOR 12 TOTAL DOSES THAN CAN TRANSITION TO 1000IU OTC DAILY (Patient not taking: Reported on 09/07/2022) 12 capsule 0   Current Facility-Administered Medications  Medication Dose Route Frequency Provider Last Rate Last Admin   cyanocobalamin (VITAMIN B12) injection 1,000 mcg  1,000  mcg Intramuscular Q30 days Early, [name], NP   1,000 mcg at 02/12/22 1140   No results found.  Review of Systems:   A ROS was performed including pertinent positives and negatives as documented in the HPI.  Physical Exam :   Constitutional: NAD and appears stated age Neurological: Alert and oriented Psych: Appropriate affect and cooperative There were no vitals taken for this visit.   Comprehensive Musculoskeletal Exam:    Tenderness to palpation around  the right patella particularly medially.  No joint tenderness.  Mild soft tissue swelling of medial knee.  Positive patellar apprehension test.  No laxity with varus or active stress.  Negative Lachman's.  Mild tenderness noted at first MTP joint.  No swelling or erythema of this area.  No pain with flexion or extension of the big toe with 5/5 strength.  DP/TP pulses 2+.  Sensation to the big toe equal and intact.   Imaging:   Xray (4 views right knee, 3 views right foot): Normal   I personally reviewed and interpreted the radiographs.   Assessment:   22 y.o. female with acute right knee pain.  Based on her symptoms and exam I believe an MRI of the right knee is indicated at this time to assess cartilage and ligament integrity.  I would also like to obtain MRI of the right knee as well at this time as she has continually had mechanical symptoms and has been working with physical therapy.  Her toe pain does not appear significant and I suspect this may be a mild sprain.  Recommend continuing with Tylenol for pain control and can add on topical as needed.  I would like her to continue therapy as tolerated in the meantime.  Will plan to see her back in 4 weeks for MRI review.   Plan:    -Return to clinic in 4 weeks for MRI follow-up.     I personally saw and evaluated the patient, and participated in the management and treatment plan.  Hazle Nordmann, PA-C Orthopedics  This document was dictated using Manufacturing engineer. A reasonable attempt at proof reading has been made to minimize errors.

## 2022-09-28 ENCOUNTER — Ambulatory Visit (HOSPITAL_BASED_OUTPATIENT_CLINIC_OR_DEPARTMENT_OTHER): Payer: Medicaid Other | Admitting: Physical Therapy

## 2022-09-28 ENCOUNTER — Encounter (HOSPITAL_BASED_OUTPATIENT_CLINIC_OR_DEPARTMENT_OTHER): Payer: Self-pay | Admitting: Physical Therapy

## 2022-09-28 DIAGNOSIS — M25562 Pain in left knee: Secondary | ICD-10-CM | POA: Diagnosis not present

## 2022-09-28 DIAGNOSIS — G8929 Other chronic pain: Secondary | ICD-10-CM

## 2022-09-28 DIAGNOSIS — M2550 Pain in unspecified joint: Secondary | ICD-10-CM

## 2022-09-28 DIAGNOSIS — R2689 Other abnormalities of gait and mobility: Secondary | ICD-10-CM

## 2022-09-28 DIAGNOSIS — M6281 Muscle weakness (generalized): Secondary | ICD-10-CM

## 2022-09-28 NOTE — Therapy (Signed)
OUTPATIENT PHYSICAL THERAPY LOWER EXTREMITY TREATMENT   Patient Name: Alyssa Bennett MRN: [id number] DOB:[birth year], 22 y.o., female Today's Date: 09/28/2022  END OF SESSION:  PT End of Session - 09/28/22 1315     Visit Number 4    Number of Visits 12    Date for PT Re-Evaluation 10/19/22    Authorization Time Period 4 visits approved from  01/19/22-02/17/22    PT Start Time 1315    PT Stop Time 1355    PT Time Calculation (min) 40 min    Activity Tolerance Patient tolerated treatment well    Behavior During Therapy [facility] for tasks assessed/performed               Past Medical History:  Diagnosis Date   ADHD (attention deficit hyperactivity disorder)    Atypical chest pain 12/25/2021   Migraine    Past Surgical History:  Procedure Laterality Date   TONSILLECTOMY     Patient Active Problem List   Diagnosis Date Noted   Pre-diabetes 02/10/2022   Bulimia nervosa 12/31/2021   PTSD (post-traumatic stress disorder) 12/31/2021   Atypical chest pain 12/25/2021   Palpitations 12/04/2021   Stabbing headache 11/09/2021   Gastroesophageal reflux disease 11/09/2021   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   Tourette's syndrome 01/26/2019   Generalized anxiety disorder 05/25/2018   Irregular periods 04/01/2016   Dermatitis 07/09/2015   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 Pt has increased pain with sitting cross legged. Pain is significantly increase while at work with standing. Pt still with  neck issues and HA pain.    EVAL: Patient is a 22 y/o female who presents with left knee pain for a couple of years but has noticed it has been worse over the past couple of months. Patient also reports that extending the knee makes the knee pop and click. Patient states that she has noticed some bruising bilaterally on the inside of her knees within the last couple of weeks. Denies any falls or kneeling. Patient reports that she recently been having right knee pian as well but the left knee is more severe. Patient reports that the her Right medial knee feels sharp and her Lateral right knee is dull. Patient reports that her right knee and hip has started to hurt as well. Patient states that she had to use a cane a few times to help her walk.  PERTINENT HISTORY: Headaches and migraines, ADHD,  PAIN:  Are you having pain? Yes: NPRS scale: 7/10 Pain location: Left knee, left hip  Pain description: sharp, dull, aching Aggravating factors: standing, walking, Relieving factors: rest, ice helps sometimes   PRECAUTIONS: None  WEIGHT BEARING RESTRICTIONS: No  FALLS:  Has patient fallen in last 6 months? No  LIVING ENVIRONMENT: Lives with: lives with their family Lives in: House/apartment Stairs: No Has following equipment at home: None  OCCUPATION: [employer]   PLOF: Independent  PATIENT GOALS: hiking  NEXT MD VISIT: none  OBJECTIVE:   DIAGNOSTIC FINDINGS:   PATIENT SURVEYS:  COGNITION: Overall cognitive status: Within functional limits for tasks assessed     SENSATION: WFL  EDEMA:    MUSCLE LENGTH:   POSTURE: rounded shoulders  PALPATION: TTP, right medial knee, Left medial knee  LOWER EXTREMITY ROM:  Passive ROM Right eval Left eval  Hip flexion [facility]  Hip extension    Hip abduction    Hip adduction    Hip internal rotation WFL pain at end range  [facility]  Hip external rotation [facility]  Knee flexion    Knee extension    Ankle dorsiflexion    Ankle  plantarflexion    Ankle inversion    Ankle eversion     (Blank rows = not tested)  LOWER EXTREMITY MMT:  MMT Right eval Left eval  Hip flexion 18.5 18.7  Hip extension    Hip abduction 23.1 19.6  Hip adduction    Hip internal rotation    Hip external rotation    Knee flexion    Knee extension 23.5 23.7  Ankle dorsiflexion    Ankle plantarflexion    Ankle inversion    Ankle eversion     (Blank rows = not tested)  LOWER EXTREMITY SPECIAL TESTS:    FUNCTIONAL TESTS:    GAIT: Patient reports the use of a cane from time to time.    TODAY'S TREATMENT:                                                                                                                              DATE:   5/7 Manual: STM to bilat UT and C/S paraspinals, levator Manual traction (gentle) UPA and CPA C3-6 grade II  KT taping for L patellar proprioception and demo for self taping   5/1 Recumbent bike no resistance Supine Bridge 2x10  SLR bilateral 2x10 each  S/l hip abd 2x10ea Prone hip extension 2x10ea Prone HSC 4# x20ea Prone hip ER with YTB 2x10 Prone hip IR with ball- 5" x10 LAQ 3# 5" x10ea   4/25 Recumbent bike L 3 seat 2 Supine Bridge x10 (some back pain) SLR bilateral x10 each  S/l hip abd 2x10ea Prone hip extension 2x10ea Prone HSC 3# x20ea LAQ 3# 5" x10ea Heel raises x10 (some R Great toe pain)  4/16 Supine Bridge x10  Seated Hip abduction yellow band x10 SLR bilateral x5 each   PATIENT EDUCATION:  Education details: HEP, POC Person educated: Patient Education method: Explanation, Demonstration, Tactile cues, Verbal cues, and Handouts Education comprehension: verbalized understanding, returned demonstration, verbal cues required, tactile cues required, and needs further education  HOME EXERCISE PROGRAM: Access Code: [email or web address] URL: [email or web address] Date: 09/07/2022 Prepared by: Lorayne Bender  Exercises - Supine Bridge  - 1 x daily  - 7 x weekly - 3 sets - 10 reps - Small Range Straight Leg Raise  - 1 x daily - 7 x weekly - 3 sets - 10 reps - Hooklying Clamshell with Resistance  -  1 x daily - 7 x weekly - 3 sets - 10 reps  ASSESSMENT:  CLINICAL IMPRESSION: Pt LE exercise limited today due to upcoming MRI for ligamentous integrity. Pt session focused on C/S pain and edu about self taping technique for bilat knees. Plan for pt to have imaging for bilat LE prior to continuation of therapy. Pt advised to continue with quad activation and knee ROM at home for prevention of excessive stiffness and pain but not high intensity loading. Plan to continue with POC as tolerated after MRI clearance.   OBJECTIVE IMPAIRMENTS: decreased activity tolerance, decreased endurance, and decreased strength.   ACTIVITY LIMITATIONS: lifting, bending, standing, squatting, and stairs  PARTICIPATION LIMITATIONS: meal prep, cleaning, laundry, driving, shopping, community activity, and occupation  PERSONAL FACTORS: 1-2 comorbidities: Headaches, migraines, ADHD  are also affecting patient's functional outcome.   REHAB POTENTIAL: Excellent  CLINICAL DECISION MAKING: Evolving/moderate complexity  EVALUATION COMPLEXITY: Moderate   GOALS: Goals reviewed with patient? Yes  SHORT TERM GOALS: Target date: 5/14 Patient will increase LE bilaterally by 5 lbs.  Baseline: Goal status: INITIAL  2.  Patient will be independent of HEP.  Baseline:  Goal status: INITIAL  3.  Patient will not have pain at end range of Hip IR. Baseline:  Goal status: INITIAL   LONG TERM GOALS: Target date: 5/28  Patient will be able to stand for greater than 30 minutes without significant increase of pain. Baseline:  Goal status: INITIAL  2.  Patient will be able to return to hiking without pain or discomfort. Baseline:  Goal status: INITIAL  3.  Patient will be able to ambulate community distances without any pain.  Baseline:  Goal status:  INITIAL      PLAN:  PT FREQUENCY: 1-2x/week  PT DURATION: 6 weeks  PLANNED INTERVENTIONS: Therapeutic exercises, Therapeutic activity, Neuromuscular re-education, Balance training, Gait training, Patient/Family education, Self Care, Joint mobilization, Dry Needling, Electrical stimulation, Cryotherapy, Moist heat, Taping, Ultrasound, and Manual therapy  PLAN FOR NEXT SESSION: Consider upright bike, SAQ, minisquats. Strengthening of LU bilaterally and progress to stair training. TPDN for lower back and legs PRN     Zebedee Iba, PT 09/28/2022 2:01 PM

## 2022-09-30 ENCOUNTER — Encounter (HOSPITAL_BASED_OUTPATIENT_CLINIC_OR_DEPARTMENT_OTHER): Payer: Medicaid Other

## 2022-10-04 ENCOUNTER — Ambulatory Visit (HOSPITAL_BASED_OUTPATIENT_CLINIC_OR_DEPARTMENT_OTHER): Payer: Medicaid Other

## 2022-10-07 ENCOUNTER — Encounter (HOSPITAL_BASED_OUTPATIENT_CLINIC_OR_DEPARTMENT_OTHER): Payer: Self-pay

## 2022-10-07 ENCOUNTER — Ambulatory Visit (HOSPITAL_BASED_OUTPATIENT_CLINIC_OR_DEPARTMENT_OTHER)
Admission: RE | Admit: 2022-10-07 | Discharge: 2022-10-07 | Disposition: A | Discharge status: Home / Self Care | Payer: Medicaid Other | Source: Ambulatory Visit | Attending: Student | Admitting: Student

## 2022-10-07 ENCOUNTER — Ambulatory Visit (HOSPITAL_BASED_OUTPATIENT_CLINIC_OR_DEPARTMENT_OTHER): Payer: Medicaid Other

## 2022-10-07 DIAGNOSIS — M25562 Pain in left knee: Secondary | ICD-10-CM | POA: Insufficient documentation

## 2022-10-07 DIAGNOSIS — R2689 Other abnormalities of gait and mobility: Secondary | ICD-10-CM

## 2022-10-07 DIAGNOSIS — M25561 Pain in right knee: Secondary | ICD-10-CM | POA: Insufficient documentation

## 2022-10-07 DIAGNOSIS — G8929 Other chronic pain: Secondary | ICD-10-CM

## 2022-10-07 DIAGNOSIS — M6281 Muscle weakness (generalized): Secondary | ICD-10-CM

## 2022-10-07 NOTE — Therapy (Signed)
OUTPATIENT PHYSICAL THERAPY LOWER EXTREMITY TREATMENT   Patient Name: JYAH BONNET MRN: [id number] DOB:Sep 25, 2000, 22 y.o., female Today's Date: 10/07/2022  END OF SESSION:  PT End of Session - 10/07/22 1222     Visit Number 5    Number of Visits 12    Date for PT Re-Evaluation 10/19/22    Authorization Time Period 4 visits approved from  01/19/22-02/17/22    PT Start Time 1115    PT Stop Time 1145    PT Time Calculation (min) 30 min    Activity Tolerance Patient tolerated treatment well    Behavior During Therapy [facility] for tasks assessed/performed               Past Medical History:  Diagnosis Date   ADHD (attention deficit hyperactivity disorder)    Atypical chest pain 12/25/2021   Migraine    Past Surgical History:  Procedure Laterality Date   TONSILLECTOMY     Patient Active Problem List   Diagnosis Date Noted   Pre-diabetes 02/10/2022   Bulimia nervosa 12/31/2021   PTSD (post-traumatic stress disorder) 12/31/2021   Atypical chest pain 12/25/2021   Palpitations 12/04/2021   Stabbing headache 11/09/2021   Gastroesophageal reflux disease 11/09/2021   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   Tourette's syndrome 01/26/2019   Generalized anxiety disorder 05/25/2018   Irregular periods 04/01/2016   Dermatitis 07/09/2015   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 Has 9/10 pain in neck and 7/10 in knees.    EVAL: Patient is a 22 y/o female who presents with left knee pain for a couple of years  but has noticed it has been worse over the past couple of months. Patient also reports that extending the knee makes the knee pop and click. Patient states that she has noticed some bruising bilaterally on the inside of her knees within the last couple of weeks. Denies any falls or kneeling. Patient reports that she recently been having right knee pian as well but the left knee is more severe. Patient reports that the her Right medial knee feels sharp and her Lateral right knee is dull. Patient reports that her right knee and hip has started to hurt as well. Patient states that she had to use a cane a few times to help her walk.  PERTINENT HISTORY: Headaches and migraines, ADHD,  PAIN:  Are you having pain? Yes: NPRS scale: 9/10 Pain location: Left knee, left hip and neck  Pain description: sharp, dull, aching Aggravating factors: standing, walking, Relieving factors: rest, ice helps sometimes   PRECAUTIONS: None  WEIGHT BEARING RESTRICTIONS: No  FALLS:  Has patient fallen in last 6 months? No  LIVING ENVIRONMENT: Lives with: lives with their family Lives in: House/apartment Stairs: No Has following equipment at home: None  OCCUPATION: [employer]   PLOF: Independent  PATIENT GOALS: hiking  NEXT MD VISIT: none  OBJECTIVE:   DIAGNOSTIC FINDINGS:   PATIENT SURVEYS:    COGNITION: Overall cognitive status: Within functional limits for tasks assessed     SENSATION: [facility]  EDEMA:    MUSCLE LENGTH:   POSTURE: rounded shoulders  PALPATION: TTP, right medial knee, Left medial knee  LOWER EXTREMITY ROM:  Passive ROM Right eval Left eval  Hip flexion [facility]  Hip extension    Hip abduction    Hip adduction    Hip internal rotation WFL pain at end range  [facility]  Hip external rotation [facility]  Knee flexion    Knee extension    Ankle dorsiflexion    Ankle plantarflexion    Ankle inversion    Ankle eversion     (Blank rows = not tested)  LOWER EXTREMITY  MMT:  MMT Right eval Left eval  Hip flexion 18.5 18.7  Hip extension    Hip abduction 23.1 19.6  Hip adduction    Hip internal rotation    Hip external rotation    Knee flexion    Knee extension 23.5 23.7  Ankle dorsiflexion    Ankle plantarflexion    Ankle inversion    Ankle eversion     (Blank rows = not tested)  LOWER EXTREMITY SPECIAL TESTS:    FUNCTIONAL TESTS:    GAIT: Patient reports the use of a cane from time to time.    TODAY'S TREATMENT:                                                                                                                              DATE:   5/15: -STM to cervical paraspinlas Sub occipital release -Supine chin tucks 3" x10 -Seated scap retraction 5" x20 -doorway pec stretch 20sec x3  5/7 Manual: STM to bilat UT and C/S paraspinals, levator Manual traction (gentle) UPA and CPA C3-6 grade II  KT taping for L patellar proprioception and demo for self taping   5/1 Recumbent bike no resistance Supine Bridge 2x10  SLR bilateral 2x10 each  S/l hip abd 2x10ea Prone hip extension 2x10ea Prone HSC 4# x20ea Prone hip ER with YTB 2x10 Prone hip IR with ball- 5" x10 LAQ 3# 5" x10ea   4/25 Recumbent bike L 3 seat 2 Supine Bridge x10 (some back pain) SLR bilateral x10 each  S/l hip abd 2x10ea Prone hip extension 2x10ea Prone HSC 3# x20ea LAQ 3# 5" x10ea Heel raises x10 (some R Great toe pain)  4/16 Supine Bridge x10  Seated Hip abduction yellow band x10 SLR bilateral x5 each   PATIENT EDUCATION:  Education details: HEP, POC Person educated: Patient Education method: Explanation, Demonstration, Tactile cues, Verbal cues, and Handouts Education comprehension: verbalized understanding, returned demonstration, verbal cues required, tactile cues required, and needs further education  HOME EXERCISE PROGRAM: Access Code: [email or web address] URL: [email or web address] Date: 09/07/2022 Prepared by: Lorayne Bender  Exercises - Supine Bridge  - 1 x daily - 7 x weekly - 3 sets - 10 reps - Small Range Straight Leg Raise  - 1 x daily - 7 x weekly - 3 sets - 10 reps -  Hooklying Clamshell with Resistance  - 1 x daily - 7 x weekly - 3 sets - 10 reps  ASSESSMENT:  CLINICAL IMPRESSION: Focused on neck and cervical complaints today due to pt having MRI this afternoon for knees. Very tender to palpation of cerivcal mm, but pt reported significant relief from this. Educated pt about ktape use and removal as she was unsure if she was using it properly. Will await MRI results for bilateral knees.   OBJECTIVE IMPAIRMENTS: decreased activity tolerance, decreased endurance, and decreased strength.   ACTIVITY LIMITATIONS: lifting, bending, standing, squatting, and stairs  PARTICIPATION LIMITATIONS: meal prep, cleaning, laundry, driving, shopping, community activity, and occupation  PERSONAL FACTORS: 1-2 comorbidities: Headaches, migraines, ADHD  are also affecting patient's functional outcome.   REHAB POTENTIAL: Excellent  CLINICAL DECISION MAKING: Evolving/moderate complexity  EVALUATION COMPLEXITY: Moderate   GOALS: Goals reviewed with patient? Yes  SHORT TERM GOALS: Target date: 5/14 Patient will increase LE bilaterally by 5 lbs.  Baseline: Goal status: INITIAL  2.  Patient will be independent of HEP.  Baseline:  Goal status: INITIAL  3.  Patient will not have pain at end range of Hip IR. Baseline:  Goal status: INITIAL   LONG TERM GOALS: Target date: 5/28  Patient will be able to stand for greater than 30 minutes without significant increase of pain. Baseline:  Goal status: INITIAL  2.  Patient will be able to return to hiking without pain or discomfort. Baseline:  Goal status: INITIAL  3.  Patient will be able to ambulate community distances without any pain.  Baseline:  Goal status: INITIAL      PLAN:  PT FREQUENCY: 1-2x/week  PT DURATION: 6 weeks  PLANNED  INTERVENTIONS: Therapeutic exercises, Therapeutic activity, Neuromuscular re-education, Balance training, Gait training, Patient/Family education, Self Care, Joint mobilization, Dry Needling, Electrical stimulation, Cryotherapy, Moist heat, Taping, Ultrasound, and Manual therapy  PLAN FOR NEXT SESSION: Consider upright bike, SAQ, minisquats. Strengthening of LU bilaterally and progress to stair training. TPDN for lower back and legs PRN     Riki Altes, PTA  10/07/2022 12:51 PM

## 2022-10-12 ENCOUNTER — Encounter (HOSPITAL_BASED_OUTPATIENT_CLINIC_OR_DEPARTMENT_OTHER): Payer: Self-pay | Admitting: Physical Therapy

## 2022-10-12 ENCOUNTER — Ambulatory Visit (HOSPITAL_BASED_OUTPATIENT_CLINIC_OR_DEPARTMENT_OTHER): Payer: Medicaid Other | Admitting: Physical Therapy

## 2022-10-12 DIAGNOSIS — G8929 Other chronic pain: Secondary | ICD-10-CM

## 2022-10-12 DIAGNOSIS — M6281 Muscle weakness (generalized): Secondary | ICD-10-CM

## 2022-10-12 DIAGNOSIS — R2689 Other abnormalities of gait and mobility: Secondary | ICD-10-CM

## 2022-10-12 DIAGNOSIS — M25562 Pain in left knee: Secondary | ICD-10-CM | POA: Diagnosis not present

## 2022-10-12 NOTE — Therapy (Signed)
OUTPATIENT PHYSICAL THERAPY LOWER EXTREMITY TREATMENT   Patient Name: Alyssa Bennett MRN: [id number] DOB:[birth year], 22 y.o., female Today's Date: 10/07/2022  END OF SESSION:  PT End of Session - 10/07/22 1222     Visit Number 5    Number of Visits 12    Date for PT Re-Evaluation 10/19/22    Authorization Time Period 4 visits approved from  01/19/22-02/17/22    PT Start Time 1115    PT Stop Time 1145    PT Time Calculation (min) 30 min    Activity Tolerance Patient tolerated treatment well    Behavior During Therapy [facility] for tasks assessed/performed               Past Medical History:  Diagnosis Date   ADHD (attention deficit hyperactivity disorder)    Atypical chest pain 12/25/2021   Migraine    Past Surgical History:  Procedure Laterality Date   TONSILLECTOMY     Patient Active Problem List   Diagnosis Date Noted   Pre-diabetes 02/10/2022   Bulimia nervosa 12/31/2021   PTSD (post-traumatic stress disorder) 12/31/2021   Atypical chest pain 12/25/2021   Palpitations 12/04/2021   Stabbing headache 11/09/2021   Gastroesophageal reflux disease 11/09/2021   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   Tourette's syndrome 01/26/2019   Generalized anxiety disorder 05/25/2018   Irregular periods 04/01/2016   Dermatitis 07/09/2015   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 Has 9/10 pain in neck and 7/10 in knees.    EVAL: Patient is a 22 y/o female who presents with left knee pain for a couple of years  but has noticed it has been worse over the past couple of months. Patient also reports that extending the knee makes the knee pop and click. Patient states that she has noticed some bruising bilaterally on the inside of her knees within the last couple of weeks. Denies any falls or kneeling. Patient reports that she recently been having right knee pian as well but the left knee is more severe. Patient reports that the her Right medial knee feels sharp and her Lateral right knee is dull. Patient reports that her right knee and hip has started to hurt as well. Patient states that she had to use a cane a few times to help her walk.  PERTINENT HISTORY: Headaches and migraines, ADHD,  PAIN:  Are you having pain? Yes: NPRS scale: 9/10 Pain location: Left knee, left hip and neck  Pain description: sharp, dull, aching Aggravating factors: standing, walking, Relieving factors: rest, ice helps sometimes   PRECAUTIONS: None  WEIGHT BEARING RESTRICTIONS: No  FALLS:  Has patient fallen in last 6 months? No  LIVING ENVIRONMENT: Lives with: lives with their family Lives in: House/apartment Stairs: No Has following equipment at home: None  OCCUPATION: [employer]   PLOF: Independent  PATIENT GOALS: hiking  NEXT MD VISIT: none  OBJECTIVE:   DIAGNOSTIC FINDINGS:   PATIENT SURVEYS:    COGNITION: Overall cognitive status: Within functional limits for tasks assessed     SENSATION: [facility]  EDEMA:    MUSCLE LENGTH:   POSTURE: rounded shoulders  PALPATION: TTP, right medial knee, Left medial knee  LOWER EXTREMITY ROM:  Passive ROM Right eval Left eval  Hip flexion [facility]  Hip extension    Hip abduction    Hip adduction    Hip internal rotation WFL pain at end range  [facility]  Hip external rotation [facility]  Knee flexion    Knee extension    Ankle dorsiflexion    Ankle plantarflexion    Ankle inversion    Ankle eversion     (Blank rows = not tested)  LOWER EXTREMITY  MMT:  MMT Right eval Left eval  Hip flexion 18.5 18.7  Hip extension    Hip abduction 23.1 19.6  Hip adduction    Hip internal rotation    Hip external rotation    Knee flexion    Knee extension 23.5 23.7  Ankle dorsiflexion    Ankle plantarflexion    Ankle inversion    Ankle eversion     (Blank rows = not tested)  LOWER EXTREMITY SPECIAL TESTS:    FUNCTIONAL TESTS:    GAIT: Patient reports the use of a cane from time to time.    TODAY'S TREATMENT:                                                                                                                              DATE:  5/21 Nustep 5 minutes level 2 Quad set bilateral x 20 Straight leg raise 2 x 10 Short arc quad 3 x 10 bilateral  5/15: -STM to cervical paraspinlas Sub occipital release -Supine chin tucks 3" x10 -Seated scap retraction 5" x20 -doorway pec stretch 20sec x3  5/7 Manual: STM to bilat UT and C/S paraspinals, levator Manual traction (gentle) UPA and CPA C3-6 grade II  KT taping for L patellar proprioception and demo for self taping    PATIENT EDUCATION:  Education details: HEP, POC Person educated: Patient Education method: Programmer, multimedia, Facilities manager, Actor cues, Verbal cues, and Handouts Education comprehension: verbalized understanding, returned demonstration, verbal cues required, tactile cues required, and needs further education  HOME EXERCISE PROGRAM: Access Code: [email or web address] URL: [email or web address] Date: 09/07/2022 Prepared by: Lorayne Bender  Exercises - Supine Bridge  - 1 x daily - 7 x weekly - 3 sets - 10 reps - Small Range Straight Leg Raise  - 1 x daily - 7 x weekly - 3 sets - 10 reps - Hooklying Clamshell with Resistance  - 1 x daily - 7 x weekly - 3 sets - 10 reps  ASSESSMENT:  CLINICAL IMPRESSION:  Therapy focused the patient's knee today.  She tolerated treatment well.  Her treatment was somewhat limited by time.  She had another appointment.   She was advised to continue to continue strengthening is much as tolerated.  She reported some pain and unstable feeling with short arc quads otherwise tolerated exercises well.  She will  wear loosefitting pants and we will review stabilization taping next visit.  She will be seeing her MD for follow-up soon to discuss recent MRIs of both knees.  Will perform reassessment next visit.  OBJECTIVE IMPAIRMENTS: decreased activity tolerance, decreased endurance, and decreased strength.   ACTIVITY LIMITATIONS: lifting, bending, standing, squatting, and stairs  PARTICIPATION LIMITATIONS: meal prep, cleaning, laundry, driving, shopping, community activity, and occupation  PERSONAL FACTORS: 1-2 comorbidities: Headaches, migraines, ADHD  are also affecting patient's functional outcome.   REHAB POTENTIAL: Excellent  CLINICAL DECISION MAKING: Evolving/moderate complexity  EVALUATION COMPLEXITY: Moderate   GOALS: Goals reviewed with patient? Yes  SHORT TERM GOALS: Target date: 5/14 Patient will increase LE bilaterally by 5 lbs.  Baseline: Goal status: INITIAL  2.  Patient will be independent of HEP.  Baseline:  Goal status: INITIAL  3.  Patient will not have pain at end range of Hip IR. Baseline:  Goal status: INITIAL   LONG TERM GOALS: Target date: 5/28  Patient will be able to stand for greater than 30 minutes without significant increase of pain. Baseline:  Goal status: INITIAL  2.  Patient will be able to return to hiking without pain or discomfort. Baseline:  Goal status: INITIAL  3.  Patient will be able to ambulate community distances without any pain.  Baseline:  Goal status: INITIAL      PLAN:  PT FREQUENCY: 1-2x/week  PT DURATION: 6 weeks  PLANNED INTERVENTIONS: Therapeutic exercises, Therapeutic activity, Neuromuscular re-education, Balance training, Gait training, Patient/Family education, Self Care, Joint mobilization, Dry Needling, Electrical stimulation,  Cryotherapy, Moist heat, Taping, Ultrasound, and Manual therapy  PLAN FOR NEXT SESSION: Consider upright bike, SAQ, minisquats. Strengthening of LU bilaterally and progress to stair training. TPDN for lower back and legs PRN     Lorayne Bender PT DPT   10/07/2022 12:51 PM

## 2022-10-15 ENCOUNTER — Ambulatory Visit (HOSPITAL_BASED_OUTPATIENT_CLINIC_OR_DEPARTMENT_OTHER): Payer: Medicaid Other | Admitting: Physical Therapy

## 2022-10-19 ENCOUNTER — Encounter (HOSPITAL_BASED_OUTPATIENT_CLINIC_OR_DEPARTMENT_OTHER): Payer: Medicaid Other | Admitting: Physical Therapy

## 2022-10-21 ENCOUNTER — Ambulatory Visit (HOSPITAL_BASED_OUTPATIENT_CLINIC_OR_DEPARTMENT_OTHER): Payer: Medicaid Other | Admitting: Physical Therapy

## 2022-10-28 IMAGING — US US PELVIS COMPLETE TRANSABD/TRANSVAG W DUPLEX AND/OR DOPPLER
1 series · 13 of 25 positions shown · non-contrast
Comparison: April 26, 2021

CLINICAL DATA: Right lower quadrant pain.

EXAM:
TRANSABDOMINAL AND TRANSVAGINAL ULTRASOUND OF PELVIS
DOPPLER ULTRASOUND OF OVARIES
TECHNIQUE: Both transabdominal and transvaginal ultrasound examinations of the
pelvis were performed. Transabdominal technique was performed for
global imaging of the pelvis including uterus, ovaries, adnexal
regions, and pelvic cul-de-sac.
It was necessary to proceed with endovaginal exam following the
transabdominal exam to visualize the uterus, endometrium, bilateral
adnexa and bilateral ovaries. Color and duplex Doppler ultrasound
was utilized to evaluate blood flow to the ovaries.

[Series 1: us pelvic complete w transvaginal and torsion righ · 13 of 61 slices shown]
[im 1/61]
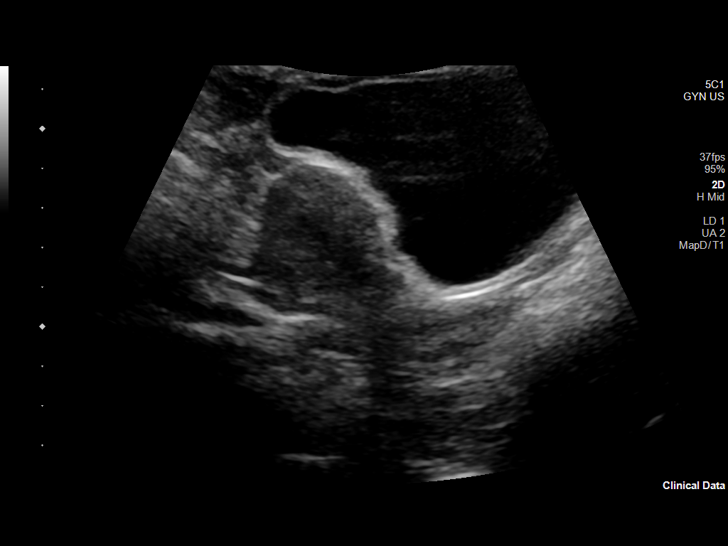
[im 6/61]
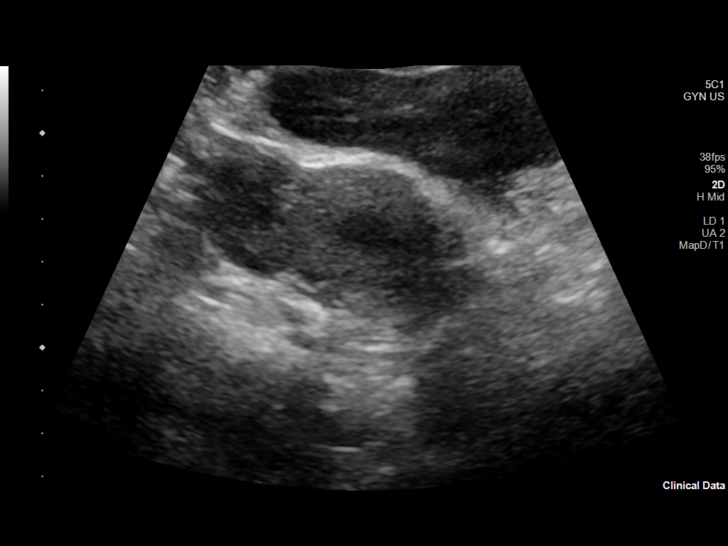
[im 11/61]
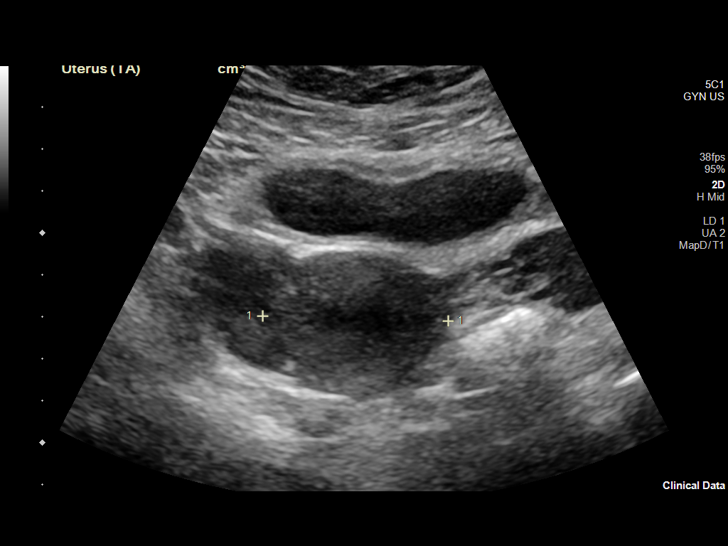
[im 16/61]
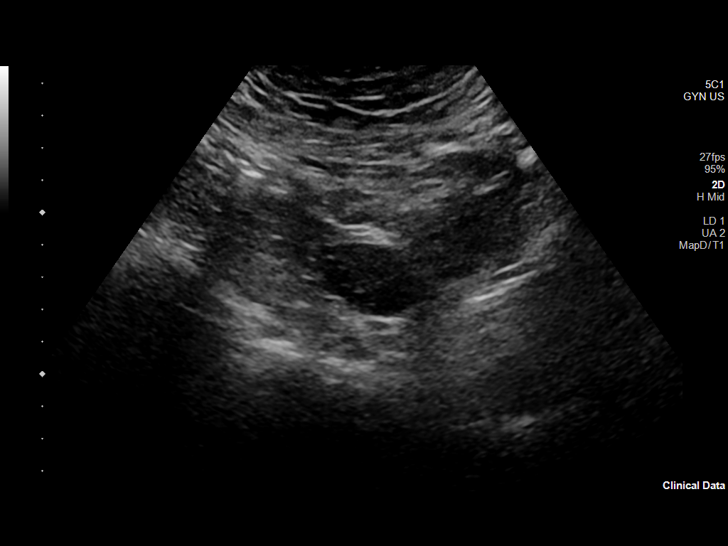
[im 21/61]
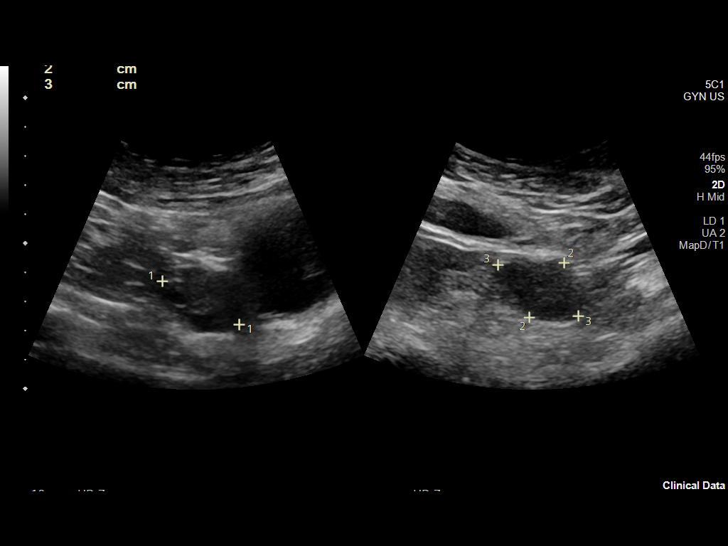
[im 26/61]
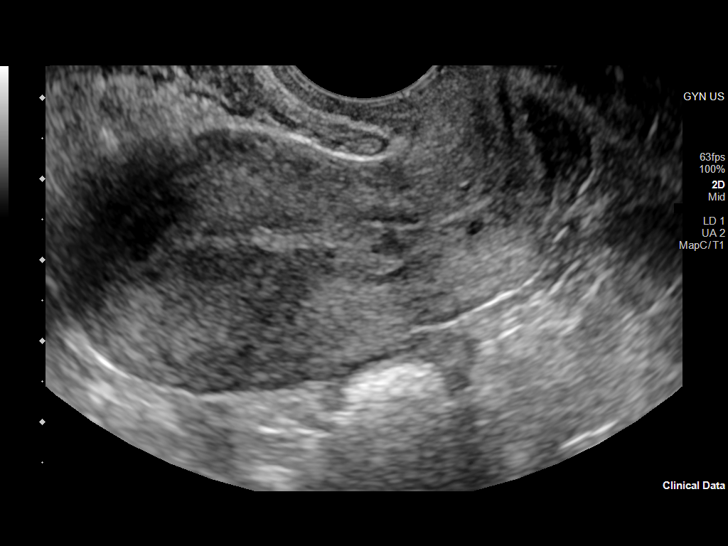
[im 31/61]
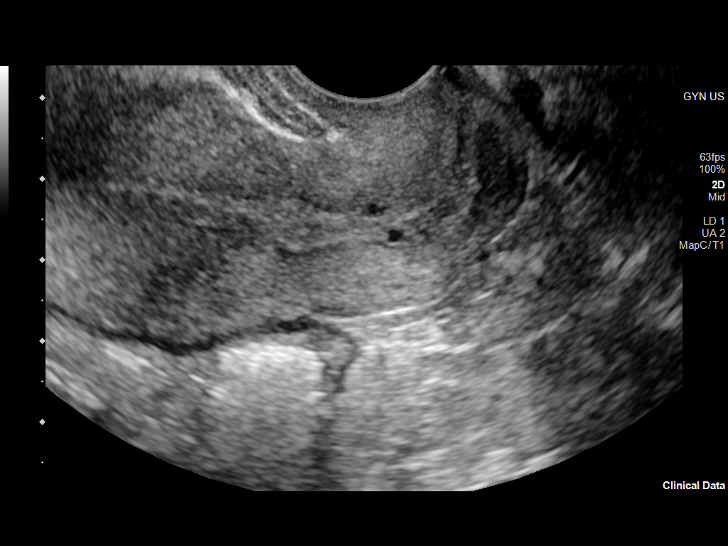
[im 36/61]
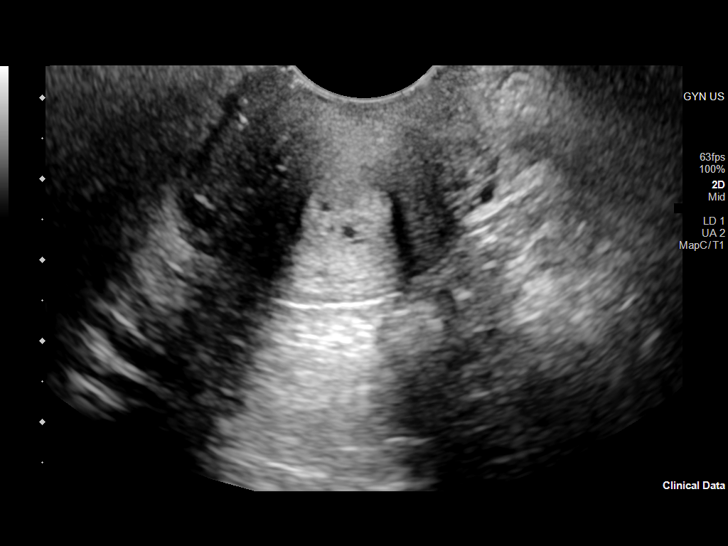
[im 41/61]
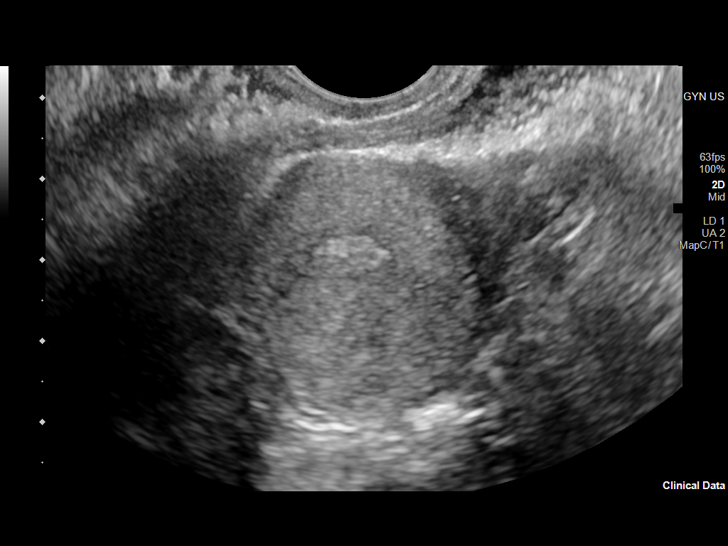
[im 46/61]
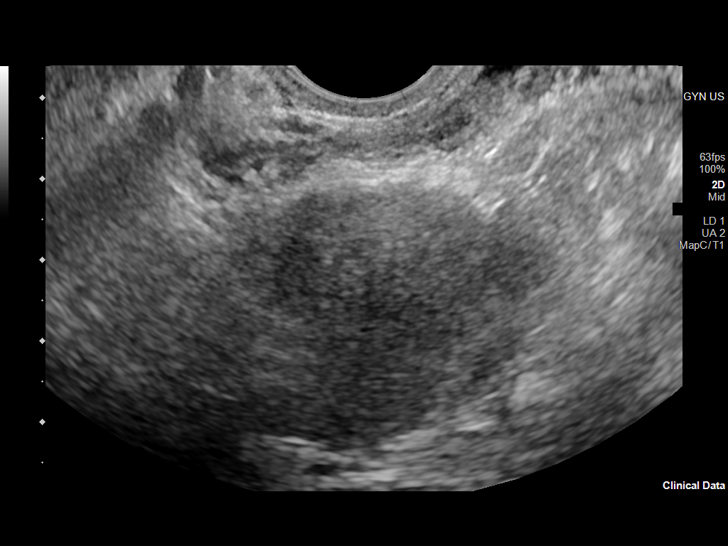
[im 51/61]
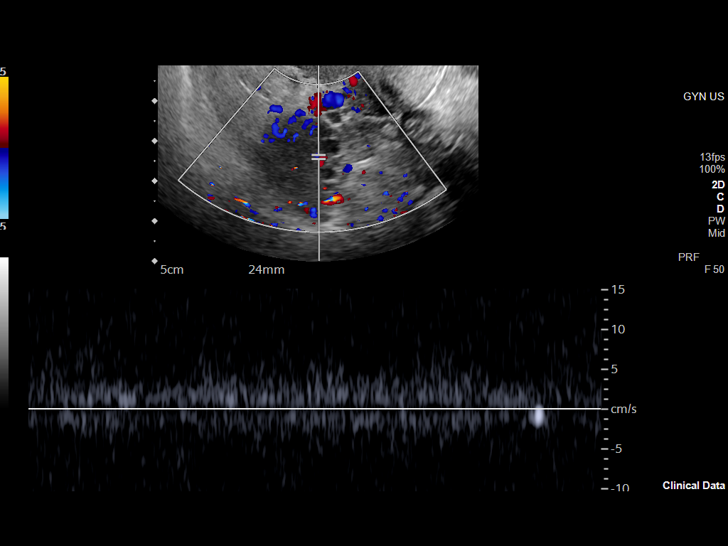
[im 56/61]
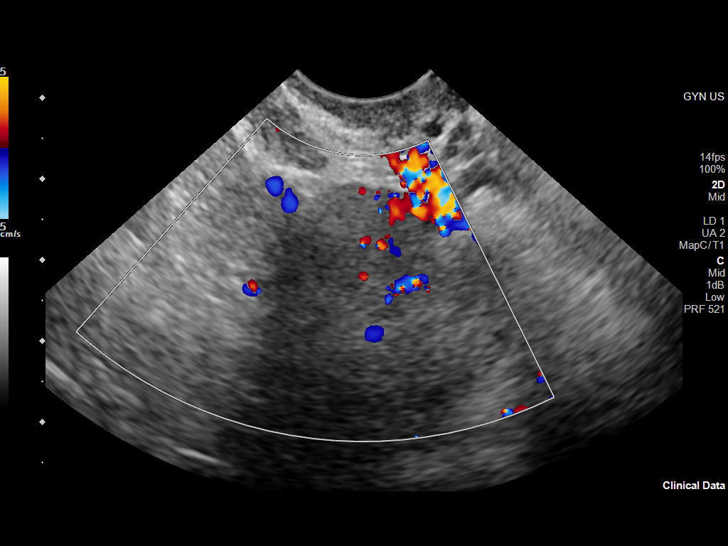
[im 61/61]
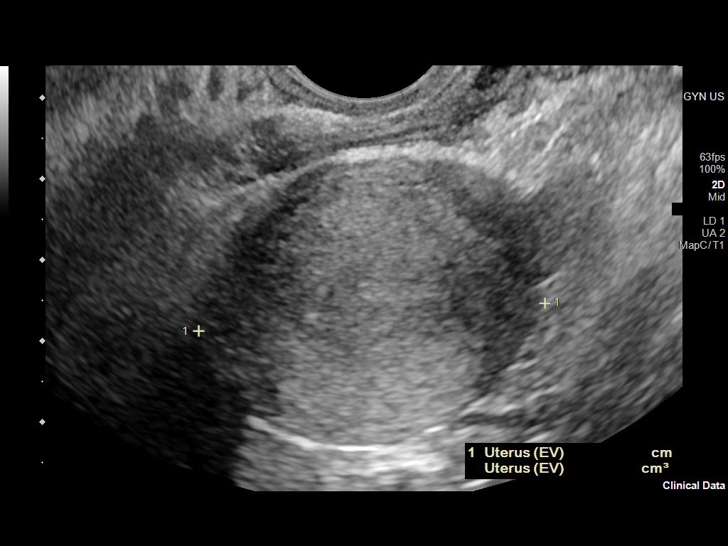

[13 of 25 positions shown; findings below may reference images not displayed]

FINDINGS: Uterus

Measurements: 7.0 cm x 3.3 cm x 4.3 cm = volume: 51.6 mL. No
fibroids or other mass visualized.

Endometrium

Thickness: 2.9 mm.  No focal abnormality visualized.

Right ovary

Measurements: 3.3 cm x 1.7 cm x 3.1 cm = volume: 9.3 mL. Normal
appearance/no adnexal mass.

Left ovary

Measurements: 3.3 cm x 2.9 cm x 2.8 cm = volume: 14.5 mL. Normal
appearance/no adnexal mass.

Pulsed Doppler evaluation of both ovaries demonstrates normal
low-resistance arterial and venous waveforms.

Other findings

A trace amount of pelvic free fluid is noted, likely physiologic.
IMPRESSION: Unremarkable pelvic ultrasound.

## 2022-10-29 ENCOUNTER — Ambulatory Visit (HOSPITAL_BASED_OUTPATIENT_CLINIC_OR_DEPARTMENT_OTHER): Payer: Medicaid Other | Admitting: Orthopaedic Surgery

## 2022-11-24 ENCOUNTER — Other Ambulatory Visit (HOSPITAL_BASED_OUTPATIENT_CLINIC_OR_DEPARTMENT_OTHER): Payer: Self-pay | Admitting: Nurse Practitioner

## 2022-11-24 DIAGNOSIS — L503 Dermatographic urticaria: Secondary | ICD-10-CM

## 2023-01-09 ENCOUNTER — Other Ambulatory Visit: Payer: Self-pay | Admitting: Nurse Practitioner

## 2023-01-09 DIAGNOSIS — L503 Dermatographic urticaria: Secondary | ICD-10-CM

## 2023-01-10 MED ORDER — LEVOCETIRIZINE DIHYDROCHLORIDE 5 MG PO TABS
5.0000 mg | ORAL_TABLET | Freq: Every evening | ORAL | 0 refills | Status: DC
Start: 2023-01-10 — End: 2023-03-07

## 2023-02-02 ENCOUNTER — Ambulatory Visit: Payer: Medicaid Other | Admitting: Psychiatry

## 2023-02-02 NOTE — Progress Notes (Deleted)
   CC:  headaches  Follow-up Visit  Last visit: 07/06/22  Brief HPI: 22 year old female with a history of GERD, ADD, anxiety, migraines who follows in clinic for right sided occipital neuralgia.   Interval History: Headaches***cymbalta***nerve block***   Headache days per month: *** Migraine days per month*** Headache free days per month: ***  Current Headache Regimen: Preventative: *** Abortive: ***   Prior Therapies                                  Maxalt 10 mg PRN Cymbalta 20 mg daily  Physical Exam:   Vital Signs: There were no vitals taken for this visit. GENERAL:  well appearing, in no acute distress, alert  SKIN:  Color, texture, turgor normal. No rashes or lesions HEAD:  Normocephalic/atraumatic. RESP: normal respiratory effort MSK:  No gross joint deformities.   NEUROLOGICAL: Mental Status: Alert, oriented to person, place and time, Follows commands, and Speech fluent and appropriate. Cranial Nerves: PERRL, face symmetric, no dysarthria, hearing grossly intact Motor: moves all extremities equally Gait: normal-based.  IMPRESSION: ***  PLAN: ***   Follow-up: ***  I spent a total of *** minutes on the date of the service. Headache education was done. Discussed lifestyle modification including increased oral hydration, decreased caffeine, exercise and stress management. Discussed treatment options including preventive and acute medications, natural supplements, and infusion therapy. Discussed medication overuse headache and to limit use of acute treatments to no more than 2 days/week or 10 days/month. Discussed medication side effects, adverse reactions and drug interactions. Written educational materials and patient instructions outlining all of the above were given.  Ocie Doyne, MD

## 2023-02-08 ENCOUNTER — Encounter: Payer: Self-pay | Admitting: Psychiatry

## 2023-02-08 ENCOUNTER — Other Ambulatory Visit: Payer: Self-pay | Admitting: Nurse Practitioner

## 2023-02-08 ENCOUNTER — Ambulatory Visit (INDEPENDENT_AMBULATORY_CARE_PROVIDER_SITE_OTHER): Payer: Medicaid Other | Admitting: Psychiatry

## 2023-02-08 ENCOUNTER — Ambulatory Visit: Payer: Medicaid Other | Admitting: Nurse Practitioner

## 2023-02-08 VITALS — BP 123/76 | HR 97 | Ht 60.0 in | Wt 202.0 lb

## 2023-02-08 DIAGNOSIS — M5481 Occipital neuralgia: Secondary | ICD-10-CM

## 2023-02-08 DIAGNOSIS — R110 Nausea: Secondary | ICD-10-CM | POA: Diagnosis not present

## 2023-02-08 DIAGNOSIS — N946 Dysmenorrhea, unspecified: Secondary | ICD-10-CM

## 2023-02-08 MED ORDER — ONDANSETRON HCL 8 MG PO TABS: 8.0000 mg | ORAL_TABLET | Freq: Three times a day (TID) | ORAL | 3 refills | Status: DC | PRN

## 2023-02-08 MED ORDER — DESVENLAFAXINE SUCCINATE ER 25 MG PO TB24: 25.0000 mg | ORAL_TABLET | Freq: Every day | ORAL | 6 refills | Status: DC

## 2023-02-08 NOTE — Progress Notes (Signed)
   CC:  headaches  Follow-up Visit  Last visit: 07/06/22  Brief HPI: 22 year old female with a history of GERD, ADD, anxiety, migraines who follows in clinic for right sided occipital neuralgia.   At her last visit Cymbalta was increased to 30 mg daily.  Interval History: Cymbalta helped, but she stopped taking it for a month due to forgetfulness. She just restarted it a few days ago, but it has been causing nausea and heartburn. She would like to know if there are any alternative medication options that would also help with anxiety/depression. Continues to have intermittent occipital headaches. They are very brief and remit before she would be able to take a rescue medication.  She has had trouble falling asleep and staying asleep as well. This had been going on before she restarted Cymbalta.  Current Headache Regimen: Preventative: Cymbalta 30 mg daily   Prior Therapies                                  Maxalt 10 mg PRN Cymbalta 30 mg daily  Physical Exam:   Vital Signs: BP 123/76 (BP Location: Right Arm, Patient Position: Sitting, Cuff Size: Normal)   Pulse 97   Ht 5' (1.524 m)   Wt 202 lb (91.6 kg)   BMI 39.45 kg/m  GENERAL:  well appearing, in no acute distress, alert  SKIN:  Color, texture, turgor normal. No rashes or lesions HEAD:  Normocephalic/atraumatic. RESP: normal respiratory effort MSK:  No gross joint deformities.   NEUROLOGICAL: Mental Status: Alert, oriented to person, place and time, Follows commands, and Speech fluent and appropriate. Cranial Nerves: PERRL, face symmetric, no dysarthria, hearing grossly intact Motor: moves all extremities equally Gait: normal-based.  IMPRESSION: 22 year old female with a history of GERD, ADD, anxiety, migraines who presents for follow up of occipital neuralgia. Cymbalta has helped but has been causing nausea. Will start Pristiq for headache prevention. She does not take a rescue as headache duration is very  short.  PLAN: -Prevention: Start Pristiq -Zofran 8 mg PRN for nausea -If Pristiq is too expensive, will plan to go back down to 20 mg Cymbalta for a week and then try to increase back to 30 mg daily. If she still has side effects with SNRIs could consider TCA such as nortriptyline for headache prevention   Follow-up: 6 months  I spent a total of 18 minutes on the date of the service. Headache education was done. Discussed treatment options including preventive medications and physical therapy. Discussed medication side effects, adverse reactions and drug interactions. Written educational materials and patient instructions outlining all of the above were given.  Ocie Doyne, MD 02/08/23 10:09 AM

## 2023-02-10 ENCOUNTER — Encounter: Payer: Self-pay | Admitting: Psychiatry

## 2023-02-14 ENCOUNTER — Other Ambulatory Visit: Payer: Self-pay | Admitting: Psychiatry

## 2023-02-14 MED ORDER — DULOXETINE HCL 20 MG PO CPEP: 20.0000 mg | ORAL_CAPSULE | Freq: Every day | ORAL | 0 refills | Status: DC

## 2023-02-21 ENCOUNTER — Encounter: Payer: Self-pay | Admitting: Nurse Practitioner

## 2023-02-21 ENCOUNTER — Ambulatory Visit: Payer: Medicaid Other | Admitting: Nurse Practitioner

## 2023-02-21 ENCOUNTER — Other Ambulatory Visit: Payer: Self-pay | Admitting: Nurse Practitioner

## 2023-02-21 VITALS — BP 120/76 | HR 71 | Wt 200.6 lb

## 2023-02-21 DIAGNOSIS — K219 Gastro-esophageal reflux disease without esophagitis: Secondary | ICD-10-CM

## 2023-02-21 DIAGNOSIS — F502 Bulimia nervosa, unspecified: Secondary | ICD-10-CM

## 2023-02-21 DIAGNOSIS — K582 Mixed irritable bowel syndrome: Secondary | ICD-10-CM

## 2023-02-21 DIAGNOSIS — R3915 Urgency of urination: Secondary | ICD-10-CM | POA: Insufficient documentation

## 2023-02-21 DIAGNOSIS — F339 Major depressive disorder, recurrent, unspecified: Secondary | ICD-10-CM | POA: Insufficient documentation

## 2023-02-21 DIAGNOSIS — F3181 Bipolar II disorder: Secondary | ICD-10-CM | POA: Insufficient documentation

## 2023-02-21 DIAGNOSIS — R7303 Prediabetes: Secondary | ICD-10-CM

## 2023-02-21 DIAGNOSIS — M25571 Pain in right ankle and joints of right foot: Secondary | ICD-10-CM

## 2023-02-21 DIAGNOSIS — M25562 Pain in left knee: Secondary | ICD-10-CM

## 2023-02-21 DIAGNOSIS — F988 Other specified behavioral and emotional disorders with onset usually occurring in childhood and adolescence: Secondary | ICD-10-CM | POA: Diagnosis not present

## 2023-02-21 DIAGNOSIS — L509 Urticaria, unspecified: Secondary | ICD-10-CM

## 2023-02-21 DIAGNOSIS — F5104 Psychophysiologic insomnia: Secondary | ICD-10-CM | POA: Insufficient documentation

## 2023-02-21 DIAGNOSIS — R638 Other symptoms and signs concerning food and fluid intake: Secondary | ICD-10-CM

## 2023-02-21 DIAGNOSIS — M2480 Other specific joint derangements of unspecified joint, not elsewhere classified: Secondary | ICD-10-CM | POA: Insufficient documentation

## 2023-02-21 DIAGNOSIS — E8889 Other specified metabolic disorders: Secondary | ICD-10-CM | POA: Insufficient documentation

## 2023-02-21 DIAGNOSIS — N39492 Postural (urinary) incontinence: Secondary | ICD-10-CM | POA: Insufficient documentation

## 2023-02-21 HISTORY — DX: Urgency of urination: R39.15

## 2023-02-21 MED ORDER — TRAZODONE HCL 50 MG PO TABS
25.0000 mg | ORAL_TABLET | Freq: Every evening | ORAL | 1 refills | Status: DC | PRN
Start: 2023-02-21 — End: 2023-08-24

## 2023-02-21 MED ORDER — ARMODAFINIL 50 MG PO TABS
50.0000 mg | ORAL_TABLET | Freq: Every day | ORAL | 0 refills | Status: DC | PRN
Start: 2023-02-21 — End: 2023-05-01

## 2023-02-21 MED ORDER — BLOOD GLUCOSE TEST STRIPS 333 VI STRP
ORAL_STRIP | 99 refills | Status: DC
Start: 2023-02-21 — End: 2023-02-22

## 2023-02-21 NOTE — Assessment & Plan Note (Signed)
Patient reports irregular bowel movements, alternating constipation and diarrhea, and significant discomfort associated with bowel movements. -Increase dietary fiber intake. -Consider referral to a gastroenterologist for further management.

## 2023-02-21 NOTE — Assessment & Plan Note (Signed)
Patient reports recurrent hives even while on Xyzal. -Continue Xyzal as prescribed. -Consider allergy testing to identify potential triggers.

## 2023-02-21 NOTE — Progress Notes (Signed)
Shawna Clamp, DNP, AGNP-c [facility]  [address] [place], [place] [address] [phone]  ESTABLISHED PATIENT- Chronic Health and/or Follow-Up Visit  Blood pressure 120/76, pulse 71, weight 200 lb 9.6 oz (91 kg).    Alyssa Bennett is a 22 y.o. year old female presenting today for evaluation and management of chronic conditions.   History of Present Illness Alyssa Bennett presents today with a complex array of symptoms.    She reports a history of ADHD and express concerns about the impact of medication on her appetite. She has previously been on Vyvanse but is hesitant to resume due to its appetite-suppressing effects. She also expresses interest in exploring options for managing disordered eating habits. She has a significant history of eating disorders and requests someone to speak to about this specific concern.   She has been experiencing whole body hives, even while on Xyzal, and these appear to occur in batches without a clear trigger. The hives are widespread, affecting areas from the feet to the head. The hives are pruritic and painful. They do not correlate with activity, food, medication, mood, etc. Nothing improves them but time.    She also reports joint laxity and frequent sprains, even from normal activities, which have been increasing in frequency.   The patient has been experiencing urinary symptoms, characterized by a lack of sensation to urinate but finding herself wet as if she had urinated. This occurs almost daily and is not associated with any pain.    She also reports ovarian pain and irregular bowel movements, alternating between constipation and diarrhea. The patient describes episodes of severe nausea and chills, which are relieved by bowel movements.  The patient also reports difficulty with sleep, despite being on levocetirizine. She expresses a desire for a medication that can help with sleep without causing next-day drowsiness.   The  patient has been trying to lose weight but reports difficulty despite being active and making dietary changes. She express frustration with fluctuations in weight and a lack of progress in weight loss. She also reports feeling nauseous when hungry. She has a history of pre-diabetes and is not currently on medication for this.   In summary, the patient presents with a complex array of symptoms, including ADHD, disordered eating, hives, joint laxity, urinary symptoms, gastrointestinal symptoms, sleep difficulties, and challenges with weight loss. She expresses a desire for effective management of these symptoms and are open to exploring different treatment options.  All ROS negative with exception of what is listed above.   PHYSICAL EXAM Physical Exam Vitals and nursing note reviewed.  Constitutional:      General: She is not in acute distress.    Appearance: She is not ill-appearing.  Eyes:     Conjunctiva/sclera: Conjunctivae normal.  Skin:    General: Skin is warm and dry.     Capillary Refill: Capillary refill takes less than 2 seconds.  Neurological:     General: No focal deficit present.     Mental Status: She is alert and oriented to person, place, and time.  Psychiatric:        Attention and Perception: Attention normal.        Mood and Affect: Affect is labile and tearful.        Speech: Speech normal.        Behavior: Behavior is cooperative.        Thought Content: Thought content normal.     PLAN Problem List Items Addressed This Visit  Attention deficit disorder (ADD) without hyperactivity    Patient has concerns about appetite suppression with Vyvanse. Discussed alternative non-stimulant medication, Strattera, unfortunately, this is not a good option with duloxetine. We can consider modafinil as an alternative as this tends to have less effects on appetite.  -Explore coverage for Modafinil as a potential alternative.      Relevant Orders   Ambulatory referral to  Psychiatry   IBS (irritable bowel syndrome)    Patient reports irregular bowel movements, alternating constipation and diarrhea, and significant discomfort associated with bowel movements. -Increase dietary fiber intake. -Consider referral to a gastroenterologist for further management.      Gastroesophageal reflux disease    Patient is currently on Nexium but still experiences nausea. -Continue Nexium as prescribed. -Recommend eating smaller, frequent snacks/meals throughout the day to prevent hunger nausea.       Full body hives    Patient reports recurrent hives even while on Xyzal. -Continue Xyzal as prescribed. -Consider allergy testing to identify potential triggers.      Relevant Orders   Ambulatory referral to Allergy   Hemoglobin A1c   CBC with Differential/Platelet   Comprehensive metabolic panel   TSH   VITAMIN D 25 Hydroxy (Vit-D Deficiency, Fractures)   Bulimia nervosa    Patient reports difficulty with appetite cues and has a history of seeing a nutritionist for prediabetes. Expressed interest in seeing a specialist for disordered eating habits. -Refer to a specialist for evaluation and management of disordered eating habits.       Relevant Medications   Glucose Blood (BLOOD GLUCOSE TEST STRIPS 333) STRP   traZODone (DESYREL) 50 MG tablet   Other Relevant Orders   Ambulatory referral to Psychiatry   Hemoglobin A1c   CBC with Differential/Platelet   Comprehensive metabolic panel   TSH   VITAMIN D 25 Hydroxy (Vit-D Deficiency, Fractures)   Acute pain of left knee   Relevant Orders   Ambulatory referral to Physical Therapy   Acute right ankle pain   Relevant Orders   Ambulatory referral to Physical Therapy   Hoffa's fat pad disease (HCC)   Depression, recurrent (HCC)   Relevant Medications   traZODone (DESYREL) 50 MG tablet   Other Relevant Orders   Ambulatory referral to Psychiatry   Hemoglobin A1c   CBC with Differential/Platelet   Comprehensive  metabolic panel   TSH   VITAMIN D 25 Hydroxy (Vit-D Deficiency, Fractures)   Postural urinary incontinence    Patient reports episodes of urinary incontinence without sensation of needing to urinate. -Refer to a urogynecologist for further evaluation and management.      Relevant Orders   Ambulatory referral to Urogynecology   Urinary urgency    Patient reports episodes of urinary incontinence without sensation of needing to urinate. -Refer to a urogynecologist for further evaluation and management.      Relevant Orders   Ambulatory referral to Urogynecology   Generalized hypermobility of joints    Patient reports frequent sprains and joint pain. Discussed potential Ehlers-Danlos syndrome. Discussed with patient that we do not have any providers in the area that see patients for this concern and have been educated to avoid extensive testing or referrals, but rather treat symptoms.  -Recommend physical therapy. -Encourage patient to stay active and well-hydrated. -Treatment limited to symptom management      Relevant Orders   Ambulatory referral to Physical Therapy   Hemoglobin A1c   CBC with Differential/Platelet   Comprehensive metabolic panel   TSH  VITAMIN D 25 Hydroxy (Vit-D Deficiency, Fractures)   Psychophysiological insomnia    Patient reports significant anxiety, which may be exacerbating other symptoms. -Continue Duloxetine as prescribed. -Consider adding Trazodone to help with sleep.      Relevant Medications   traZODone (DESYREL) 50 MG tablet   Pre-diabetes - Primary   Relevant Medications   Glucose Blood (BLOOD GLUCOSE TEST STRIPS 333) STRP   Other Relevant Orders   Hemoglobin A1c   CBC with Differential/Platelet   Comprehensive metabolic panel   TSH   VITAMIN D 25 Hydroxy (Vit-D Deficiency, Fractures)   Insulin, Free and Total   Other Visit Diagnoses     Weight disorder       Relevant Orders   Testosterone, Total, LC/MS/MS   DHEA-sulfate    Insulin, Free and Total       Return in about 14 weeks (around 05/30/2023) for ADHD, IBS.  Time: 49 minutes, >50% spent counseling, care coordination, chart review, and documentation.   Shawna Clamp, DNP, AGNP-c

## 2023-02-21 NOTE — Patient Instructions (Addendum)
Irritable Bowel Syndrome, Adult  Irritable bowel syndrome (IBS) is a group of symptoms that affects the organs responsible for digestion (gastrointestinal tract, or GI tract). IBS is not one specific disease. To regulate how the GI tract works, the body sends signals back and forth between the intestines and the brain. If you have IBS, there may be a problem with these signals. As a result, the GI tract does not function normally. The intestines may become more sensitive and overreact to certain things. This may be especially true when you eat certain foods or when you are under stress. There are four main types of IBS. These may be determined based on the consistency of your stool (feces): IBS with mostly (predominance of) diarrhea. IBS with predominance of constipation. IBS with mixed bowel habits. This includes both diarrhea and constipation. IBS unclassified. This includes IBS that cannot be categorized into one of the other three main types. It is important to know which type of IBS you have. Certain treatments are more likely to be helpful for certain types of IBS. What are the causes? The exact cause of IBS is not known. What increases the risk? You may have a higher risk for IBS if you: Are female. Are younger than 40 years. Have a family history of IBS. Have a mental health condition, such as depression, anxiety, or post-traumatic stress disorder. Have had a bacterial infection of your GI tract. What are the signs or symptoms? Symptoms of IBS vary from person to person. The main symptom is abdominal pain or discomfort. Other symptoms usually include one or more of the following: Diarrhea, constipation, or both. Swelling or bloating in the abdomen. Feeling full after eating a small or regular-sized meal. Frequent gas. Mucus in the stool. A feeling of having more stool left after a bowel movement. Symptoms tend to come and go. They may be triggered by stress, mental health  conditions, or certain foods. How is this diagnosed? This condition may be diagnosed based on a physical exam, your medical history, and your symptoms. You may have tests, such as: Blood tests. Stool test. Colonoscopy. This is a procedure in which your GI tract is viewed with a long, thin, flexible tube. How is this treated? There is no cure for IBS, but treatment can help relieve symptoms. Treatment depends on the type of IBS you have, and may include: Changes to your diet, such as: Avoiding foods that cause symptoms. Drinking more water. Following a low-FODMAP (fermentable oligosaccharides, disaccharides, monosaccharides, and polyols) diet for up to 6 weeks, or as told by your health care provider. FODMAPs are sugars that are hard for some people to digest. Eating more fiber. Eating small meals at the same times every day. Medicines. These may include: Fiber supplements, if you have constipation. Medicine to control diarrhea (antidiarrheal medicines). Medicine to help control muscle tightening (spasms) in your GI tract (antispasmodic medicines). Medicines to help with mental health conditions, such as antidepressants. Talk therapy or counseling. Working with a dietitian to help create a food plan that is right for you. Managing your stress. Follow these instructions at home: Eating and drinking  Eat a healthy diet. Eat 5-6 small meals a day. Try to eat meals at about the same times each day. Do not eat large meals. Gradually eat more fiber-rich foods. These include whole grains, fruits, and vegetables. This may be especially helpful if you have IBS with constipation. Eat a diet low in FODMAPs. You may need to avoid foods such as  citrus fruits, cabbage, garlic, and onions. Drink enough fluid to keep your urine pale yellow. Keep a journal of foods that seem to trigger symptoms. Avoid foods and drinks that: Contain added sugar. Make your symptoms worse. These may include dairy  products, caffeinated drinks, and carbonated drinks. Alcohol use Do not drink alcohol if: Your health care provider tells you not to drink. You are pregnant, may be pregnant, or are planning to become pregnant. If you drink alcohol: Limit how much you have to: 0-1 drink a day for women. 0-2 drinks a day for men. Know how much alcohol is in your drink. In the [place] one drink equals one 12 oz bottle of beer (355 mL), one 5 oz glass of wine (148 mL), or one 1 oz glass of hard liquor (44 mL) General instructions Take over-the-counter and prescription medicines only as told by your health care provider. This includes supplements. Get enough exercise. Do at least 150 minutes of moderate-intensity exercise each week. Manage your stress. Getting enough sleep and exercise can help you manage stress. Keep all follow-up visits. This is important. This includes all visits with your health care provider and therapist. Where to find more information International Foundation for Functional Gastrointestinal Disorders: aboutibs.[employer] of Diabetes and Digestive and Kidney Diseases: [employer]si Contact a health care provider if: You have constant pain. You lose weight. You have diarrhea that gets worse. You have bleeding from the rectum. You vomit often. You have a fever. Get help right away if: You have severe abdominal pain. You have diarrhea with symptoms of dehydration, such as dizziness or dry mouth. You have bloody or black stools. You have severe abdominal bloating. You have vomiting that does not stop. You have blood in your vomit. Summary Irritable bowel syndrome (IBS) is not one specific disease. It is a group of symptoms that affects digestion. Your intestines may become more sensitive and overreact to certain things. This may be especially true when you eat certain foods or when you are under stress. There is no cure for IBS, but treatment can help relieve  symptoms. This information is not intended to replace advice given to you by your health care provider. Make sure you discuss any questions you have with your health care provider. Document Revised: 04/22/2021 Document Reviewed: 04/22/2021 Elsevier Patient Education  2024 Elsevier Inc.   Low-FODMAP Eating Plan  FODMAP stands for fermentable oligosaccharides, disaccharides, monosaccharides, and polyols. These are sugars that are hard for some people to digest. A low-FODMAP eating plan may help some people who have irritable bowel syndrome (IBS) and certain other bowel (intestinal) diseases to manage their symptoms. This meal plan can be complicated to follow. Work with a diet and nutrition specialist (dietitian) to make a low-FODMAP eating plan that is right for you. A dietitian can help make sure that you get enough nutrition from this diet. What are tips for following this plan? Reading food labels Check labels for hidden FODMAPs such as: High-fructose syrup. Honey. Agave. Natural fruit flavors. Onion or garlic powder. Choose low-FODMAP foods that contain 3-4 grams of fiber per serving. Check food labels for serving sizes. Eat only one serving at a time to make sure FODMAP levels stay low. Sho[employer] Shop with a list of foods that are recommended on this diet and make a meal plan. Meal planning Follow a low-FODMAP eating plan for up to 6 weeks, or as told by your health care provider or dietitian. To follow the eating plan: Eliminate high-FODMAP foods  from your diet completely. Choose only low-FODMAP foods to eat. You will do this for 2-6 weeks. Gradually reintroduce high-FODMAP foods into your diet one at a time. Most people should wait a few days before introducing the next new high-FODMAP food into their meal plan. Your dietitian can recommend how quickly you may reintroduce foods. Keep a daily record of what and how much you eat and drink. Make note of any symptoms that you have after  eating. Review your daily record with a dietitian regularly to identify which foods you can eat and which foods you should avoid. General tips Drink enough fluid each day to keep your urine pale yellow. Avoid processed foods. These often have added sugar and may be high in FODMAPs. Avoid most dairy products, whole grains, and sweeteners. Work with a dietitian to make sure you get enough fiber in your diet. Avoid high FODMAP foods at meals to manage symptoms. Recommended foods Fruits Bananas, oranges, tangerines, lemons, limes, blueberries, raspberries, strawberries, grapes, cantaloupe, honeydew melon, kiwi, papaya, passion fruit, and pineapple. Limited amounts of dried cranberries, banana chips, and shredded coconut. Vegetables Eggplant, zucchini, cucumber, peppers, green beans, bean sprouts, lettuce, arugula, kale, Swiss chard, spinach, collard greens, bok choy, summer squash, potato, and tomato. Limited amounts of corn, carrot, and sweet potato. Green parts of scallions. Grains Gluten-free grains, such as rice, oats, buckwheat, quinoa, corn, polenta, and millet. Gluten-free pasta, bread, or cereal. Rice noodles. Corn tortillas. Meats and other proteins Unseasoned beef, pork, poultry, or fish. Eggs. Tomasa Blase. Tofu (firm) and tempeh. Limited amounts of nuts and seeds, such as almonds, walnuts, [place] nuts, pecans, peanuts, nut butters, pumpkin seeds, chia seeds, and sunflower seeds. Dairy Lactose-free milk, yogurt, and kefir. Lactose-free cottage cheese and ice cream. Non-dairy milks, such as almond, coconut, hemp, and rice milk. Non-dairy yogurt. Limited amounts of goat cheese, brie, mozzarella, parmesan, swiss, and other hard cheeses. Fats and oils Butter-free spreads. Vegetable oils, such as olive, canola, and sunflower oil. Seasoning and other foods Artificial sweeteners with names that do not end in "ol," such as aspartame, saccharine, and stevia. Maple syrup, white table sugar, raw sugar,  brown sugar, and molasses. Mayonnaise, soy sauce, and tamari. Fresh basil, coriander, parsley, rosemary, and thyme. Beverages Water and mineral water. Sugar-sweetened soft drinks. Small amounts of orange juice or cranberry juice. Black and green tea. Most dry wines. Coffee. The items listed above may not be a complete list of foods and beverages you can eat. Contact a dietitian for more information. Foods to avoid Fruits Fresh, dried, and juiced forms of apple, pear, watermelon, peach, plum, cherries, apricots, blackberries, boysenberries, figs, nectarines, and mango. Avocado. Vegetables Chicory root, artichoke, asparagus, cabbage, snow peas, Brussels sprouts, broccoli, sugar snap peas, mushrooms, celery, and cauliflower. Onions, garlic, leeks, and the white part of scallions. Grains Wheat, including kamut, durum, and semolina. Barley and bulgur. Couscous. Wheat-based cereals. Wheat noodles, bread, crackers, and pastries. Meats and other proteins Fried or fatty meat. Sausage. Cashews and pistachios. Soybeans, baked beans, black beans, chickpeas, kidney beans, fava beans, navy beans, lentils, black-eyed peas, and split peas. Dairy Milk, yogurt, ice cream, and soft cheese. Cream and sour cream. Milk-based sauces. Custard. Buttermilk. Soy milk. Seasoning and other foods Any sugar-free gum or candy. Foods that contain artificial sweeteners such as sorbitol, mannitol, isomalt, or xylitol. Foods that contain honey, high-fructose corn syrup, or agave. Bouillon, vegetable stock, beef stock, and chicken stock. Garlic and onion powder. Condiments made with onion, such as hummus, chutney, pickles, relish, salad  dressing, and salsa. Tomato paste. Beverages Chicory-based drinks. Coffee substitutes. Chamomile tea. Fennel tea. Sweet or fortified wines such as port or sherry. Diet soft drinks made with isomalt, mannitol, maltitol, sorbitol, or xylitol. Apple, pear, and mango juice. Juices with high-fructose corn  syrup. The items listed above may not be a complete list of foods and beverages you should avoid. Contact a dietitian for more information. Summary FODMAP stands for fermentable oligosaccharides, disaccharides, monosaccharides, and polyols. These are sugars that are hard for some people to digest. A low-FODMAP eating plan is a short-term diet that helps to ease symptoms of certain bowel diseases. The eating plan usually lasts up to 6 weeks. After that, high-FODMAP foods are reintroduced gradually and one at a time. This can help you find out which foods may be causing symptoms. A low-FODMAP eating plan can be complicated. It is best to work with a dietitian who has experience with this type of plan. This information is not intended to replace advice given to you by your health care provider. Make sure you discuss any questions you have with your health care provider. Document Revised: 09/27/2019 Document Reviewed: 09/27/2019 Elsevier Patient Education  2024 [employer].

## 2023-02-21 NOTE — Assessment & Plan Note (Signed)
Patient reports significant anxiety, which may be exacerbating other symptoms. -Continue Duloxetine as prescribed. -Consider adding Trazodone to help with sleep.

## 2023-02-21 NOTE — Assessment & Plan Note (Signed)
Patient is currently on Nexium but still experiences nausea. -Continue Nexium as prescribed. -Recommend eating smaller, frequent snacks/meals throughout the day to prevent hunger nausea.

## 2023-02-21 NOTE — Assessment & Plan Note (Signed)
Patient has concerns about appetite suppression with Vyvanse. Discussed alternative non-stimulant medication, Strattera, unfortunately, this is not a good option with duloxetine. We can consider modafinil as an alternative as this tends to have less effects on appetite.  -Explore coverage for Modafinil as a potential alternative.

## 2023-02-21 NOTE — Assessment & Plan Note (Signed)
Patient reports difficulty with appetite cues and has a history of seeing a nutritionist for prediabetes. Expressed interest in seeing a specialist for disordered eating habits. -Refer to a specialist for evaluation and management of disordered eating habits.

## 2023-02-21 NOTE — Assessment & Plan Note (Signed)
Patient reports episodes of urinary incontinence without sensation of needing to urinate. -Refer to a urogynecologist for further evaluation and management.

## 2023-02-21 NOTE — Assessment & Plan Note (Signed)
Patient reports frequent sprains and joint pain. Discussed potential Ehlers-Danlos syndrome. Discussed with patient that we do not have any providers in the area that see patients for this concern and have been educated to avoid extensive testing or referrals, but rather treat symptoms.  -Recommend physical therapy. -Encourage patient to stay active and well-hydrated. -Treatment limited to symptom management

## 2023-02-22 ENCOUNTER — Emergency Department (HOSPITAL_COMMUNITY): Payer: Medicaid Other

## 2023-02-22 ENCOUNTER — Encounter (HOSPITAL_BASED_OUTPATIENT_CLINIC_OR_DEPARTMENT_OTHER): Payer: Self-pay | Admitting: Emergency Medicine

## 2023-02-22 ENCOUNTER — Emergency Department (HOSPITAL_BASED_OUTPATIENT_CLINIC_OR_DEPARTMENT_OTHER)
Admission: EM | Admit: 2023-02-22 | Discharge: 2023-02-22 | Disposition: A | Discharge status: Home / Self Care | Payer: Medicaid Other | Attending: Emergency Medicine | Admitting: Emergency Medicine

## 2023-02-22 ENCOUNTER — Other Ambulatory Visit: Payer: Self-pay

## 2023-02-22 DIAGNOSIS — R1032 Left lower quadrant pain: Secondary | ICD-10-CM | POA: Diagnosis not present

## 2023-02-22 DIAGNOSIS — R1030 Lower abdominal pain, unspecified: Secondary | ICD-10-CM | POA: Diagnosis present

## 2023-02-22 DIAGNOSIS — R739 Hyperglycemia, unspecified: Secondary | ICD-10-CM | POA: Insufficient documentation

## 2023-02-22 DIAGNOSIS — Z8742 Personal history of other diseases of the female genital tract: Secondary | ICD-10-CM | POA: Insufficient documentation

## 2023-02-22 DIAGNOSIS — N3001 Acute cystitis with hematuria: Secondary | ICD-10-CM

## 2023-02-22 DIAGNOSIS — R1031 Right lower quadrant pain: Secondary | ICD-10-CM | POA: Insufficient documentation

## 2023-02-22 DIAGNOSIS — N83201 Unspecified ovarian cyst, right side: Secondary | ICD-10-CM

## 2023-02-22 HISTORY — DX: Unspecified ovarian cyst, unspecified side: N83.209

## 2023-02-22 LAB — COMPREHENSIVE METABOLIC PANEL
ALT: 45 U/L — ABNORMAL HIGH (ref 0–44)
AST: 36 U/L (ref 15–41)
Albumin: 4.3 g/dL (ref 3.5–5.0)
Alkaline Phosphatase: 77 U/L (ref 38–126)
Anion gap: 11 (ref 5–15)
BUN: 11 mg/dL (ref 6–20)
CO2: 25 mmol/L (ref 22–32)
Calcium: 9.3 mg/dL (ref 8.9–10.3)
Chloride: 100 mmol/L (ref 98–111)
Creatinine, Ser: 0.49 mg/dL (ref 0.44–1.00)
GFR, Estimated: >60 mL/min (ref >60)
Glucose, Bld: 218 mg/dL — ABNORMAL HIGH (ref 70–99)
Potassium: 3.5 mmol/L (ref 3.5–5.1)
Sodium: 136 mmol/L (ref 135–145)
Total Bilirubin: 0.4 mg/dL (ref 0.3–1.2)
Total Protein: 7.6 g/dL (ref 6.5–8.1)

## 2023-02-22 LAB — CBC WITH DIFFERENTIAL/PLATELET
Abs Immature Granulocytes: 0.04 10*3/uL (ref 0.00–0.07)
Basophils Absolute: 0.1 10*3/uL (ref 0.0–0.1)
Basophils Relative: 0 %
Eosinophils Absolute: 0.3 10*3/uL (ref 0.0–0.5)
Eosinophils Relative: 2 %
HCT: 41.2 % (ref 36.0–46.0)
Hemoglobin: 14.4 g/dL (ref 12.0–15.0)
Immature Granulocytes: 0 %
Lymphocytes Relative: 20 %
Lymphs Abs: 2.7 10*3/uL (ref 0.7–4.0)
MCH: 30.1 pg (ref 26.0–34.0)
MCHC: 35 g/dL (ref 30.0–36.0)
MCV: 86 fL (ref 80.0–100.0)
Monocytes Absolute: 0.7 10*3/uL (ref 0.1–1.0)
Monocytes Relative: 5 %
Neutro Abs: 9.9 10*3/uL — ABNORMAL HIGH (ref 1.7–7.7)
Neutrophils Relative %: 73 %
Platelets: 293 10*3/uL (ref 150–400)
RBC: 4.79 MIL/uL (ref 3.87–5.11)
RDW: 12.1 % (ref 11.5–15.5)
WBC: 13.5 10*3/uL — ABNORMAL HIGH (ref 4.0–10.5)
nRBC: 0 % (ref 0.0–0.2)

## 2023-02-22 LAB — URINALYSIS, ROUTINE W REFLEX MICROSCOPIC
Bilirubin Urine: NEGATIVE
Glucose, UA: >1000 mg/dL — AB
Nitrite: NEGATIVE
Protein, ur: 30 mg/dL — AB
RBC / HPF: >50 RBC/hpf (ref 0–5)
Specific Gravity, Urine: 1.02 (ref 1.005–1.030)
WBC, UA: >50 WBC/hpf (ref 0–5)
pH: 6 (ref 5.0–8.0)

## 2023-02-22 LAB — PREGNANCY, URINE: Preg Test, Ur: NEGATIVE

## 2023-02-22 MED ORDER — HYDROCODONE-ACETAMINOPHEN 5-325 MG PO TABS: 1.0000 | ORAL_TABLET | Freq: Four times a day (QID) | ORAL | 0 refills | Status: DC | PRN

## 2023-02-22 MED ORDER — FENTANYL CITRATE PF 50 MCG/ML IJ SOSY
50.0000 ug | PREFILLED_SYRINGE | Freq: Once | INTRAMUSCULAR | Status: AC
  Administered 2023-02-22: 50 ug via INTRAVENOUS
  Filled 2023-02-22: qty 1

## 2023-02-22 MED ORDER — SODIUM CHLORIDE 0.9 % IV SOLN
12.5000 mg | Freq: Once | INTRAVENOUS | Status: AC
  Administered 2023-02-22: 12.5 mg via INTRAVENOUS
  Filled 2023-02-22: qty 12.5

## 2023-02-22 MED ORDER — MORPHINE SULFATE (PF) 4 MG/ML IV SOLN
4.0000 mg | Freq: Once | INTRAVENOUS | Status: AC
  Administered 2023-02-22: 4 mg via INTRAVENOUS
  Filled 2023-02-22: qty 1

## 2023-02-22 MED ORDER — CEPHALEXIN 250 MG PO CAPS
500.0000 mg | ORAL_CAPSULE | Freq: Once | ORAL | Status: AC
  Administered 2023-02-22: 500 mg via ORAL
  Filled 2023-02-22: qty 2

## 2023-02-22 MED ORDER — CEPHALEXIN 500 MG PO CAPS: 500.0000 mg | ORAL_CAPSULE | Freq: Two times a day (BID) | ORAL | 0 refills | Status: AC

## 2023-02-22 MED ORDER — ONDANSETRON HCL 4 MG/2ML IJ SOLN
4.0000 mg | Freq: Once | INTRAMUSCULAR | Status: AC
  Administered 2023-02-22: 4 mg via INTRAVENOUS
  Filled 2023-02-22: qty 2

## 2023-02-22 NOTE — ED Notes (Signed)
Provider notified that pt is asking for pain and nausea med. Hx of torsion in the past with no surgery done. Pt states, "It just resolved itself." Also states she recently got dx with IBS. Will continue to monitor. Waiting for new orders.

## 2023-02-22 NOTE — ED Provider Notes (Signed)
[place] EMERGENCY DEPARTMENT AT [facility] Provider Note   CSN: [id number] Arrival date & time: 02/22/23  1401     History  Chief Complaint  Patient presents with   Abdominal Cramping    Alyssa Bennett is a 22 y.o. female with past medical history significant for IBS, hemorrhagic ovarian cyst, ADHD, presents to the ED complaining of severe lower abdominal cramping and pain that began around noon today.  Patient states she did start her menstrual cycle and expect some cramping, but she began to have severe pain and is concerned for ovarian torsion.  She reports that she has a large right-sided ovarian cyst.  Denies previous abdominal surgeries.  Patient also has frequent diarrhea.  Denies fever, dysuria, pelvic pain, vaginal discharge.       Home Medications Prior to Admission medications   Medication Sig Start Date End Date Taking? Authorizing Provider  Armodafinil (NUVIGIL) 50 MG tablet Take 1 tablet (50 mg total) by mouth daily as needed (ADHD). Take in morning. 02/21/23   Tollie Eth, NP  DULoxetine (CYMBALTA) 20 MG capsule Take 1 capsule (20 mg total) by mouth daily. Take for 1 week, then increase to one 30 mg capsule daily 02/14/23   Ocie Doyne, MD  esomeprazole (NEXIUM) 40 MG capsule One tab by mouth at dinner time. 02/02/22   Tollie Eth, NP  glucose blood (ACCU-CHEK GUIDE) test strip Check once daily 02/22/23   Early, [name], NP  levocetirizine (XYZAL) 5 MG tablet Take 1 tablet (5 mg total) by mouth every evening. 01/10/23   Tollie Eth, NP  norethindrone-ethinyl estradiol-FE (JUNEL FE 1/20) 1-20 MG-MCG tablet TAKE 1 TABLET EVERY DAY 02/08/23   Early, [name], NP  ondansetron (ZOFRAN) 8 MG tablet Take 1 tablet (8 mg total) by mouth every 8 (eight) hours as needed for nausea or vomiting. 02/08/23   Ocie Doyne, MD  rizatriptan (MAXALT) 10 MG tablet Take 1 tablet (10 mg total) by mouth as needed for migraine. May repeat in 2 hours if needed 11/02/21   Early,  [name], NP  traZODone (DESYREL) 50 MG tablet Take 0.5-2 tablets (25-100 mg total) by mouth at bedtime as needed for sleep. 02/21/23   Tollie Eth, NP  Vitamin D, Ergocalciferol, (DRISDOL) 1.25 MG (50000 UNIT) CAPS capsule TAKE 1 CAPSULE EVERY 7 (SEVEN) DAYS. TAKE FOR 12 TOTAL DOSES THAN CAN TRANSITION TO 1000IU OTC DAILY 01/26/22   Early, [name], NP      Allergies    Zithromax [azithromycin]    Review of Systems   Review of Systems  Constitutional:  Negative for fever.  Gastrointestinal:  Positive for abdominal pain, diarrhea and nausea. Negative for vomiting.  Genitourinary:  Positive for vaginal bleeding. Negative for dysuria, pelvic pain and vaginal discharge.  Musculoskeletal:  Positive for back pain.    Physical Exam Updated Vital Signs BP 139/74 (BP Location: Right Arm)   Pulse 92   Temp 98.2 F (36.8 C) (Oral)   Resp 20   Ht 5' (1.524 m)   Wt 90.7 kg   LMP 02/21/2023   SpO2 98%   BMI 39.06 kg/m  Physical Exam Vitals and nursing note reviewed.  Constitutional:      General: She is not in acute distress.    Appearance: Normal appearance. She is not ill-appearing or diaphoretic.     Comments: Patient is tearful and appears very uncomfortable.  Cardiovascular:     Rate and Rhythm: Normal rate and regular rhythm.  Pulmonary:     Effort: Pulmonary effort is normal.  Abdominal:     General: Abdomen is flat. Bowel sounds are normal.     Palpations: Abdomen is soft.     Tenderness: There is abdominal tenderness in the right lower quadrant, suprapubic area and left lower quadrant. There is guarding (voluntary). There is no rebound. Negative signs include McBurney's sign.  Skin:    General: Skin is warm and dry.     Capillary Refill: Capillary refill takes less than 2 seconds.  Neurological:     Mental Status: She is alert. Mental status is at baseline.  Psychiatric:        Mood and Affect: Mood normal.        Behavior: Behavior normal.     ED Results / Procedures /  Treatments   Labs (all labs ordered are listed, but only abnormal results are displayed) Labs Reviewed  URINALYSIS, ROUTINE W REFLEX MICROSCOPIC - Abnormal; Notable for the following components:      Result Value   Color, Urine BROWN (*)    APPearance HAZY (*)    Glucose, UA >1,000 (*)    Hgb urine dipstick LARGE (*)    Ketones, ur TRACE (*)    Protein, ur 30 (*)    Leukocytes,Ua MODERATE (*)    Bacteria, UA RARE (*)    All other components within normal limits  CBC WITH DIFFERENTIAL/PLATELET - Abnormal; Notable for the following components:   WBC 13.5 (*)    Neutro Abs 9.9 (*)    All other components within normal limits  COMPREHENSIVE METABOLIC PANEL - Abnormal; Notable for the following components:   Glucose, Bld 218 (*)    ALT 45 (*)    All other components within normal limits  PREGNANCY, URINE    EKG None  Radiology No results found.  Procedures Procedures    Medications Ordered in ED Medications  ondansetron (ZOFRAN) injection 4 mg (4 mg Intravenous Given 02/22/23 1518)  morphine (PF) 4 MG/ML injection 4 mg (4 mg Intravenous Given 02/22/23 1519)    ED Course/ Medical Decision Making/ A&P                                 Medical Decision Making Amount and/or Complexity of Data Reviewed Labs: ordered. Radiology: ordered.  Risk Prescription drug management.   This patient presents to the ED with chief complaint(s) of sudden onset lower abdominal pain with pertinent past medical history of ovarian cyst.  The complaint involves an extensive differential diagnosis and also carries with it a high risk of complications and morbidity.    The differential diagnosis includes ovarian torsion, ovarian cyst, ruptured cyst, appendicitis   Records reviewed: Patient was seen by primary care yesterday.  She did have labs obtained at this time and did have leukocytosis.  The initial plan is to obtain UA, labs  Initial Assessment:   Exam significant for a tearful  uncomfortable appearing patient.  Abdomen is soft with tenderness to both lower quadrants and suprapubic region.  R > L abdominal tenderness.  No overlying skin changes or appreciable hernias.  No tenderness to the upper abdomen.  She does report nausea with palpation of the abdomen.    Independent ECG/labs interpretation:  The following labs were independently interpreted:  CBC with leukocytosis, is increased from yesterday.  No anemia.  Metabolic panel with hyperglycemia.  No other major disturbance.  UA significant for  glucosuria, leukocytes, and hemoglobin.  Treatment and Reassessment: Patient given Zofran and morphine with improvement in symptoms.  Plan is to obtain pelvic ultrasound to rule out ovarian torsion.  Unfortunately, unable to be done at [facility] due to equipment malfunction.  Patient will be transferred to [facility] ED.  Disposition:   Patient to go POV to [facility] to have ultrasound completed and any remaining workup completed.  Dr. Estanislado Pandy accepting.           Final Clinical Impression(s) / ED Diagnoses Final diagnoses:  Lower abdominal pain  History of ovarian cyst    Rx / DC Orders ED Discharge Orders     None         Lenard Simmer, PA-C 02/22/23 1617    Anders Simmonds T, DO 02/28/23 2009

## 2023-02-22 NOTE — ED Notes (Signed)
Pt. Per MD to go POV to [facility] for transvaginal US. Pt. Has IV intact and secured prior to departure.

## 2023-02-22 NOTE — Discharge Instructions (Addendum)
You were seen in the emergency department for abdominal pain.  As we discussed your ultrasound showed that you have a 2.4 cm cyst on your right ovary.  There was no evidence of torsion.  Your urinalysis did show some findings of a urinary tract infection, so sent some antibiotics to your pharmacy.  We have given you the first dose in the ER.  I recommend following up with a urogynecologist as you have been referred.  They can talk about some of your ongoing symptoms and possible management of your cyst.  I have also sent a prescription for some pain medication that you can take as needed.  Continue to monitor how you're doing and return to the ER for new or worsening symptoms.

## 2023-02-22 NOTE — ED Triage Notes (Signed)
Severe lower abdomen cramping/ pain.  Started this morning but intense and constant since 1:30pm Without relief.  Currently menstruating. Hx torsion and hemorrhagic cysts

## 2023-02-22 NOTE — ED Notes (Addendum)
Pt. Up to [facility].

## 2023-02-22 NOTE — ED Provider Notes (Signed)
Pt sent from [facility] ED for pelvic ultrasound to rule out ovarian torsion. Initially presented for severe lower abdominal cramping starting at 1200 today. [facility] ED unable to perform necessary imaging due to equipment malfunction. Ultrasound orders already placed.   Physical Exam  BP 119/67   Pulse 92   Temp 98.2 F (36.8 C) (Oral)   Resp 14   Ht 5' (1.524 m)   Wt 90.7 kg   LMP 02/21/2023   SpO2 100%   BMI 39.06 kg/m   Physical Exam Vitals and nursing note reviewed.  Constitutional:      Appearance: Normal appearance.  HENT:     Head: Normocephalic and atraumatic.  Eyes:     Conjunctiva/sclera: Conjunctivae normal.  Pulmonary:     Effort: Pulmonary effort is normal. No respiratory distress.  Skin:    General: Skin is warm and dry.  Neurological:     Mental Status: She is alert.  Psychiatric:        Mood and Affect: Mood normal.        Behavior: Behavior normal.    Results   Results for orders placed or performed during the hospital encounter of 02/22/23  Urinalysis, Routine w reflex microscopic -Urine, Clean Catch  Result Value Ref Range   Color, Urine BROWN (A) YELLOW   APPearance HAZY (A) CLEAR   Specific Gravity, Urine 1.020 1.005 - 1.030   pH 6.0 5.0 - 8.0   Glucose, UA >1,000 (A) NEGATIVE mg/dL   Hgb urine dipstick LARGE (A) NEGATIVE   Bilirubin Urine NEGATIVE NEGATIVE   Ketones, ur TRACE (A) NEGATIVE mg/dL   Protein, ur 30 (A) NEGATIVE mg/dL   Nitrite NEGATIVE NEGATIVE   Leukocytes,Ua MODERATE (A) NEGATIVE   RBC / HPF >50 0 - 5 RBC/hpf   WBC, UA >50 0 - 5 WBC/hpf   Bacteria, UA RARE (A) NONE SEEN   Squamous Epithelial / HPF 0-5 0 - 5 /HPF  Pregnancy, urine  Result Value Ref Range   Preg Test, Ur NEGATIVE NEGATIVE   US Pelvis Complete  Result Date: 02/22/2023 CLINICAL DATA:  Lower abdominal pain EXAM: TRANSABDOMINAL AND TRANSVAGINAL ULTRASOUND OF PELVIS DOPPLER ULTRASOUND OF OVARIES TECHNIQUE: Both transabdominal and transvaginal ultrasound  examinations of the pelvis were performed. Transabdominal technique was performed for global imaging of the pelvis including uterus, ovaries, adnexal regions, and pelvic cul-de-sac. It was necessary to proceed with endovaginal exam following the transabdominal exam to visualize the endometrium. Color and duplex Doppler ultrasound was utilized to evaluate blood flow to the ovaries. COMPARISON:  Pelvic ultrasound dated April 26, 2021 FINDINGS: Uterus Measurements: 5.0 x 2.8 x 4.8 = volume: 35.1 mL. Anteverted. No fibroids or other mass visualized. Endometrium Thickness: 6.8 mm.  No focal abnormality visualized. Right ovary Measurements: 4.3 x 1.9 x 2.4 cm = volume: 10.2 mL. Normal appearance/no adnexal mass. Right ovarian cyst measuring 2.4 x 1.6 x 1.8 cm. Left ovary Measurements: 2.9 x 2.0 x 2.3 cm = volume: 6.8 mL. Normal appearance/no adnexal mass. Pulsed Doppler evaluation of both ovaries demonstrates normal low-resistance arterial and venous waveforms. Other findings No abnormal free fluid. IMPRESSION: 1. No evidence of ovarian torsion. 2. Simple appearing right ovarian cyst measuring 2.4 cm. Electronically Signed   By: Allegra Lai M.D.   On: 02/22/2023 19:44   US Transvaginal Non-OB  Result Date: 02/22/2023 CLINICAL DATA:  Lower abdominal pain EXAM: TRANSABDOMINAL AND TRANSVAGINAL ULTRASOUND OF PELVIS DOPPLER ULTRASOUND OF OVARIES TECHNIQUE: Both transabdominal and transvaginal ultrasound examinations of the pelvis were  performed. Transabdominal technique was performed for global imaging of the pelvis including uterus, ovaries, adnexal regions, and pelvic cul-de-sac. It was necessary to proceed with endovaginal exam following the transabdominal exam to visualize the endometrium. Color and duplex Doppler ultrasound was utilized to evaluate blood flow to the ovaries. COMPARISON:  Pelvic ultrasound dated April 26, 2021 FINDINGS: Uterus Measurements: 5.0 x 2.8 x 4.8 = volume: 35.1 mL. Anteverted. No  fibroids or other mass visualized. Endometrium Thickness: 6.8 mm.  No focal abnormality visualized. Right ovary Measurements: 4.3 x 1.9 x 2.4 cm = volume: 10.2 mL. Normal appearance/no adnexal mass. Right ovarian cyst measuring 2.4 x 1.6 x 1.8 cm. Left ovary Measurements: 2.9 x 2.0 x 2.3 cm = volume: 6.8 mL. Normal appearance/no adnexal mass. Pulsed Doppler evaluation of both ovaries demonstrates normal low-resistance arterial and venous waveforms. Other findings No abnormal free fluid. IMPRESSION: 1. No evidence of ovarian torsion. 2. Simple appearing right ovarian cyst measuring 2.4 cm. Electronically Signed   By: Allegra Lai M.D.   On: 02/22/2023 19:44   [place] Art/Ven Flow Abd Pelv Doppler  Result Date: 02/22/2023 CLINICAL DATA:  Lower abdominal pain EXAM: TRANSABDOMINAL AND TRANSVAGINAL ULTRASOUND OF PELVIS DOPPLER ULTRASOUND OF OVARIES TECHNIQUE: Both transabdominal and transvaginal ultrasound examinations of the pelvis were performed. Transabdominal technique was performed for global imaging of the pelvis including uterus, ovaries, adnexal regions, and pelvic cul-de-sac. It was necessary to proceed with endovaginal exam following the transabdominal exam to visualize the endometrium. Color and duplex Doppler ultrasound was utilized to evaluate blood flow to the ovaries. COMPARISON:  Pelvic ultrasound dated April 26, 2021 FINDINGS: Uterus Measurements: 5.0 x 2.8 x 4.8 = volume: 35.1 mL. Anteverted. No fibroids or other mass visualized. Endometrium Thickness: 6.8 mm.  No focal abnormality visualized. Right ovary Measurements: 4.3 x 1.9 x 2.4 cm = volume: 10.2 mL. Normal appearance/no adnexal mass. Right ovarian cyst measuring 2.4 x 1.6 x 1.8 cm. Left ovary Measurements: 2.9 x 2.0 x 2.3 cm = volume: 6.8 mL. Normal appearance/no adnexal mass. Pulsed Doppler evaluation of both ovaries demonstrates normal low-resistance arterial and venous waveforms. Other findings No abnormal free fluid. IMPRESSION: 1. No  evidence of ovarian torsion. 2. Simple appearing right ovarian cyst measuring 2.4 cm. Electronically Signed   By: Allegra Lai M.D.   On: 02/22/2023 19:44     ED Course / MDM    Medical Decision Making Amount and/or Complexity of Data Reviewed Labs: ordered. Radiology: ordered.  Risk Prescription drug management.   2020 -- Discussed [place] results with the patient. She endorses her pain comes and goes but nausea has improved. Discussed her urinalysis does show some concerning findings for urinary tract infection.    PDMP reviewed, no dispenses of narcotic pain medications.  After consideration of the diagnostic results and the patients response to treatment, I feel that emergency department workup does not suggest an emergent condition requiring admission or immediate intervention beyond what has been performed at this time. The plan is: discharge to home with antibiotics and short course of pain medication. atient states that she is supposed to see a urogynecologist regarding her urinary incontinence for a few months and some ongoing pelvic pain.  She believes that her PCP wrote her a referral. . The patient is safe for discharge and has been instructed to return immediately for worsening symptoms, change in symptoms or any other concerns.   Jeanella Flattery 02/22/23 2026    Glyn Ade, MD 02/22/23 2322

## 2023-02-23 ENCOUNTER — Telehealth: Payer: Self-pay | Admitting: Nurse Practitioner

## 2023-02-23 NOTE — Telephone Encounter (Signed)
Fax from [facility]  Patient requests new rx, pt needs new monitor, strips and lancets for machine preferred by insurance  ( Fax sent in folder also)

## 2023-02-24 ENCOUNTER — Other Ambulatory Visit: Payer: Self-pay

## 2023-02-24 MED ORDER — BLOOD GLUCOSE TEST VI STRP: 1.0000 | ORAL_STRIP | Freq: Three times a day (TID) | 0 refills | Status: AC

## 2023-02-24 MED ORDER — BLOOD GLUCOSE MONITORING SUPPL DEVI: 1.0000 | Freq: Three times a day (TID) | 0 refills | Status: AC

## 2023-02-24 MED ORDER — LANCETS MISC. MISC: 1.0000 | Freq: Three times a day (TID) | 0 refills | Status: AC

## 2023-02-24 MED ORDER — LANCET DEVICE MISC: 1.0000 | Freq: Three times a day (TID) | 0 refills | Status: AC

## 2023-03-02 ENCOUNTER — Encounter: Payer: Self-pay | Admitting: Nurse Practitioner

## 2023-03-03 ENCOUNTER — Other Ambulatory Visit: Payer: Self-pay | Admitting: Nurse Practitioner

## 2023-03-03 DIAGNOSIS — E559 Vitamin D deficiency, unspecified: Secondary | ICD-10-CM

## 2023-03-03 LAB — CBC WITH DIFFERENTIAL/PLATELET
Basophils Absolute: 0.1 10*3/uL (ref 0.0–0.2)
Basos: 0 %
EOS (ABSOLUTE): 0.3 10*3/uL (ref 0.0–0.4)
Eos: 2 %
Hematocrit: 48.1 % — ABNORMAL HIGH (ref 34.0–46.6)
Hemoglobin: 15.4 g/dL (ref 11.1–15.9)
Immature Grans (Abs): 0 10*3/uL (ref 0.0–0.1)
Immature Granulocytes: 0 %
Lymphocytes Absolute: 2.7 10*3/uL (ref 0.7–3.1)
Lymphs: 22 %
MCH: 29.7 pg (ref 26.6–33.0)
MCHC: 32 g/dL (ref 31.5–35.7)
MCV: 93 fL (ref 79–97)
Monocytes Absolute: 0.6 10*3/uL (ref 0.1–0.9)
Monocytes: 5 %
Neutrophils Absolute: 8.6 10*3/uL — ABNORMAL HIGH (ref 1.4–7.0)
Neutrophils: 71 %
Platelets: 324 10*3/uL (ref 150–450)
RBC: 5.18 x10E6/uL (ref 3.77–5.28)
RDW: 11.9 % (ref 11.7–15.4)
WBC: 12.2 10*3/uL — ABNORMAL HIGH (ref 3.4–10.8)

## 2023-03-03 LAB — COMPREHENSIVE METABOLIC PANEL
ALT: 62 [IU]/L — ABNORMAL HIGH (ref 0–32)
AST: 69 [IU]/L — ABNORMAL HIGH (ref 0–40)
Albumin: 4.5 g/dL (ref 4.0–5.0)
Alkaline Phosphatase: 117 [IU]/L (ref 44–121)
BUN/Creatinine Ratio: 16 (ref 9–23)
BUN: 8 mg/dL (ref 6–20)
Bilirubin Total: 0.3 mg/dL (ref 0.0–1.2)
CO2: 25 mmol/L (ref 20–29)
Calcium: 10 mg/dL (ref 8.7–10.2)
Chloride: 98 mmol/L (ref 96–106)
Creatinine, Ser: 0.51 mg/dL — ABNORMAL LOW (ref 0.57–1.00)
Globulin, Total: 3.1 g/dL (ref 1.5–4.5)
Glucose: 161 mg/dL — ABNORMAL HIGH (ref 70–99)
Potassium: 3.9 mmol/L (ref 3.5–5.2)
Sodium: 137 mmol/L (ref 134–144)
Total Protein: 7.6 g/dL (ref 6.0–8.5)
eGFR: 134 mL/min/{1.73_m2} (ref >59)

## 2023-03-03 LAB — HEMOGLOBIN A1C
Est. average glucose Bld gHb Est-mCnc: 186 mg/dL
Hgb A1c MFr Bld: 8.1 % — ABNORMAL HIGH (ref 4.8–5.6)

## 2023-03-03 LAB — VITAMIN D 25 HYDROXY (VIT D DEFICIENCY, FRACTURES): Vit D, 25-Hydroxy: 16.7 ng/mL — ABNORMAL LOW (ref 30.0–100.0)

## 2023-03-03 LAB — TSH: TSH: 2.6 u[IU]/mL (ref 0.450–4.500)

## 2023-03-03 LAB — INSULIN, FREE AND TOTAL
Free Insulin: 16 uU/mL
Total Insulin: 16 uU/mL

## 2023-03-03 LAB — TESTOSTERONE, TOTAL, LC/MS/MS: Testosterone, total: 33 ng/dL (ref 10.0–55.0)

## 2023-03-03 LAB — DHEA-SULFATE: DHEA-SO4: 258 ug/dL (ref 110.0–431.7)

## 2023-03-03 MED ORDER — VITAMIN D (ERGOCALCIFEROL) 1.25 MG (50000 UNIT) PO CAPS
50000.0000 [IU] | ORAL_CAPSULE | ORAL | 3 refills | Status: DC
Start: 2023-03-03 — End: 2023-08-30

## 2023-03-04 ENCOUNTER — Other Ambulatory Visit: Payer: Self-pay

## 2023-03-04 DIAGNOSIS — E119 Type 2 diabetes mellitus without complications: Secondary | ICD-10-CM

## 2023-03-04 DIAGNOSIS — R748 Abnormal levels of other serum enzymes: Secondary | ICD-10-CM

## 2023-03-04 DIAGNOSIS — R7309 Other abnormal glucose: Secondary | ICD-10-CM

## 2023-03-04 MED ORDER — METFORMIN HCL ER 500 MG PO TB24: 500.0000 mg | ORAL_TABLET | Freq: Every day | ORAL | 1 refills | Status: DC

## 2023-03-06 ENCOUNTER — Telehealth: Payer: Self-pay | Admitting: Nurse Practitioner

## 2023-03-06 NOTE — Telephone Encounter (Signed)
Key: [facility]) PA Case ID #: [id number] Rx #: [id number] Drug Armodafinil 50MG  tablets ePA cloud logo Form [facility] Electronic [place] Form [phone] NCPDP) Original Claim Info 75 CALL [id number]

## 2023-03-07 ENCOUNTER — Encounter (HOSPITAL_BASED_OUTPATIENT_CLINIC_OR_DEPARTMENT_OTHER): Payer: Self-pay | Admitting: Physical Therapy

## 2023-03-07 ENCOUNTER — Other Ambulatory Visit: Payer: Self-pay

## 2023-03-07 ENCOUNTER — Ambulatory Visit (HOSPITAL_BASED_OUTPATIENT_CLINIC_OR_DEPARTMENT_OTHER): Payer: Medicaid Other | Attending: Nurse Practitioner | Admitting: Physical Therapy

## 2023-03-07 DIAGNOSIS — M25562 Pain in left knee: Secondary | ICD-10-CM | POA: Insufficient documentation

## 2023-03-07 DIAGNOSIS — M2480 Other specific joint derangements of unspecified joint, not elsewhere classified: Secondary | ICD-10-CM | POA: Diagnosis present

## 2023-03-07 DIAGNOSIS — G8929 Other chronic pain: Secondary | ICD-10-CM

## 2023-03-07 DIAGNOSIS — M25572 Pain in left ankle and joints of left foot: Secondary | ICD-10-CM

## 2023-03-07 DIAGNOSIS — M6281 Muscle weakness (generalized): Secondary | ICD-10-CM | POA: Diagnosis not present

## 2023-03-07 DIAGNOSIS — L503 Dermatographic urticaria: Secondary | ICD-10-CM

## 2023-03-07 DIAGNOSIS — M25571 Pain in right ankle and joints of right foot: Secondary | ICD-10-CM | POA: Diagnosis present

## 2023-03-07 DIAGNOSIS — K219 Gastro-esophageal reflux disease without esophagitis: Secondary | ICD-10-CM

## 2023-03-07 MED ORDER — ESOMEPRAZOLE MAGNESIUM 40 MG PO CPDR: DELAYED_RELEASE_CAPSULE | ORAL | 3 refills | Status: DC

## 2023-03-07 MED ORDER — LEVOCETIRIZINE DIHYDROCHLORIDE 5 MG PO TABS: 5.0000 mg | ORAL_TABLET | Freq: Every evening | ORAL | 3 refills | Status: DC

## 2023-03-07 NOTE — Therapy (Addendum)
OUTPATIENT PHYSICAL THERAPY LOWER EXTREMITY EVALUATION   Patient Name: Alyssa Bennett MRN: [id number] DOB:August 12, 2000, 22 y.o., female Today's Date: 03/07/2023  END OF SESSION:  PT End of Session - 03/07/23 1530     Visit Number 1    Number of Visits 12    Date for PT Re-Evaluation 06/05/23    Authorization Type [employer]    PT Start Time 1530    PT Stop Time 1615    PT Time Calculation (min) 45 min    Activity Tolerance Patient tolerated treatment well    Behavior During Therapy WFL for tasks assessed/performed              Past Medical History:  Diagnosis Date   ADHD (attention deficit hyperactivity disorder)    Atypical chest pain 12/25/2021   Atypical chest pain 12/25/2021   Migraine    Ovarian cyst    Stabbing headache 11/09/2021   Past Surgical History:  Procedure Laterality Date   TONSILLECTOMY     Patient Active Problem List   Diagnosis Date Noted   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   Depression, recurrent (HCC) 02/21/2023   Postural urinary incontinence 02/21/2023   Urinary urgency 02/21/2023   Generalized hypermobility of joints 02/21/2023   Psychophysiological insomnia 02/21/2023   Pre-diabetes 02/10/2022   Acute pain of left knee 02/02/2022   Acute right ankle pain 02/02/2022   Bulimia nervosa 12/31/2021   PTSD (post-traumatic stress disorder) 12/31/2021   Palpitations 12/04/2021   Gastroesophageal reflux disease 11/09/2021   Full body hives 11/09/2021   Tourette's syndrome 01/26/2019   Generalized anxiety disorder 05/25/2018   Irregular periods 04/01/2016   Dermatitis 07/09/2015   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   Classic migraine 07/08/2015   Tachycardia 07/08/2015   IBS (irritable bowel syndrome) 09/13/2012   Allergic rhinitis 08/04/2009    PCP: Enid Skeens, MD  REFERRING PROVIDER:   Tollie Eth, NP    REFERRING DIAG:   [phone] (ICD-10-CM) - Acute pain of left knee  M25.571  (ICD-10-CM) - Acute right ankle pain  M24.80 (ICD-10-CM) - Generalized hypermobility of joints    THERAPY DIAG: Chronic pain of left knee Generalized muscle weakness  Pain in L ankle and joints of the L foot Pain in R ankle and joints of the R foot  Rationale for Evaluation and Treatment: Rehabilitation  ONSET DATE:  >2 years   SUBJECTIVE:   SUBJECTIVE STATEMENT:  Pt reports bilateral knee pain occurring again with R ankle pain also now starting to bother her. Pt denies any recent falls or injury but has had the knees buckle that happens nearly daily. Bearing weight after standing and starting walking is painful. Pt states it feels "like pinching a nerve" but is unsure. Pt feels some NT into the L legs and will have tingling into the UE. Pt states that she walked a mile at work and notices more fatigue into the knees. Pt states the hypermobility is still and issue. Sitting with knees bent feels better than standing. Pt has soreness all over but the sharp pain is internal and lower into the fat pad. Pt wears braces that help with stability and don't change the pain. Pt reports swelling that occurs frequently and goes away. Also happens at the ankle. Pt notes the R ankle is sore and painful into the front the ankle and into the achilles. Still has clicking and instability in the R ankle. Pt reports consistently rolling both ankles.  PERTINENT HISTORY: Headaches and migraines, ADHD,  PAIN:  Are you having pain? Yes: NPRS scale: 2/10, 6/10 Pain location: Left knee, right ankle, R hip Pain description: sharp, dull, aching Aggravating factors: standing, walking, bearing weight Relieving factors: rest, ice, KT tape but skin breaks out from the adhesive   PRECAUTIONS: None  WEIGHT BEARING RESTRICTIONS: No  FALLS:  Has patient fallen in last 6 months? No  LIVING ENVIRONMENT: Lives with: lives with their family Lives in: House/apartment Stairs: No Has following equipment at home:  None  OCCUPATION: [employer]   PLOF: Independent  PATIENT GOALS: hiking  NEXT MD VISIT: none  OBJECTIVE:   DIAGNOSTIC FINDINGS: N/A for knee   PATIENT SURVEYS:   LEFS Lower Extremity Functional Score: 36 / 80 = 45.0 %   COGNITION: Overall cognitive status: Within functional limits for tasks assessed     SENSATION: WFL   POSTURE: genu valgus, toe out, genu recurvatum   PALPATION: TTP, right medial knee, Left medial knee TTP R ankle TCJ and gastroc  LOWER EXTREMITY ROM:  Passive ROM Right eval Left eval  Hip flexion [facility]  Hip extension    Hip abduction    Hip adduction    Hip internal rotation WFL pain at end range  [facility]  Hip external rotation 50 50  Knee flexion    Knee extension 10 hyper 10 hyper  Ankle dorsiflexion 0 p! 0  Ankle plantarflexion 60 60  Ankle inversion    Ankle eversion     (Blank rows = not tested)  LOWER EXTREMITY MMT:  MMT Right eval Left eval  Hip flexion    Hip extension    Hip abduction 22.1 19.5  Hip adduction    Hip internal rotation    Hip external rotation    Knee flexion    Knee extension 17.5 23.5  Ankle dorsiflexion    Ankle plantarflexion 19.1 20.8  Ankle inversion    Ankle eversion     (Blank rows = not tested)  LOWER EXTREMITY SPECIAL TESTS:   Beighton Hicks: Beighton Score: 9 / 9 = 100.0 %   FUNCTIONAL TESTS: Stairs: WFL but pain with ascending and descending; requires UE support   SLS: able to do 10s with dynamic valgus and Trendelenburg   GAIT: Genu valgus, toe out, pes planus   TODAY'S TREATMENT:                                                                                                                              DATE:  10/14  Brace usage, AD usage, avoiding hyperextension positions, potential for EDS  Exercises - Wall Sit  - 1 x daily - 7 x weekly - 1 sets - 3 reps - 10 hold - Heel Raises with Counter Support  - 1 x daily - 7 x weekly - 1 sets - 3 reps - 10 hold - Mini Squat with  Counter Support  - 1 x daily -  7 x weekly - 3 sets - 5 reps   PATIENT EDUCATION:  Education details: MOI, diagnosis, prognosis, anatomy, exercise progression, DOMS expectations, muscle firing,  envelope of function, HEP, POC Person educated: Patient Education method: Explanation, Demonstration, Tactile cues, Verbal cues, and Handouts Education comprehension: verbalized understanding, returned demonstration, verbal cues required, tactile cues required, and needs further education  HOME EXERCISE PROGRAM: Access Code: [email or web address] URL: [email or web address] Date: 03/07/2023 Prepared by: Zebedee Iba  ASSESSMENT:  CLINICAL IMPRESSION: Patient is a 22 y.o. female who was seen today for physical therapy evaluation and treatment for c/c of bilat knee and ankle pain. Pt's s/s appear consistent with history of hypermobility and muscle weakness. Pt does score 9/9 on Beighton-Hicks scale. Pt's knee pain appears largely related to knee hypermobility as well as lack of strength and motor control of the LE. Pt's pain is moderately sensitive and irritable with movement. Pt's is more strength limited at this time. Plan to continue with motor control and mid range strength of knee extensors/hips/ankles at future sessions. Pt would benefit from continued skilled therapy in order to reach goals and maximize functional LE strength for return to normal ADL, community mobility, and occupational duties.    OBJECTIVE IMPAIRMENTS: decreased activity tolerance, decreased endurance, and decreased strength.   ACTIVITY LIMITATIONS: lifting, bending, standing, squatting, and stairs  PARTICIPATION LIMITATIONS: meal prep, cleaning, laundry, driving, shopping, community activity, and occupation  PERSONAL FACTORS: time since onset of injury, past experiences, and 2+ comorbidities: also affecting patient's functional outcome.   REHAB POTENTIAL: Fair  CLINICAL DECISION MAKING: Evolving/moderate  complexity  EVALUATION COMPLEXITY: Moderate   GOALS:  SHORT TERM GOALS: 04/18/2023  Pt will become independent with HEP in order to demonstrate synthesis of PT education.  Baseline: Goal status: INITIAL  2. Pt will have an at least 9 pt improvement in LEFS measure in order to demonstrate MCID improvement in daily function.  Baseline:  Goal status: INITIAL  3.  Pt will be able to demonstrate/report ability to sit/stand/sleep for extended periods of time without pain in order to demonstrate functional improvement and tolerance to static positioning.  Baseline:  Goal status: INITIAL   LONG TERM GOALS:  05/30/2023   Patient will be able to stand for greater than 30 minutes without significant increase of pain. Baseline:  Goal status: INITIAL  2. Pt will be able to demonstrate/report ability to walk >30 mins without pain in order to demonstrate functional improvement and tolerance to exercise and community mobility.  Baseline:  Goal status: INITIAL  3.  Pt will have an at least 18 pt improvement in LEFS measure in order to demonstrate MCID improvement in daily function.   Baseline:  Goal status: INITIAL  4. Pt will be able to lift/squat/hold >15 lbs in order to demonstrate functional improvement in LE strength for return to normal ADL and exercise.    Baseline:  Goal status: INITIAL      PLAN:  PT FREQUENCY: 1x/week  PT DURATION: 12 wks (plan to DC in 8 wks)   PLANNED INTERVENTIONS: Therapeutic exercises, Therapeutic activity, Neuromuscular re-education, Balance training, Gait training, Patient/Family education, Self Care, Joint mobilization, Dry Needling, Electrical stimulation, Cryotherapy, Moist heat, Taping, Ultrasound, and Manual therapy  PLAN FOR NEXT SESSION: Consider upright bike, SAQ, minisquats. Strengthening of LU bilaterally and progress to stair training. TPDN for lower back and legs PRN    Zebedee Iba PT, DPT 03/07/23 5:03 PM  Check all possible CPT  codes: 44034 - PT Re-evaluation, 97110- Therapeutic Exercise, [phone]  Neuro Re-education, [phone] - Gait Training, 41324 - Manual Therapy, [phone] - Therapeutic Activities, [phone] - Self Care, [phone] - Mechanical traction, [id number] - Electrical stimulation (unattended), [phone] - Electrical stimulation (Manual), [id number] - Iontophoresis, [id number] - Ultrasound, [id number] - Orthotic Fit, and [id number] - Aquatic therapy    Check all conditions that are expected to impact treatment: {Conditions expected to impact treatment:Musculoskeletal disorders, Psychological or psychiatric disorders, Active major medical illness, and Unknown   If treatment provided at initial evaluation, no treatment charged due to lack of authorization.

## 2023-03-07 NOTE — Telephone Encounter (Signed)
PA completed.

## 2023-03-14 NOTE — Telephone Encounter (Signed)
P.A. denied, medication is not approved for this diagnosis (ADHD), do you want to switch to another medication? Or call [phone] for peer to peer

## 2023-03-17 NOTE — Telephone Encounter (Signed)
The other option is cash pay with [facility] $20, [facility]25 [facility]

## 2023-03-23 ENCOUNTER — Telehealth (HOSPITAL_BASED_OUTPATIENT_CLINIC_OR_DEPARTMENT_OTHER): Payer: Self-pay | Admitting: Physical Therapy

## 2023-03-23 ENCOUNTER — Ambulatory Visit (HOSPITAL_BASED_OUTPATIENT_CLINIC_OR_DEPARTMENT_OTHER): Payer: Medicaid Other | Admitting: Physical Therapy

## 2023-03-23 NOTE — Telephone Encounter (Signed)
Called patient regarding no show visit. She was advised of her next appointment and advised to contact therapy within 24 hours if she can not make her next appointment.

## 2023-03-24 NOTE — Telephone Encounter (Signed)
done

## 2023-03-25 ENCOUNTER — Ambulatory Visit: Payer: Medicaid Other | Admitting: Internal Medicine

## 2023-03-25 ENCOUNTER — Encounter: Payer: Self-pay | Admitting: Internal Medicine

## 2023-03-25 ENCOUNTER — Other Ambulatory Visit: Payer: Self-pay

## 2023-03-25 VITALS — BP 120/70 | HR 84 | Temp 98.7°F | Resp 14 | Ht 61.0 in | Wt 194.2 lb

## 2023-03-25 DIAGNOSIS — L500 Allergic urticaria: Secondary | ICD-10-CM

## 2023-03-25 DIAGNOSIS — L503 Dermatographic urticaria: Secondary | ICD-10-CM

## 2023-03-25 DIAGNOSIS — J3089 Other allergic rhinitis: Secondary | ICD-10-CM

## 2023-03-25 MED ORDER — LEVOCETIRIZINE DIHYDROCHLORIDE 5 MG PO TABS: 5.0000 mg | ORAL_TABLET | Freq: Two times a day (BID) | ORAL | 5 refills | Status: DC | PRN

## 2023-03-25 MED ORDER — FLUTICASONE PROPIONATE 50 MCG/ACT NA SUSP: 2.0000 | Freq: Every day | NASAL | 5 refills | Status: DC

## 2023-03-25 NOTE — Progress Notes (Signed)
NEW PATIENT  Date of Service/Encounter:  03/25/23  Consult requested by: Tollie Eth, NP   Subjective:   Alyssa Bennett (DOB: [birth year]) is a 22 y.o. female who presents to the clinic on 03/25/2023 with a chief complaint of hives and allergies.  .    History obtained from: chart review and patient.   Rhinitis:  Started since was little.    Symptoms include: nasal congestion, rhinorrhea, post nasal drainage, and sneezing  Occurs seasonally-Spring/Fall Potential triggers: cats, feathers and pollen and not sure of others   Treatments tried:  Xyzal PRN Mucinex PRN Flonase in the past    Previous allergy testing: possibly in infancy  History of reflux/heartburn: yes on Nexium History of sinus surgery: no Nonallergic triggers: none   Chronic Urticaria/Angioedema: Symptoms:  Started about 3-4 years ago Saw Dermatology and was told it was chronic hives Hives are itchy, red.   Using Xyzal 5mg  daily; if she stops it, they flare up.   Has tried fungal cream in the past without relief.   Angioedema Episodes: none  ER visits/clinic visits/hospitalization: none  Triggers: not sure   Past Medical History: Past Medical History:  Diagnosis Date   ADHD (attention deficit hyperactivity disorder)    Angio-edema    Asthma    Atypical chest pain 12/25/2021   Atypical chest pain 12/25/2021   Migraine    Ovarian cyst    Stabbing headache 11/09/2021   Urticaria    Past Surgical History: Past Surgical History:  Procedure Laterality Date   TONSILLECTOMY      Family History: Family History  Problem Relation Age of Onset   Allergic rhinitis Mother    Migraines Mother    ADD / ADHD Mother    Anxiety disorder Mother    Allergic rhinitis Father    Heart attack Father 44   Bradycardia Father    Allergic rhinitis Sister    Eczema Sister    Allergic rhinitis Brother    ADD / ADHD Brother    Headache Brother    Allergic rhinitis Maternal Grandmother    Migraines  Maternal Grandmother    Anxiety disorder Maternal Grandmother    Allergic rhinitis Maternal Grandfather    Allergic rhinitis Paternal Grandmother    Heart attack Paternal Grandmother    Allergic rhinitis Paternal Grandfather    Seizures Neg Hx    Autism Neg Hx    Depression Neg Hx    Bipolar disorder Neg Hx    Schizophrenia Neg Hx     Social History:   Pets: Medical laboratory scientific officer and dog Tobacco use/exposure: none Job: pharm tech  Medication List:  Allergies as of 03/25/2023       Reactions   Zithromax [azithromycin] Itching, Rash        Medication List        Accurate as of March 25, 2023  2:15 PM. If you have any questions, ask your nurse or doctor.          Accu-Chek Guide test strip Generic drug: glucose blood Check once daily   BLOOD GLUCOSE TEST STRIPS Strp 1 each by In Vitro route in the morning, at noon, and at bedtime. May substitute to any manufacturer covered by patient's insurance.   Armodafinil 50 MG tablet Commonly known as: Nuvigil Take 1 tablet (50 mg total) by mouth daily as needed (ADHD). Take in morning.   Blood Glucose Monitoring Suppl Devi 1 each by Does not apply route in the morning, at noon, and at bedtime.  May substitute to any manufacturer covered by patient's insurance.   clotrimazole 1 % cream Commonly known as: LOTRIMIN Apply 1 Application topically as needed (To relieve hives).   DULoxetine 20 MG capsule Commonly known as: Cymbalta Take 1 capsule (20 mg total) by mouth daily. Take for 1 week, then increase to one 30 mg capsule daily   esomeprazole 40 MG capsule Commonly known as: NexIUM One tab by mouth at dinner time.   HYDROcodone-acetaminophen 5-325 MG tablet Commonly known as: NORCO/VICODIN Take 1 tablet by mouth every 6 (six) hours as needed for severe pain.   Junel FE 1/20 1-20 MG-MCG tablet Generic drug: norethindrone-ethinyl estradiol-FE TAKE 1 TABLET EVERY DAY   Lancet Device Misc 1 each by Does not apply route in the  morning, at noon, and at bedtime. May substitute to any manufacturer covered by patient's insurance.   Lancets Misc. Misc 1 each by Does not apply route in the morning, at noon, and at bedtime. May substitute to any manufacturer covered by patient's insurance.   levocetirizine 5 MG tablet Commonly known as: XYZAL Take 1 tablet (5 mg total) by mouth every evening.   metFORMIN 500 MG 24 hr tablet Commonly known as: GLUCOPHAGE-XR Take 1 tablet (500 mg total) by mouth at bedtime. Take with supper   ondansetron 8 MG tablet Commonly known as: Zofran Take 1 tablet (8 mg total) by mouth every 8 (eight) hours as needed for nausea or vomiting.   rizatriptan 10 MG tablet Commonly known as: Maxalt Take 1 tablet (10 mg total) by mouth as needed for migraine. May repeat in 2 hours if needed   traZODone 50 MG tablet Commonly known as: DESYREL Take 0.5-2 tablets (25-100 mg total) by mouth at bedtime as needed for sleep.   Vitamin D (Ergocalciferol) 1.25 MG (50000 UNIT) Caps capsule Commonly known as: DRISDOL Take 1 capsule (50,000 Units total) by mouth every 7 (seven) days.         REVIEW OF SYSTEMS: Pertinent positives and negatives discussed in HPI.   Objective:   Physical Exam: BP 120/70 (BP Location: Right Arm, Patient Position: Sitting, Cuff Size: Normal)   Pulse 84   Temp 98.7 F (37.1 C) (Temporal)   Resp 14   Ht 5\' 1"  (1.549 m)   Wt 194 lb 3.2 oz (88.1 kg)   SpO2 97%   BMI 36.69 kg/m  Body mass index is 36.69 kg/m. GEN: alert, well developed HEENT: clear conjunctiva, TM grey and translucent, nose with + mild inferior turbinate hypertrophy, pink nasal mucosa, slight clear rhinorrhea, + cobblestoning HEART: regular rate and rhythm, no murmur LUNGS: clear to auscultation bilaterally, no coughing, unlabored respiration ABDOMEN: soft, non distended  SKIN: no rashes or lesions  Reviewed:  02/21/2023: seen by Early NP for ADHD, IBS, GERD, hives, bulimia nervosa, depression,  urinary urgency/incontinence and hypermobile joints.  On xyzal. Referred to Allergy, Urology, GI, Psychiatry, PT.  02/08/2023: seen by Dr Delena Bali for occipital neuralgia/migraines.  Started on Pristiq.    05/04/2022: seen by Cardiology Walker NP for orthostatic hypotension, palpitations. Zio patch and cardiac CTA unrevealing.  Plan to do Echo.   Assessment:   1. Other allergic rhinitis   2. Allergic urticaria     Plan/Recommendations:  Urticaria (Hives): - At this time etiology of hives and swelling is unknown. Hives can be caused by a variety of different triggers including illness/infection, exercise, pressure, vibrations, extremes of temperature to name a few however majority of the time there is no identifiable trigger.  -  Use Xyzal 5mg  daily.  Hold this and all other anti histamines 2-3 days prior days  Other Allergic Rhinitis: - Due to turbinate hypertrophy, seasonal symptoms and unresponsive to over the counter meds, will perform skin testing to identify aeroallergen triggers.   - Use nasal saline rinses before nose sprays such as with Neilmed Sinus Rinse.  Use distilled water.   - Use Flonase 2 sprays each nostril daily. Aim upward and outward. - Use Xyzal 5mg  daily.  Hold this and all other anti histamines 2-3 days prior days    Follow up: 11/8 Friday at 10 AM for skin testing 1-68.     No follow-ups on file.  Alesia Morin, MD [facility]

## 2023-03-25 NOTE — Patient Instructions (Addendum)
Urticaria (Hives): - At this time etiology of hives and swelling is unknown. Hives can be caused by a variety of different triggers including illness/infection, exercise, pressure, vibrations, extremes of temperature to name a few however majority of the time there is no identifiable trigger.  - Use Xyzal 5mg  daily.  Hold this and all other anti histamines 2-3 days prior days  Other Allergic Rhinitis:  - Use nasal saline rinses before nose sprays such as with Neilmed Sinus Rinse.  Use distilled water.   - Use Flonase 2 sprays each nostril daily. Aim upward and outward. - Use Xyzal 5mg  daily.  Hold this and all other anti histamines 2-3 days prior days    Follow up: 11/8 Friday at 10 AM for skin testing.

## 2023-03-28 ENCOUNTER — Ambulatory Visit: Payer: Medicaid Other | Admitting: Obstetrics and Gynecology

## 2023-03-30 ENCOUNTER — Ambulatory Visit (HOSPITAL_BASED_OUTPATIENT_CLINIC_OR_DEPARTMENT_OTHER): Payer: Medicaid Other | Admitting: Physical Therapy

## 2023-03-30 ENCOUNTER — Encounter (HOSPITAL_BASED_OUTPATIENT_CLINIC_OR_DEPARTMENT_OTHER): Payer: Self-pay | Admitting: Physical Therapy

## 2023-04-01 ENCOUNTER — Ambulatory Visit (INDEPENDENT_AMBULATORY_CARE_PROVIDER_SITE_OTHER): Payer: Medicaid Other | Admitting: Internal Medicine

## 2023-04-01 DIAGNOSIS — J301 Allergic rhinitis due to pollen: Secondary | ICD-10-CM

## 2023-04-01 DIAGNOSIS — J3089 Other allergic rhinitis: Secondary | ICD-10-CM

## 2023-04-01 DIAGNOSIS — L500 Allergic urticaria: Secondary | ICD-10-CM | POA: Diagnosis not present

## 2023-04-01 MED ORDER — FAMOTIDINE 20 MG PO TABS: 20.0000 mg | ORAL_TABLET | Freq: Two times a day (BID) | ORAL | 5 refills | Status: DC | PRN

## 2023-04-01 NOTE — Progress Notes (Signed)
FOLLOW UP Date of Service/Encounter:  04/01/23   Subjective:  Alyssa Bennett (DOB: 2000-11-26) is a 22 y.o. female who [facility] for follow up for skin testing.   History obtained from: chart review and patient.  Anti histamines held.  Past Medical History: Past Medical History:  Diagnosis Date   ADHD (attention deficit hyperactivity disorder)    Angio-edema    Asthma    Atypical chest pain 12/25/2021   Atypical chest pain 12/25/2021   Migraine    Ovarian cyst    Stabbing headache 11/09/2021   Urticaria     Objective:  There were no vitals taken for this visit. There is no height or weight on file to calculate BMI. Physical Exam: GEN: alert, well developed HEENT: clear conjunctiva, MMM LUNGS: no coughing, unlabored respiration   Skin Testing:  Skin prick testing was placed, which includes aeroallergens/foods, histamine control, and saline control.  Verbal consent was obtained prior to placing test.  Patient tolerated procedure well.  Allergy testing results were read and interpreted by myself, documented by clinical staff. Adequate positive and negative control.  Positive results to:  Results discussed with patient/family.  Airborne Adult Perc - 04/01/23 1002     Time Antigen Placed 1002    Allergen Manufacturer Greer    Location Back    Number of Test 55    1. Control-Buffer 50% Glycerol Negative    2. Control-Histamine 3+    3. Bahia Negative    4. [place] 3+    5. Johnson Negative    6. Kentucky Blue Negative    7. Meadow Fescue Negative    8. Perennial Rye Negative    9. Timothy Negative    10. Ragweed Mix Negative    11. Cocklebur Negative    12. Plantain,  English Negative    13. Baccharis Negative    14. Dog Fennel Negative    15. Russian Thistle Negative    16. Lamb's Quarters 2+    17. Sheep Sorrell Negative    18. Rough Pigweed Negative    19. Marsh Elder, Rough Negative    20. Mugwort, Common  Negative    21. Box, Elder Negative    22. Cedar, red Negative    23. Sweet Gum Negative    24. Pecan Pollen Negative    25. Pine Mix Negative    26. Walnut, Black Pollen Negative    27. Red Mulberry Negative    28. Ash Mix Negative    29. Birch Mix Negative    30. Beech American Negative    31. Cottonwood, Guinea-Bissau Negative    32. Hickory, White Negative    33. Maple Mix Negative    34. Oak, [place] Mix Negative    35. Sycamore Eastern Negative    36. Alternaria Alternata Negative    37. Cladosporium Herbarum Negative    38. Aspergillus Mix Negative    39. Penicillium Mix Negative    40. Bipolaris Sorokiniana (Helminthosporium) Negative    41. Drechslera Spicifera (Curvularia) Negative    42. Mucor Plumbeus Negative    43. Fusarium Moniliforme Negative    44. Aureobasidium Pullulans (pullulara) Negative    45. Rhizopus Oryzae Negative    46. Botrytis Cinera Negative    47. Epicoccum Nigrum Negative    48. Phoma Betae Negative    49. Dust Mite Mix 2+    50. Cat Hair 10,000 BAU/ml Negative    51.  Dog  Epithelia Negative    52. Mixed Feathers Negative    53. Horse Epithelia Negative    54. Cockroach, German Negative    55. Tobacco Leaf 2+             13 Food Perc - 04/01/23 1002       Test Information   Time Antigen Placed 1003    Allergen Manufacturer Greer    Location Back    Number of allergen test 13      Food   1. Peanut Negative    2. Soybean Negative    3. Wheat Negative    4. Sesame Negative    5. Milk, Cow Negative    6. Casein Negative    7. Egg White, Chicken Negative    8. Shellfish Mix Negative    9. Fish Mix Negative    10. Cashew Negative    11. Walnut Food Negative    12. Almond Negative    13. Hazelnut Negative              Assessment:   1. Allergic urticaria   2. Seasonal allergic rhinitis due to pollen   3. Allergic rhinitis due to dust mite     Plan/Recommendations:  Urticaria (Hives): - At this time etiology of hives  and swelling is unknown. Hives can be caused by a variety of different triggers including illness/infection, exercise, pressure, vibrations, extremes of temperature to name a few however majority of the time there is no identifiable trigger.  - SPT 03/2023: positive to grasses, weeds, dust mites, tobacco leaf; negative to commonly allergenic foods.  - Use Xyzal 5mg  daily.  -If no improvement in 2-3 days, increase to Xyzal 5mg  twice daily.  -If no improvement in 2-3 days, add Pepcid 20mg  twice daily and continue Xyzal 5mg  twice daily.  Allergic Rhinitis:  - Due to turbinate hypertrophy, recurrent hives, seasonal symptoms and unresponsive to over the counter meds, will perform skin testing to identify aeroallergen triggers.  - SPT 03/2023: grasses, weeds, dust mites, tobacco leaf  - Use nasal saline rinses before nose sprays such as with Neilmed Sinus Rinse.  Use distilled water.   - Use Flonase 2 sprays each nostril daily. Aim upward and outward. - Use Xyzal 5mg  daily.    ALLERGEN AVOIDANCE MEASURES   Dust Mites Use central air conditioning and heat; and change the filter monthly.  Pleated filters work better than mesh filters.  Electrostatic filters may also be used; wash the filter monthly.  Window air conditioners may be used, but do not clean the air as well as a central air conditioner.  Change or wash the filter monthly. Keep windows closed.  Do not use attic fans.   Encase the mattress, box springs and pillows with zippered, dust proof covers. Wash the bed linens in hot water weekly.   Remove carpet, especially from the bedroom. Remove stuffed animals, throw pillows, dust ruffles, heavy drapes and other items that collect dust from the bedroom. Do not use a humidifier.   Use wood, vinyl or leather furniture instead of cloth furniture in the bedroom. Keep the indoor humidity at 30 - 40%.   Pollen Avoidance Pollen levels are highest during the mid-day and afternoon.  Consider this  when planning outdoor activities. Avoid being outside when the grass is being mowed, or wear a mask if the pollen-allergic person must be the one to mow the grass. Keep the windows closed to keep pollen outside of the home. Use an  air conditioner to filter the air. Take a shower, wash hair, and change clothing after working or playing outdoors during pollen season.       Return in about 6 weeks (around 05/13/2023).  Alesia Morin, MD [facility]

## 2023-04-01 NOTE — Patient Instructions (Addendum)
Urticaria (Hives): - At this time etiology of hives and swelling is unknown. Hives can be caused by a variety of different triggers including illness/infection, exercise, pressure, vibrations, extremes of temperature to name a few however majority of the time there is no identifiable trigger.  - Use Xyzal 5mg  daily.  -If no improvement in 2-3 days, increase to Xyzal 5mg  twice daily.  -If no improvement in 2-3 days, add Pepcid 20mg  twice daily and continue Xyzal 5mg  twice daily.  Allergic Rhinitis:  - SPT 03/2023: grasses, weeds, dust mites, tobacco leaf  - Use nasal saline rinses before nose sprays such as with Neilmed Sinus Rinse.  Use distilled water.   - Use Flonase 2 sprays each nostril daily. Aim upward and outward. - Use Xyzal 5mg  daily.    ALLERGEN AVOIDANCE MEASURES   Dust Mites Use central air conditioning and heat; and change the filter monthly.  Pleated filters work better than mesh filters.  Electrostatic filters may also be used; wash the filter monthly.  Window air conditioners may be used, but do not clean the air as well as a central air conditioner.  Change or wash the filter monthly. Keep windows closed.  Do not use attic fans.   Encase the mattress, box springs and pillows with zippered, dust proof covers. Wash the bed linens in hot water weekly.   Remove carpet, especially from the bedroom. Remove stuffed animals, throw pillows, dust ruffles, heavy drapes and other items that collect dust from the bedroom. Do not use a humidifier.   Use wood, vinyl or leather furniture instead of cloth furniture in the bedroom. Keep the indoor humidity at 30 - 40%.   Pollen Avoidance Pollen levels are highest during the mid-day and afternoon.  Consider this when planning outdoor activities. Avoid being outside when the grass is being mowed, or wear a mask if the pollen-allergic person must be the one to mow the grass. Keep the windows closed to keep pollen outside of the home. Use  an air conditioner to filter the air. Take a shower, wash hair, and change clothing after working or playing outdoors during pollen season.

## 2023-04-06 ENCOUNTER — Encounter (HOSPITAL_BASED_OUTPATIENT_CLINIC_OR_DEPARTMENT_OTHER): Payer: Medicaid Other | Admitting: Physical Therapy

## 2023-04-13 ENCOUNTER — Ambulatory Visit (HOSPITAL_BASED_OUTPATIENT_CLINIC_OR_DEPARTMENT_OTHER): Payer: Medicaid Other | Admitting: Physical Therapy

## 2023-04-14 ENCOUNTER — Ambulatory Visit
Admission: RE | Admit: 2023-04-14 | Discharge: 2023-04-14 | Disposition: A | Discharge status: Home / Self Care | Payer: Medicaid Other | Source: Ambulatory Visit | Attending: Nurse Practitioner | Admitting: Nurse Practitioner

## 2023-04-14 DIAGNOSIS — R748 Abnormal levels of other serum enzymes: Secondary | ICD-10-CM

## 2023-04-18 ENCOUNTER — Encounter: Payer: Self-pay | Admitting: Nurse Practitioner

## 2023-04-20 ENCOUNTER — Encounter (HOSPITAL_BASED_OUTPATIENT_CLINIC_OR_DEPARTMENT_OTHER): Payer: Medicaid Other | Admitting: Physical Therapy

## 2023-05-01 ENCOUNTER — Other Ambulatory Visit: Payer: Self-pay | Admitting: Nurse Practitioner

## 2023-05-01 DIAGNOSIS — F988 Other specified behavioral and emotional disorders with onset usually occurring in childhood and adolescence: Secondary | ICD-10-CM

## 2023-05-02 NOTE — Telephone Encounter (Signed)
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 She uses [id number] - [place], [place] - [address]

## 2023-05-02 NOTE — Telephone Encounter (Signed)
Last apt 02/21/23.

## 2023-05-05 MED ORDER — ARMODAFINIL 50 MG PO TABS: 50.0000 mg | ORAL_TABLET | Freq: Every day | ORAL | 0 refills | Status: DC | PRN

## 2023-05-09 ENCOUNTER — Emergency Department (HOSPITAL_BASED_OUTPATIENT_CLINIC_OR_DEPARTMENT_OTHER)
Admission: EM | Admit: 2023-05-09 | Discharge: 2023-05-09 | Disposition: A | Discharge status: Home / Self Care | Payer: Medicaid Other | Attending: Emergency Medicine | Admitting: Emergency Medicine

## 2023-05-09 ENCOUNTER — Emergency Department (HOSPITAL_BASED_OUTPATIENT_CLINIC_OR_DEPARTMENT_OTHER): Payer: Medicaid Other | Admitting: Radiology

## 2023-05-09 ENCOUNTER — Other Ambulatory Visit: Payer: Self-pay

## 2023-05-09 DIAGNOSIS — W19XXXA Unspecified fall, initial encounter: Secondary | ICD-10-CM | POA: Diagnosis not present

## 2023-05-09 DIAGNOSIS — M25551 Pain in right hip: Secondary | ICD-10-CM | POA: Insufficient documentation

## 2023-05-09 DIAGNOSIS — J45909 Unspecified asthma, uncomplicated: Secondary | ICD-10-CM | POA: Insufficient documentation

## 2023-05-09 DIAGNOSIS — S8391XA Sprain of unspecified site of right knee, initial encounter: Secondary | ICD-10-CM | POA: Diagnosis not present

## 2023-05-09 DIAGNOSIS — S8991XA Unspecified injury of right lower leg, initial encounter: Secondary | ICD-10-CM | POA: Diagnosis present

## 2023-05-09 DIAGNOSIS — S8390XA Sprain of unspecified site of unspecified knee, initial encounter: Secondary | ICD-10-CM

## 2023-05-09 MED ORDER — KETOROLAC TROMETHAMINE 15 MG/ML IJ SOLN
15.0000 mg | Freq: Once | INTRAMUSCULAR | Status: AC
  Administered 2023-05-09: 15 mg via INTRAMUSCULAR
  Filled 2023-05-09: qty 1

## 2023-05-09 MED ORDER — OXYCODONE-ACETAMINOPHEN 5-325 MG PO TABS: 1.0000 | ORAL_TABLET | Freq: Three times a day (TID) | ORAL | 0 refills | Status: DC | PRN

## 2023-05-09 MED ORDER — ONDANSETRON 4 MG PO TBDP
4.0000 mg | ORAL_TABLET | Freq: Once | ORAL | Status: AC
  Administered 2023-05-09: 4 mg via ORAL
  Filled 2023-05-09: qty 1

## 2023-05-09 NOTE — ED Provider Notes (Signed)
[place] [facility] AT [facility] Provider Note   CSN: [id number] Arrival date & time: 05/09/23  1622     History  Chief Complaint  Patient presents with   Alyssa Bennett is a 22 y.o. female.   Fall  Patient presents with right knee and hip pain after fall.  Reportedly slipped on a rug and left leg went out and landed down onto her right knee.  Complaining of pain in right knee and right hip.  Does have chronic knee pain.  Reportedly had laxities to start with.  Did not hit head.  Difficulty walking due to the pain.    Past Medical History:  Diagnosis Date   ADHD (attention deficit hyperactivity disorder)    Angio-edema    Asthma    Atypical chest pain 12/25/2021   Atypical chest pain 12/25/2021   Migraine    Ovarian cyst    Stabbing headache 11/09/2021   Urticaria     Home Medications Prior to Admission medications   Medication Sig Start Date End Date Taking? Authorizing Provider  oxyCODONE-acetaminophen (PERCOCET/ROXICET) 5-325 MG tablet Take 1-2 tablets by mouth every 8 (eight) hours as needed for severe pain (pain score 7-10). 05/09/23  Yes Benjiman Core, MD  Armodafinil (NUVIGIL) 50 MG tablet Take 1 tablet (50 mg total) by mouth daily as needed (ADHD). Take in morning. 05/05/23   Tollie Eth, NP  Blood Glucose Monitoring Suppl DEVI 1 each by Does not apply route in the morning, at noon, and at bedtime. May substitute to any manufacturer covered by patient's insurance. 02/24/23   Tollie Eth, NP  clotrimazole (LOTRIMIN) 1 % cream Apply 1 Application topically as needed (To relieve hives). 01/08/22   [provider]  DULoxetine (CYMBALTA) 20 MG capsule Take 1 capsule (20 mg total) by mouth daily. Take for 1 week, then increase to one 30 mg capsule daily 02/14/23   Ocie Doyne, MD  esomeprazole (NEXIUM) 40 MG capsule One tab by mouth at dinner time. 03/07/23   Tollie Eth, NP  famotidine (PEPCID) 20 MG tablet Take 1  tablet (20 mg total) by mouth 2 (two) times daily as needed (hives). 04/01/23   Birder Robson, MD  fluticasone (FLONASE) 50 MCG/ACT nasal spray Place 2 sprays into both nostrils daily. 03/25/23   Birder Robson, MD  glucose blood (ACCU-CHEK GUIDE) test strip Check once daily 02/22/23   Early, [name], NP  HYDROcodone-acetaminophen (NORCO/VICODIN) 5-325 MG tablet Take 1 tablet by mouth every 6 (six) hours as needed for severe pain. 02/22/23   Roemhildt, Lorin T, PA-C  levocetirizine (XYZAL) 5 MG tablet Take 1 tablet (5 mg total) by mouth 2 (two) times daily as needed for allergies (or hives). 03/25/23   Birder Robson, MD  metFORMIN (GLUCOPHAGE-XR) 500 MG 24 hr tablet Take 1 tablet (500 mg total) by mouth at bedtime. Take with supper 03/04/23   Tollie Eth, NP  norethindrone-ethinyl estradiol-FE (JUNEL FE 1/20) 1-20 MG-MCG tablet TAKE 1 TABLET EVERY DAY 02/08/23   Early, [name], NP  ondansetron (ZOFRAN) 8 MG tablet Take 1 tablet (8 mg total) by mouth every 8 (eight) hours as needed for nausea or vomiting. 02/08/23   Ocie Doyne, MD  rizatriptan (MAXALT) 10 MG tablet Take 1 tablet (10 mg total) by mouth as needed for migraine. May repeat in 2 hours if needed 11/02/21   Early, [name], NP  traZODone (DESYREL) 50 MG tablet Take 0.5-2  tablets (25-100 mg total) by mouth at bedtime as needed for sleep. 02/21/23   Tollie Eth, NP  Vitamin D, Ergocalciferol, (DRISDOL) 1.25 MG (50000 UNIT) CAPS capsule Take 1 capsule (50,000 Units total) by mouth every 7 (seven) days. 03/03/23   Tollie Eth, NP      Allergies    Zithromax [azithromycin]    Review of Systems   Review of Systems  Physical Exam Updated Vital Signs BP (!) 135/95   Pulse 82   Temp 98.5 F (36.9 C) (Oral)   Resp 16   SpO2 99%  Physical Exam Vitals and nursing note reviewed.  Chest:     Chest wall: No tenderness.  Abdominal:     Tenderness: There is no abdominal tenderness.  Musculoskeletal:        General: Tenderness present.      Cervical back: Neck supple.     Comments: Tenderness to right hip laterally.  Tenderness to right knee anteriorly with some ecchymosis.  No step-off.  No large effusion.  No ankle tenderness.  Skin:    Capillary Refill: Capillary refill takes less than 2 seconds.  Neurological:     Mental Status: She is alert and oriented to person, place, and time.     ED Results / Procedures / Treatments   Labs (all labs ordered are listed, but only abnormal results are displayed) Labs Reviewed - No data to display  EKG None  Radiology DG Hip Unilat W or Wo Pelvis 2-3 Views Right Result Date: 05/09/2023 CLINICAL DATA:  Fall EXAM: DG HIP (WITH OR WITHOUT PELVIS) 2-3V RIGHT COMPARISON:  None Available. FINDINGS: There is no evidence of hip fracture or dislocation. There is no evidence of arthropathy or other focal bone abnormality. IMPRESSION: Negative. Electronically Signed   By: Jasmine Pang M.D.   On: 05/09/2023 19:16   DG Knee Complete 4 Views Right Result Date: 05/09/2023 CLINICAL DATA:  Fall difficulty ambulating EXAM: RIGHT KNEE - COMPLETE 4+ VIEW COMPARISON:  09/24/2022 FINDINGS: No evidence of fracture, dislocation, or joint effusion. No evidence of arthropathy or other focal bone abnormality. Soft tissues are unremarkable. IMPRESSION: Negative. Electronically Signed   By: Jasmine Pang M.D.   On: 05/09/2023 19:14    Procedures Procedures    Medications Ordered in ED Medications  ondansetron (ZOFRAN-ODT) disintegrating tablet 4 mg (4 mg Oral Given 05/09/23 1708)  ketorolac (TORADOL) 15 MG/ML injection 15 mg (15 mg Intramuscular Given 05/09/23 1837)    ED Course/ Medical Decision Making/ A&P                                 Medical Decision Making Amount and/or Complexity of Data Reviewed Radiology: ordered.  Risk Prescription drug management.   Patient with fall.  Complaining of pain down back of right leg.  Pain in knee and hip.  Will get x-ray imaging of knee and hip to  evaluate for causes such as fractures or dislocations. Reviewed previous MRI of the knee.  Knee x-ray and hip x-ray reassuring.  Ended pendantly interpreted by me and showed no fracture.  Will immobilize knee.  Has crutches at home.  Give some pain medicine.  Follow-up with Dr. Steward Drone, who she is seen previously        Final Clinical Impression(s) / ED Diagnoses Final diagnoses:  Fall, initial encounter  Sprain of knee, unspecified laterality, unspecified ligament, initial encounter    Rx / DC Orders  ED Discharge Orders          Ordered    oxyCODONE-acetaminophen (PERCOCET/ROXICET) 5-325 MG tablet  Every 8 hours PRN        05/09/23 1956              Benjiman Core, MD 05/09/23 1956

## 2023-05-09 NOTE — ED Notes (Addendum)
Previous note entered in error. 

## 2023-05-09 NOTE — ED Triage Notes (Addendum)
Slipped on area rug. Landed on right knee-difficult to bend. Feels like she pulled left leg/groin. Has not been able to ambulate. Some nausea with the pain.

## 2023-05-09 NOTE — ED Notes (Signed)
Pt taken to Xray.

## 2023-05-10 ENCOUNTER — Ambulatory Visit: Payer: Medicaid Other | Admitting: Internal Medicine
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
## 2023-05-13 ENCOUNTER — Ambulatory Visit: Payer: Medicaid Other | Admitting: Obstetrics

## 2023-05-13 ENCOUNTER — Other Ambulatory Visit (HOSPITAL_COMMUNITY)
Admission: RE | Admit: 2023-05-13 | Discharge: 2023-05-13 | Disposition: A | Discharge status: Home / Self Care | Payer: Medicaid Other | Source: Other Acute Inpatient Hospital | Attending: Obstetrics | Admitting: Obstetrics

## 2023-05-13 ENCOUNTER — Encounter: Payer: Self-pay | Admitting: Obstetrics

## 2023-05-13 VITALS — BP 116/83 | HR 107 | Ht 61.0 in | Wt 194.6 lb

## 2023-05-13 DIAGNOSIS — R7303 Prediabetes: Secondary | ICD-10-CM

## 2023-05-13 DIAGNOSIS — R102 Pelvic and perineal pain: Secondary | ICD-10-CM | POA: Diagnosis not present

## 2023-05-13 DIAGNOSIS — R3916 Straining to void: Secondary | ICD-10-CM

## 2023-05-13 DIAGNOSIS — F411 Generalized anxiety disorder: Secondary | ICD-10-CM

## 2023-05-13 DIAGNOSIS — R8290 Unspecified abnormal findings in urine: Secondary | ICD-10-CM | POA: Diagnosis present

## 2023-05-13 DIAGNOSIS — R82998 Other abnormal findings in urine: Secondary | ICD-10-CM | POA: Diagnosis present

## 2023-05-13 DIAGNOSIS — N94819 Vulvodynia, unspecified: Secondary | ICD-10-CM

## 2023-05-13 DIAGNOSIS — N39492 Postural (urinary) incontinence: Secondary | ICD-10-CM

## 2023-05-13 DIAGNOSIS — K582 Mixed irritable bowel syndrome: Secondary | ICD-10-CM

## 2023-05-13 DIAGNOSIS — R351 Nocturia: Secondary | ICD-10-CM | POA: Diagnosis not present

## 2023-05-13 HISTORY — DX: Straining to void: R39.16

## 2023-05-13 HISTORY — DX: Unspecified abnormal findings in urine: R82.90

## 2023-05-13 LAB — URINALYSIS, COMPLETE (UACMP) WITH MICROSCOPIC
Bilirubin Urine: NEGATIVE
Glucose, UA: NEGATIVE mg/dL
Ketones, ur: NEGATIVE mg/dL
Nitrite: NEGATIVE
Protein, ur: NEGATIVE mg/dL
Specific Gravity, Urine: 1.011 (ref 1.005–1.030)
pH: 6 (ref 5.0–8.0)

## 2023-05-13 LAB — POCT URINALYSIS DIPSTICK
Bilirubin, UA: NEGATIVE
Glucose, UA: NEGATIVE
Ketones, UA: NEGATIVE
Nitrite, UA: NEGATIVE
Protein, UA: NEGATIVE
Spec Grav, UA: 1.015 (ref 1.010–1.025)
Urobilinogen, UA: 0.2 U/dL
pH, UA: 6.5 (ref 5.0–8.0)

## 2023-05-13 MED ORDER — LIDOCAINE 5 % EX OINT: 1.0000 | TOPICAL_OINTMENT | Freq: Three times a day (TID) | CUTANEOUS | 0 refills | Status: DC | PRN

## 2023-05-13 MED ORDER — PHENAZOPYRIDINE HCL 200 MG PO TABS: 200.0000 mg | ORAL_TABLET | Freq: Three times a day (TID) | ORAL | 0 refills | Status: DC | PRN

## 2023-05-13 NOTE — Patient Instructions (Addendum)
The origin of pelvic floor muscle spasm can be multifactorial, including primary, reactive to a different pain source, trauma, or even part of a centralized pain syndrome.Treatment options include pelvic floor physical therapy, local (vaginal) or oral  muscle relaxants, pelvic muscle trigger point injections or centrally acting pain medications.     Mixed Incontinence (MUI):  MUI includes symptoms of both Overactive bladder (OAB) and stress incontinence (SUI).    Overactive bladder (OAB) causes bladder urgency, frequency, and having to void at night with or without leakage.  Several  treatment options exist, including behavioral changes (avoiding caffeine, etc), physical therapy, medications, and neuromodulation (ways to change the nerve signals to the bladder).   Stress incontinence (SUI) causes urinary leakage with coughing, laughing, sneezing and occasionally during exercise or bending/lifting.  Treatment options include nonsurgical options such as Kegel (pelvic floor) exercises, physical therapy, pessary (vaginal device similar to a diaphragm to prevent leakage) or surgical options such as a midurethral sling.   For night time frequency: - avoid fluid intake after 6pm - elevated your feet during the day or use compression socks to reduce lower extremity swelling  Women should try to eat at least 21 to 25 grams of fiber a day, while men should aim for 30 to 38 grams a day. You can add fiber to your diet with food or a fiber supplement such as psyllium (metamucil), benefiber, or fibercon.   Here's a look at how much dietary fiber is found in some common foods. When buying packaged foods, check the Nutrition Facts label for fiber content. It can vary among brands.  Fruits Serving size Total fiber (grams)*  Raspberries 1 cup 8.0  Pear 1 medium 5.5  Apple, with skin 1 medium 4.5  Banana 1 medium 3.0  Orange 1 medium 3.0  Strawberries 1 cup 3.0   Vegetables Serving size Total fiber (grams)*   Green peas, boiled 1 cup 9.0  Broccoli, boiled 1 cup chopped 5.0  Turnip greens, boiled 1 cup 5.0  Brussels sprouts, boiled 1 cup 4.0  Potato, with skin, baked 1 medium 4.0  Sweet corn, boiled 1 cup 3.5  Cauliflower, raw 1 cup chopped 2.0  Carrot, raw 1 medium 1.5   Grains Serving size Total fiber (grams)*  Spaghetti, whole-wheat, cooked 1 cup 6.0  Barley, pearled, cooked 1 cup 6.0  Bran flakes 3/4 cup 5.5  Quinoa, cooked 1 cup 5.0  Oat bran muffin 1 medium 5.0  Oatmeal, instant, cooked 1 cup 5.0  Popcorn, air-popped 3 cups 3.5  Brown rice, cooked 1 cup 3.5  Bread, whole-wheat 1 slice 2.0  Bread, rye 1 slice 2.0   Legumes, nuts and seeds Serving size Total fiber (grams)*  Split peas, boiled 1 cup 16.0  Lentils, boiled 1 cup 15.5  Black beans, boiled 1 cup 15.0  Baked beans, canned 1 cup 10.0  Chia seeds 1 ounce 10.0  Almonds 1 ounce (23 nuts) 3.5  Pistachios 1 ounce (49 nuts) 3.0  Sunflower kernels 1 ounce 3.0  *Rounded to nearest 0.5 gram. Source: [employer] for [employer]	[place]

## 2023-05-13 NOTE — Assessment & Plan Note (Signed)
-   POCT UA + leuk/heme, pending UA microscopy and culture - prior UTI symptoms described as suprapubic pressure and pain. Denies dysuria, hematuria, increased frequency/urgency. Discussed need for urine testing to r/o infection and minimize antibiotic exposure - consider bladder pain syndrome due to history of migraine, IBS and mild reproducible bladder pain with palpation

## 2023-05-13 NOTE — Assessment & Plan Note (Signed)
-   provided handout regarding vulvodynia, reviewed comfort measures and treatment options.  - Rx topical lidocaine 1g PRN pain up to 3x/day - consider Rx Amitriptyline 2.5%/ gabapentin 2.5%/ baclofen 2.5% in vaginal cream if refractory symptoms. - lubrication use during intercourse - pending pelvic floor PT appointment

## 2023-05-13 NOTE — Assessment & Plan Note (Signed)
-   discussed association of pelvic pain and pelvic floor dysfunction with mood disorder - encouraged pt to follow-up with psych due to history of trauma and continue meditation  - encouraged stress management and reviewed emotional triggers with worsened pelvic floor dysfunction and pain

## 2023-05-13 NOTE — Assessment & Plan Note (Addendum)
-   cycles between constipation and diarrhea - For constipation, we reviewed the importance of a better bowel regimen.  We also discussed the importance of avoiding chronic straining, as it can exacerbate her pelvic floor symptoms; we discussed treating constipation and straining prior to surgery, as postoperative straining can lead to damage to the repair and recurrence of symptoms. We discussed initiating therapy with increasing fluid intake, fiber supplementation, stool softeners, and laxatives such as miralax.  - encouraged titration of fiber supplementation - referral for pelvic floor PT - consider IBGuard

## 2023-05-13 NOTE — Assessment & Plan Note (Signed)
-   previously HbA1C 6 in 2003 increased to 8.1 on 02/21/23 consistent with DM on metformin - discussed association of urinary leakage with worsened glycemic control - encouraged follow-up with PCP regarding blood sugar control and treatment of DM - encouraged weight loss, reports history of bulimia  - limited ability to exercise due to bilateral knee pain
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
## 2023-05-13 NOTE — Assessment & Plan Note (Addendum)
-   bladder scan WNL at 73mL - patient unclear if it is urinary leakage vs. Vaginal discharge at times, Rx pyridium and reviewed pyridium pad test. - encouraged timed voids and reduction of fluid intake. Explained that optimization of glycemic control can reduce thirst and also improve urinary leakage symptoms - We discussed the symptoms of overactive bladder (OAB), which include urinary urgency, urinary frequency, nocturia, with or without urge incontinence.  While we do not know the exact etiology of OAB, several treatment options exist. We discussed management including behavioral therapy (decreasing bladder irritants, urge suppression strategies, timed voids, bladder retraining), physical therapy, medication; for refractory cases posterior tibial nerve stimulation, sacral neuromodulation, and intravesical botulinum toxin injection.  For Beta-3 agonist medication, we discussed the potential side effect of elevated blood pressure which is more likely to occur in individuals with uncontrolled hypertension. Consider if refractory after pelvic floor PT - referral to pelvic floor PT

## 2023-05-13 NOTE — Assessment & Plan Note (Addendum)
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 - The origin of pelvic floor muscle spasm can be multifactorial, including primary, reactive to a different pain source, trauma, or even part of a centralized pain syndrome.Treatment options include pelvic floor physical therapy, local (vaginal) or oral  muscle relaxants, pelvic muscle trigger point injections or centrally acting pain medications.   - referral for pelvic floor PT - avoid Kegel exercises, desires to start dilator use. Reviewed instructions with handout provided - Rx topical lidocaine for use prior to pelvic floor PT

## 2023-05-13 NOTE — Assessment & Plan Note (Signed)
-   avoid fluid intake after 6pm - elevated feet during the day or use compression socks to reduce lower extremity swelling - reports minimal snoring, consider workup for sleep apnea if refractory symptoms

## 2023-05-14 LAB — URINE CULTURE

## 2023-05-17 ENCOUNTER — Ambulatory Visit (INDEPENDENT_AMBULATORY_CARE_PROVIDER_SITE_OTHER): Payer: Medicaid Other | Admitting: Internal Medicine

## 2023-05-17 ENCOUNTER — Encounter: Payer: Self-pay | Admitting: Internal Medicine

## 2023-05-17 ENCOUNTER — Other Ambulatory Visit: Payer: Self-pay

## 2023-05-17 VITALS — BP 120/68 | HR 91 | Temp 98.7°F | Resp 14 | Ht 61.0 in | Wt 192.9 lb

## 2023-05-17 DIAGNOSIS — J302 Other seasonal allergic rhinitis: Secondary | ICD-10-CM

## 2023-05-17 DIAGNOSIS — L508 Other urticaria: Secondary | ICD-10-CM

## 2023-05-17 DIAGNOSIS — J3089 Other allergic rhinitis: Secondary | ICD-10-CM

## 2023-05-17 DIAGNOSIS — J4520 Mild intermittent asthma, uncomplicated: Secondary | ICD-10-CM | POA: Diagnosis not present

## 2023-05-17 MED ORDER — ALBUTEROL SULFATE HFA 108 (90 BASE) MCG/ACT IN AERS: 2.0000 | INHALATION_SPRAY | Freq: Four times a day (QID) | RESPIRATORY_TRACT | 1 refills | Status: DC | PRN

## 2023-05-17 MED ORDER — FLUTICASONE PROPIONATE 50 MCG/ACT NA SUSP: 2.0000 | Freq: Every day | NASAL | 5 refills | Status: DC

## 2023-05-17 MED ORDER — LEVOCETIRIZINE DIHYDROCHLORIDE 5 MG PO TABS: 5.0000 mg | ORAL_TABLET | Freq: Two times a day (BID) | ORAL | 5 refills | Status: DC | PRN

## 2023-05-17 MED ORDER — AZELASTINE HCL 0.1 % NA SOLN: 2.0000 | Freq: Two times a day (BID) | NASAL | 5 refills | Status: DC | PRN

## 2023-05-17 NOTE — Progress Notes (Signed)
FOLLOW UP Date of Service/Encounter:  05/17/23   Subjective:  Alyssa Bennett (DOB: 09-Jun-2000) is a 22 y.o. female who [facility] for follow up for urticaria, allergic rhinitis, asthma.   History obtained from: chart review and patient. Last visit was on 11/8 for skin testing.  Positive to multiple allergens, started on Flonase, xyzal.    Since last visit, only has had hives about 1-2x and had to increase the Xyzal dose temporarily.  Otherwise no issues. Did not need Pepcid.  She is having trouble with her allergies and also notes contributing to her migraines.  Has a bad headache currently.  Also having lots of congestion, drainage, runny nose.  Taking Flonase/Xyzal daily.   Also reports a hx of mild asthma, mostly exercise induced.  Rarely needs Albuterol.  Has noticed recently that she randomly gets short of breath and feels like gasping for air.  No choking/coughing.  Feels like her lungs just forget to breath.  No anxiety/palpitations.  Has not tried her inhaler.   Past Medical History: Past Medical History:  Diagnosis Date   ADHD (attention deficit hyperactivity disorder)    Allergies    Angio-edema    Asthma    Atypical chest pain 12/25/2021   Atypical chest pain 12/25/2021   Hives    Migraine    Ovarian cyst    Stabbing headache 11/09/2021   Urticaria     Objective:  BP 120/68 (BP Location: Right Arm, Patient Position: Sitting, Cuff Size: Normal)   Pulse 91   Temp 98.7 F (37.1 C) (Temporal)   Resp 14   Ht 5\' 1"  (1.549 m)   Wt 192 lb 14.4 oz (87.5 kg)   SpO2 94%   BMI 36.45 kg/m  Body mass index is 36.45 kg/m. Physical Exam: GEN: alert, well developed HEENT: clear conjunctiva, nose with mild inferior turbinate hypertrophy, pink nasal mucosa, + clear rhinorrhea, no cobblestoning HEART: regular rate and rhythm, no murmur LUNGS: clear to auscultation bilaterally, no coughing, unlabored respiration SKIN: no rashes  or lesions  Assessment:   1. Seasonal and perennial allergic rhinitis   2. Chronic urticaria   3. Mild intermittent asthma without complication     Plan/Recommendations:   Urticaria (Hives): - Controlled  - At this time etiology of hives and swelling is unknown. Hives can be caused by a variety of different triggers including illness/infection, exercise, pressure, vibrations, extremes of temperature to name a few however majority of the time there is no identifiable trigger.  - Use Xyzal 5mg  daily.  -If no improvement in 2-3 days, increase to Xyzal 5mg  twice daily.  -If no improvement in 2-3 days, add Pepcid 20mg  twice daily and continue Xyzal 5mg  twice daily.  Allergic Rhinitis:  - Uncontrolled, will add Azelastine and consider AIT.  - SPT 03/2023: grasses, weeds, dust mites, tobacco leaf  - Use nasal saline rinses before nose sprays such as with Neilmed Sinus Rinse.  Use distilled water.   - Use Flonase 2 sprays each nostril daily. Aim upward and outward. - Use Azelastine 1-2 sprays each nostril twice daily as needed for congestion, drainage, sneezing, runny nose.  Aim upward and outward. - Use Xyzal 5mg  daily.  - Consider allergy shots as long term control of your symptoms by teaching your immune system to be more tolerant of your allergy triggers   Mild Intermittent Asthma: Gasping/Dyspnea - I am not convinced random episodes of gasping/dyspnea are asthma.  Will keep VCD  in differential.  Will have her keep Albuterol use PRN to see if it helps.  - Will do spirometry at next visit.  - Rescue inhaler: Albuterol 2 puffs every 4-6 hours as needed for respiratory symptoms of cough, shortness of breath, or wheezing Asthma control goals:  Full participation in all desired activities (may need albuterol before activity) Albuterol use two times or less a week on average (not counting use with activity) Cough interfering with sleep two times or less a month Oral steroids no more than  once a year No hospitalizations    Return in about 2 months (around 07/18/2023).  Alesia Morin, MD [facility]

## 2023-05-17 NOTE — Patient Instructions (Addendum)
Urticaria (Hives): - Controlled  - At this time etiology of hives and swelling is unknown. Hives can be caused by a variety of different triggers including illness/infection, exercise, pressure, vibrations, extremes of temperature to name a few however majority of the time there is no identifiable trigger.  - Use Xyzal 5mg  daily.  -If no improvement in 2-3 days, increase to Xyzal 5mg  twice daily.  -If no improvement in 2-3 days, add Pepcid 20mg  twice daily and continue Xyzal 5mg  twice daily.  Allergic Rhinitis:  - Uncontrolled, will add Azelastine and consider AIT.  - SPT 03/2023: grasses, weeds, dust mites, tobacco leaf  - Use nasal saline rinses before nose sprays such as with Neilmed Sinus Rinse.  Use distilled water.   - Use Flonase 2 sprays each nostril daily. Aim upward and outward. - Use Azelastine 1-2 sprays each nostril twice daily as needed for congestion, drainage, sneezing, runny nose.  Aim upward and outward. - Use Xyzal 5mg  daily.  - Consider allergy shots as long term control of your symptoms by teaching your immune system to be more tolerant of your allergy triggers   Mild Intermittent Asthma: Gasping/Dyspnea - I am not convinced random episodes of gasping/dyspnea are asthma.  Will have her keep Albuterol use PRN to see if it helps.  - Will do spirometry at next visit.  - Rescue inhaler: Albuterol 2 puffs every 4-6 hours as needed for respiratory symptoms of cough, shortness of breath, or wheezing Asthma control goals:  Full participation in all desired activities (may need albuterol before activity) Albuterol use two times or less a week on average (not counting use with activity) Cough interfering with sleep two times or less a month Oral steroids no more than once a year No hospitalizations

## 2023-05-30 ENCOUNTER — Ambulatory Visit (INDEPENDENT_AMBULATORY_CARE_PROVIDER_SITE_OTHER): Payer: Medicaid Other | Admitting: Nurse Practitioner

## 2023-05-30 ENCOUNTER — Encounter: Payer: Self-pay | Admitting: Nurse Practitioner

## 2023-05-30 VITALS — BP 126/82 | HR 105 | Wt 195.8 lb

## 2023-05-30 DIAGNOSIS — N946 Dysmenorrhea, unspecified: Secondary | ICD-10-CM

## 2023-05-30 DIAGNOSIS — E1169 Type 2 diabetes mellitus with other specified complication: Secondary | ICD-10-CM

## 2023-05-30 DIAGNOSIS — F988 Other specified behavioral and emotional disorders with onset usually occurring in childhood and adolescence: Secondary | ICD-10-CM | POA: Diagnosis not present

## 2023-05-30 DIAGNOSIS — R7303 Prediabetes: Secondary | ICD-10-CM

## 2023-05-30 DIAGNOSIS — D72828 Other elevated white blood cell count: Secondary | ICD-10-CM

## 2023-05-30 DIAGNOSIS — E538 Deficiency of other specified B group vitamins: Secondary | ICD-10-CM

## 2023-05-30 DIAGNOSIS — Z6837 Body mass index (BMI) 37.0-37.9, adult: Secondary | ICD-10-CM | POA: Diagnosis not present

## 2023-05-30 DIAGNOSIS — E282 Polycystic ovarian syndrome: Secondary | ICD-10-CM

## 2023-05-30 DIAGNOSIS — K089 Disorder of teeth and supporting structures, unspecified: Secondary | ICD-10-CM

## 2023-05-30 DIAGNOSIS — K760 Fatty (change of) liver, not elsewhere classified: Secondary | ICD-10-CM

## 2023-05-30 MED ORDER — NORETHIN ACE-ETH ESTRAD-FE 1-20 MG-MCG PO TABS: 1.0000 | ORAL_TABLET | Freq: Every day | ORAL | 4 refills | Status: DC

## 2023-05-30 MED ORDER — AMPHETAMINE-DEXTROAMPHETAMINE 20 MG PO TABS: 20.0000 mg | ORAL_TABLET | Freq: Two times a day (BID) | ORAL | 0 refills | Status: DC

## 2023-05-30 MED ORDER — WEGOVY 0.25 MG/0.5ML ~~LOC~~ SOAJ: 0.2500 mg | SUBCUTANEOUS | 0 refills | Status: DC

## 2023-05-30 NOTE — Progress Notes (Signed)
 Catheline Doing, DNP, AGNP-c [facility]  [address] [place], [place] [address] [phone]  ESTABLISHED PATIENT- Chronic Health and/or Follow-Up Visit  Blood pressure 126/82, pulse (!) 105, weight 195 lb 12.8 oz (88.8 kg).    Alyssa Bennett is a 23 y.o. year old female presenting today for evaluation and management of chronic conditions.  History of Present Illness The patient, with a history of ADHD, presents with concerns about her current medication regimen. She reports running out of her ADD medication due to insurance approval issues, which has led to increased difficulty in managing her symptoms. 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 The patient has previously tried a combination of Vyvanse  and Ritalin , as well as Armodafinil . Thus far the only treatment that has been effective is Armodafinil , but insurance won't cover this for ADHD. She has not yet tried Adderall.   In addition to her ADHD, the patient also has a history of irregular menstruation and has been on Metformin . She reports that the Metformin  sometimes causes her to feel excessively hot, to the point of nearly passing out. She has not had a regular menstrual cycle since starting the medication and has run out of her birth control.  The patient also reports difficulty with weight loss, despite dietary changes and increased physical activity. She expresses interest in trying Ozempic  or Wegovy  for weight management, as she has heard from others that these medications can be effective.  The patient also mentions frequent urination and a sensation of urinating on herself, which she believes may be due to tension in her pelvic floor. She has been advised to see a physical therapist for this issue.  Lastly, the patient reports having a fatty liver, as revealed by recent liver imaging. She expresses  concern about this and questions why fat is building up around her liver. She also mentions wanting to get her wisdom teeth removed and needing a referral to a dentist and an [employer]. She has been trying to manage her various health issues and expresses a desire for 'personal growth and health.'  All ROS negative with exception of what is listed above.   PHYSICAL EXAM Physical Exam Vitals and nursing note reviewed.  Constitutional:      Appearance: Normal appearance.  HENT:     Head: Normocephalic.  Eyes:     Pupils: Pupils are equal, round, and reactive to light.  Cardiovascular:     Rate and Rhythm: Normal rate and regular rhythm.     Pulses: Normal pulses.     Heart sounds: Normal heart sounds.  Pulmonary:     Effort: Pulmonary effort is normal.     Breath sounds: Normal breath sounds.  Musculoskeletal:        General: Normal range of motion.     Cervical back: Normal range of motion.  Skin:    General: Skin is warm.  Neurological:     General: No focal deficit present.     Mental Status: She is alert and oriented to person, place, and time.  Psychiatric:        Mood and Affect: Mood normal.      PLAN Problem List Items Addressed This Visit     Attention deficit disorder (ADD) without hyperactivity - Primary   Reports running out of ADHD medication and difficulty obtaining approval for previous medications. Tried Vyvanse , Ritalin , and armodafinil , but not Adderall. Discussed potential benefits  of Adderall XR and option to split dose to find optimal regimen. Informed about immediate effects and ability to adjust dosage based on daily needs. - Prescribe Adderall XR 20 mg, instruct to split dose if needed (10 mg in the morning, 10 mg in the afternoon)      Relevant Medications   amphetamine -dextroamphetamine  (ADDERALL) 20 MG tablet   norethindrone-ethinyl estradiol -FE (JUNEL FE 1/20) 1-20 MG-MCG tablet   Other Relevant Orders   Hemoglobin A1c (Completed)   CBC with  Differential/Platelet (Completed)   Comprehensive metabolic panel (Completed)   BMI 37.0-37.9, adult   Unable to lose weight despite dietary and lifestyle changes. Discussed use of Wegovy  for weight management, which is effective and covered by insurance. Informed about potential side effects, including nausea if overeating, and proper dietary habits to avoid nausea. - Prescribe Wegovy , one injection per week - Provide education on dietary habits and portion control to avoid nausea      Relevant Medications   Semaglutide -Weight Management (WEGOVY ) 0.25 MG/0.5ML SOAJ   norethindrone-ethinyl estradiol -FE (JUNEL FE 1/20) 1-20 MG-MCG tablet   Other Relevant Orders   Hemoglobin A1c (Completed)   CBC with Differential/Platelet (Completed)   Comprehensive metabolic panel (Completed)   Fatty liver   Liver imaging shows fatty liver, likely due to elevated BMI. Discussed importance of weight loss for managing fatty liver disease. Informed about Wegovy 's effectiveness in weight management and potential benefits for liver health. - Prescribe Wegovy  for weight management - Monitor liver function tests      Relevant Medications   norethindrone-ethinyl estradiol -FE (JUNEL FE 1/20) 1-20 MG-MCG tablet   Other Relevant Orders   Hemoglobin A1c (Completed)   CBC with Differential/Platelet (Completed)   Comprehensive metabolic panel (Completed)   Vitamin B 12 deficiency   Reports only receiving one B12 injection despite needing monthly injections. Discussed importance of regular B12 supplementation. - Send prescription for monthly B12 injections for six months       Pre-diabetes   Relevant Medications   Semaglutide -Weight Management (WEGOVY ) 0.25 MG/0.5ML SOAJ   norethindrone-ethinyl estradiol -FE (JUNEL FE 1/20) 1-20 MG-MCG tablet   Other Relevant Orders   Hemoglobin A1c (Completed)   CBC with Differential/Platelet (Completed)   Comprehensive metabolic panel (Completed)   Other Visit Diagnoses        Dysmenorrhea       Relevant Medications   norethindrone-ethinyl estradiol -FE (JUNEL FE 1/20) 1-20 MG-MCG tablet   Other Relevant Orders   Ambulatory referral to Obstetrics / Gynecology     Dental disorder       Relevant Orders   Ambulatory referral to Dentistry     PCOS (polycystic ovarian syndrome)       Relevant Orders   Ambulatory referral to Obstetrics / Gynecology       Return in about 3 months (around 08/28/2023) for Virtual- Check to see how meds are working (57m).  Catheline Doing, DNP, AGNP-c

## 2023-05-30 NOTE — Patient Instructions (Addendum)
 I have sent in Wegovy  for you to try to help with weight management. Be sure that you are eating at least 3 meals a day and include protein and carbohydrates.  If you can't eat a meal then try the protein shake to help keep you feeling good.  Keep working on cutting back your soda to reduce the caffeine intake.   I have sent in the adderall 20mg . Try 10-20 mg dosing twice a day and see how you do with this. If this works, please let me know.   I have sent in the birth control continuous. Skip the placebo week to avoid hormone changes and development of cysts.     WEIGHT LOSS PLANNING Your progress today shows:     05/30/2023    3:25 PM 05/17/2023   12:00 PM 05/13/2023    2:01 PM  Vitals with BMI  Height  5' 1 5' 1  Weight 195 lbs 13 oz 192 lbs 14 oz 194 lbs 10 oz  BMI 37.02 36.47 36.79  Systolic 126 120 883  Diastolic 82 68 83  Pulse 105 91 107    For best management of weight, it is vital to balance intake versus output. This means the number of calories burned per day must be less than the calories you take in with food and drink.   I recommend trying to follow a diet with the following: Calories: 1200-1500 calories per day Carbohydrates: 150-180 grams of carbohydrates per day  Why: Gives your body enough quick fuel for cells to maintain normal function without sending them into starvation mode.  Protein: At least 90 grams of protein per day- 30 grams with each meal Why: Protein takes longer and uses more energy than carbohydrates to break down for fuel. The carbohydrates in your meals serves as quick energy sources and proteins help use some of that extra quick energy to break down to produce long term energy. This helps you not feel hungry as quickly and protein breakdown burns calories.  Water: Drink AT LEAST 64 ounces of water per day  Why: Water is essential to healthy metabolism. Water helps to fill the stomach and keep you fuller longer. Water is required for healthy  digestion and filtering of waste in the body.  Fat: Limit fats in your diet- when choosing fats, choose foods with lower fats content such as lean meats (chicken, fish, turkey).  Why: Increased fat intake leads to storage for later. Once you burn your carbohydrate energy, your body goes into fat and protein breakdown mode to help you loose weight.  Cholesterol: Fats and oils that are LIQUID at room temperature are best. Choose vegetable oils (olive oil, avocado oil, nuts). Avoid fats that are SOLID at room temperature (animal fats, processed meats). Healthy fats are often found in whole grains, beans, nuts, seeds, and berries.  Why: Elevated cholesterol levels lead to build up of cholesterol on the inside of your blood vessels. This will eventually cause the blood vessels to become hard and can lead to high blood pressure and damage to your organs. When the blood flow is reduced, but the pressure is high from cholesterol buildup, parts of the cholesterol can break off and form clots that can go to the brain or heart leading to a stroke or heart attack.  Fiber: Increase amount of SOLUBLE the fiber in your diet. This helps to fill you up, lowers cholesterol, and helps with digestion. Some foods high in soluble fiber are oats, peas, beans, apples,  carrots, barley, and citrus fruits.   Why: Fiber fills you up, helps remove excess cholesterol, and aids in healthy digestion which are all very important in weight management.   I recommend the following as a minimum activity routine: Purposeful walk or other physical activity at least 20 minutes every single day. This means purposefully taking a walk, jog, bike, swim, treadmill, elliptical, dance, etc.  This activity should be ABOVE your normal daily activities, such as walking at work. Goal exercise should be at least 150 minutes a week- work your way up to this.   Heart Rate: Your maximum exercise heart rate should be 220 - Your Age in Years. When  exercising, get your heart rate up, but avoid going over the maximum targeted heart rate.  60-70% of your maximum heart rate is where you tend to burn the most fat. To find this number:  220 - Age In Years= Max HR  Max HR x 0.6 (or 0.7) = Fat Burning HR The Fat Burning HR is your goal heart rate while working out to burn the most fat.  NEVER exercise to the point your feel lightheaded, weak, nauseated, dizzy. If you experience ANY of these symptoms- STOP exercise! Allow yourself to cool down and your heart rate to come down. Then restart slower next time.  If at ANY TIME you feel chest pain or chest pressure during exercise, STOP IMMEDIATELY and seek medical attention.

## 2023-05-31 LAB — COMPREHENSIVE METABOLIC PANEL
ALT: 33 [IU]/L — ABNORMAL HIGH (ref 0–32)
AST: 29 [IU]/L (ref 0–40)
Albumin: 4.2 g/dL (ref 4.0–5.0)
Alkaline Phosphatase: 106 [IU]/L (ref 44–121)
BUN/Creatinine Ratio: 18 (ref 9–23)
BUN: 11 mg/dL (ref 6–20)
Bilirubin Total: 0.2 mg/dL (ref 0.0–1.2)
CO2: 23 mmol/L (ref 20–29)
Calcium: 9.4 mg/dL (ref 8.7–10.2)
Chloride: 98 mmol/L (ref 96–106)
Creatinine, Ser: 0.6 mg/dL (ref 0.57–1.00)
Globulin, Total: 3 g/dL (ref 1.5–4.5)
Glucose: 201 mg/dL — ABNORMAL HIGH (ref 70–99)
Potassium: 4.3 mmol/L (ref 3.5–5.2)
Sodium: 134 mmol/L (ref 134–144)
Total Protein: 7.2 g/dL (ref 6.0–8.5)
eGFR: 129 mL/min/{1.73_m2} (ref >59)

## 2023-05-31 LAB — CBC WITH DIFFERENTIAL/PLATELET
Basophils Absolute: 0 10*3/uL (ref 0.0–0.2)
Basos: 0 %
EOS (ABSOLUTE): 0.3 10*3/uL (ref 0.0–0.4)
Eos: 2 %
Hematocrit: 43.7 % (ref 34.0–46.6)
Hemoglobin: 14.3 g/dL (ref 11.1–15.9)
Immature Grans (Abs): 0 10*3/uL (ref 0.0–0.1)
Immature Granulocytes: 0 %
Lymphocytes Absolute: 2.8 10*3/uL (ref 0.7–3.1)
Lymphs: 22 %
MCH: 30 pg (ref 26.6–33.0)
MCHC: 32.7 g/dL (ref 31.5–35.7)
MCV: 92 fL (ref 79–97)
Monocytes Absolute: 0.8 10*3/uL (ref 0.1–0.9)
Monocytes: 6 %
Neutrophils Absolute: 8.9 10*3/uL — ABNORMAL HIGH (ref 1.4–7.0)
Neutrophils: 70 %
Platelets: 299 10*3/uL (ref 150–450)
RBC: 4.77 x10E6/uL (ref 3.77–5.28)
RDW: 11.8 % (ref 11.7–15.4)
WBC: 12.9 10*3/uL — ABNORMAL HIGH (ref 3.4–10.8)

## 2023-05-31 LAB — HEMOGLOBIN A1C
Est. average glucose Bld gHb Est-mCnc: 157 mg/dL
Hgb A1c MFr Bld: 7.1 % — ABNORMAL HIGH (ref 4.8–5.6)

## 2023-06-01 ENCOUNTER — Other Ambulatory Visit: Payer: Self-pay

## 2023-06-01 DIAGNOSIS — R7303 Prediabetes: Secondary | ICD-10-CM

## 2023-06-03 ENCOUNTER — Encounter: Payer: Self-pay | Admitting: Nurse Practitioner

## 2023-06-03 DIAGNOSIS — E538 Deficiency of other specified B group vitamins: Secondary | ICD-10-CM | POA: Insufficient documentation

## 2023-06-03 NOTE — Assessment & Plan Note (Signed)
 Liver imaging shows fatty liver, likely due to elevated BMI. Discussed importance of weight loss for managing fatty liver disease. Informed about Wegovy 's effectiveness in weight management and potential benefits for liver health. - Prescribe Wegovy  for weight management - Monitor liver function tests

## 2023-06-03 NOTE — Assessment & Plan Note (Signed)
 Unable to lose weight despite dietary and lifestyle changes. Discussed use of Wegovy  for weight management, which is effective and covered by insurance. Informed about potential side effects, including nausea if overeating, and proper dietary habits to avoid nausea. - Prescribe Wegovy , one injection per week - Provide education on dietary habits and portion control to avoid nausea

## 2023-06-03 NOTE — Assessment & Plan Note (Signed)
 Reports running out of ADHD medication and difficulty obtaining approval for previous medications. Tried Vyvanse , Ritalin , and armodafinil , but not Adderall. Discussed potential benefits of Adderall XR and option to split dose to find optimal regimen. Informed about immediate effects and ability to adjust dosage based on daily needs. - Prescribe Adderall XR 20 mg, instruct to split dose if needed (10 mg in the morning, 10 mg in the afternoon)

## 2023-06-03 NOTE — Assessment & Plan Note (Signed)
 Reports only receiving one B12 injection despite needing monthly injections. Discussed importance of regular B12 supplementation. - Send prescription for monthly B12 injections for six months

## 2023-06-07 ENCOUNTER — Encounter: Payer: Self-pay | Admitting: Nurse Practitioner

## 2023-06-07 DIAGNOSIS — K760 Fatty (change of) liver, not elsewhere classified: Secondary | ICD-10-CM

## 2023-06-07 DIAGNOSIS — Z6837 Body mass index (BMI) 37.0-37.9, adult: Secondary | ICD-10-CM

## 2023-06-07 DIAGNOSIS — E1169 Type 2 diabetes mellitus with other specified complication: Secondary | ICD-10-CM

## 2023-06-09 ENCOUNTER — Other Ambulatory Visit: Payer: Medicaid Other

## 2023-06-10 ENCOUNTER — Other Ambulatory Visit: Payer: Medicaid Other

## 2023-06-10 LAB — COMPREHENSIVE METABOLIC PANEL
AG Ratio: 1.6 (calc) (ref 1.0–2.5)
ALT: 22 U/L (ref 6–29)
AST: 15 U/L (ref 10–30)
Albumin: 4.3 g/dL (ref 3.6–5.1)
Alkaline phosphatase (APISO): 85 U/L (ref 31–125)
BUN: 11 mg/dL (ref 7–25)
CO2: 26 mmol/L (ref 20–32)
Calcium: 9 mg/dL (ref 8.6–10.2)
Chloride: 101 mmol/L (ref 98–110)
Creat: 0.61 mg/dL (ref 0.50–0.96)
Globulin: 2.7 g/dL (ref 1.9–3.7)
Glucose, Bld: 219 mg/dL — ABNORMAL HIGH (ref 65–99)
Potassium: 3.9 mmol/L (ref 3.5–5.3)
Sodium: 136 mmol/L (ref 135–146)
Total Bilirubin: 0.4 mg/dL (ref 0.2–1.2)
Total Protein: 7 g/dL (ref 6.1–8.1)

## 2023-06-10 LAB — MICROALBUMIN / CREATININE URINE RATIO
Creatinine, Urine: 65 mg/dL (ref 20–275)
Microalb, Ur: <0.2 mg/dL

## 2023-06-10 LAB — LIPID PANEL
Cholesterol: 201 mg/dL — ABNORMAL HIGH (ref <200)
HDL: 43 mg/dL — ABNORMAL LOW (ref >=50)
LDL Cholesterol (Calc): 123 mg/dL — ABNORMAL HIGH
Non-HDL Cholesterol (Calc): 158 mg/dL — ABNORMAL HIGH (ref <130)
Total CHOL/HDL Ratio: 4.7 (calc) (ref <5.0)
Triglycerides: 229 mg/dL — ABNORMAL HIGH (ref <150)

## 2023-06-15 ENCOUNTER — Ambulatory Visit: Payer: Medicaid Other | Admitting: "Endocrinology

## 2023-06-15 ENCOUNTER — Encounter: Payer: Self-pay | Admitting: "Endocrinology

## 2023-06-15 VITALS — BP 124/82 | HR 105 | Ht 61.0 in | Wt 191.2 lb

## 2023-06-15 DIAGNOSIS — E1165 Type 2 diabetes mellitus with hyperglycemia: Secondary | ICD-10-CM

## 2023-06-15 DIAGNOSIS — Z7984 Long term (current) use of oral hypoglycemic drugs: Secondary | ICD-10-CM | POA: Diagnosis not present

## 2023-06-15 DIAGNOSIS — E782 Mixed hyperlipidemia: Secondary | ICD-10-CM | POA: Diagnosis not present

## 2023-06-15 DIAGNOSIS — E1140 Type 2 diabetes mellitus with diabetic neuropathy, unspecified: Secondary | ICD-10-CM

## 2023-06-15 MED ORDER — DEXCOM G7 SENSOR MISC: 1.0000 | 0 refills | Status: DC

## 2023-06-15 MED ORDER — BLOOD GLUCOSE TEST VI STRP: 1.0000 | ORAL_STRIP | Freq: Three times a day (TID) | 3 refills | Status: AC

## 2023-06-15 MED ORDER — METFORMIN HCL ER 500 MG PO TB24: 1000.0000 mg | ORAL_TABLET | Freq: Two times a day (BID) | ORAL | 2 refills | Status: DC

## 2023-06-15 NOTE — Progress Notes (Signed)
Outpatient Endocrinology Note Alyssa Allen, MD  06/15/23   Alyssa Bennett 2000-08-24 [id number]  Referring Provider: Tollie Eth, NP Primary Care Provider: Tollie Eth, NP Reason for consultation: Subjective   Assessment & Plan  Alyssa "Nehemiah Settle" was seen today for diabetes.  Diagnoses and all orders for this visit:  Uncontrolled type 2 diabetes mellitus with hyperglycemia (HCC)  Mixed hypercholesterolemia and hypertriglyceridemia  Other orders -     metFORMIN (GLUCOPHAGE-XR) 500 MG 24 hr tablet; Take 2 tablets (1,000 mg total) by mouth 2 (two) times daily with a meal. -     Continuous Glucose Sensor (DEXCOM G7 SENSOR) MISC; 1 Device by Does not apply route continuous. -     Glucose Blood (BLOOD GLUCOSE TEST STRIPS) STRP; 1 each by In Vitro route in the morning, at noon, and at bedtime. Accucheck guide  May substitute to any manufacturer covered by patient's insurance.    Diabetes Type II complicated by neuropathy, No results found for: "GFR" Hba1c goal less than 7, current Hba1c is  Lab Results  Component Value Date   HGBA1C 7.1 (H) 05/30/2023   Will recommend the following: Poor tolerance to regular metformin due to "hot flashes"-on birth control   We will start you on an extended release version of metformin. Start with one 500 mg capsule taken every evening with dinner. Stay on this for 3-5 days, then increase to 1 tablet with breakfast and one with dinner. If you do not have any upset stomach, gradually increase to the goal dose of 2 capsules with breakfast and 2 capsules with dinner. If you do have upset stomach, decrease it back to the previous dose that you tolerated.  Week 1: one tablet after dinner Week 2: one tablet after breakfast and one after dinner  Week 3: one tablet after breakfast and two after dinner  Week 4 and onwards: two tablets after breakfast and two after dinner   No known contraindications/side effects to any of above  medications  -Last LD and Tg are as follows: Lab Results  Component Value Date   LDLCALC 123 (H) 06/09/2023    Lab Results  Component Value Date   TRIG 229 (H) 06/09/2023   -not on statin -Follow low fat diet and exercise   -Blood pressure goal <140/90 - Microalbumin/creatinine goal is < 30 -Last MA/Cr is as follows: Lab Results  Component Value Date   MICROALBUR <0.2 06/09/2023   -not on ACE/ARB  -diet changes including salt restriction -limit eating outside -counseled BP targets per standards of diabetes care -uncontrolled blood pressure can lead to retinopathy, nephropathy and cardiovascular and atherosclerotic heart disease  Reviewed and counseled on: -A1C target -Blood sugar targets -Complications of uncontrolled diabetes  -Checking blood sugar before meals and bedtime and bring log next visit -All medications with mechanism of action and side effects -Hypoglycemia management: rule of 15's, Glucagon Emergency Kit and medical alert ID -low-carb low-fat plate-method diet -At least 20 minutes of physical activity per day -Annual dilated retinal eye exam and foot exam -compliance and follow up needs -follow up as scheduled or earlier if problem gets worse  Call if blood sugar is less than 70 or consistently above 250    Take a 15 gm snack of carbohydrate at bedtime before you go to sleep if your blood sugar is less than 100.    If you are going to fast after midnight for a test or procedure, ask your physician for instructions on how to  reduce/decrease your insulin dose.    Call if blood sugar is less than 70 or consistently above 250  -Treating a low sugar by rule of 15  (15 gms of sugar every 15 min until sugar is more than 70) If you feel your sugar is low, test your sugar to be sure If your sugar is low (less than 70), then take 15 grams of a fast acting Carbohydrate (3-4 glucose tablets or glucose gel or 4 ounces of juice or regular soda) Recheck your sugar 15  min after treating low to make sure it is more than 70 If sugar is still less than 70, treat again with 15 grams of carbohydrate          Don't drive the hour of hypoglycemia  If unconscious/unable to eat or drink by mouth, use glucagon injection or nasal spray baqsimi and call 911. Can repeat again in 15 min if still unconscious.  Return in about 4 weeks (around 07/13/2023).   I have reviewed current medications, nurse's notes, allergies, vital signs, past medical and surgical history, family medical history, and social history for this encounter. Counseled patient on symptoms, examination findings, lab findings, imaging results, treatment decisions and monitoring and prognosis. The patient understood the recommendations and agrees with the treatment plan. All questions regarding treatment plan were fully answered.  Alyssa , MD  06/15/23   History of Present Illness Alyssa Bennett is a 23 y.o. year old female who presents for evaluation of Type II diabetes mellitus.  Alyssa Bennett was first diagnosed in 2024.   Diabetes education +  Home diabetes regimen: None  Metformin 500 mg every day   COMPLICATIONS -  MI/Stroke -  retinopathy +  neuropathy -  nephropathy  SYMPTOMS REVIEWED + Polyuria - Weight loss + Blurred vision  BLOOD SUGAR DATA Last checked a month ago  153-266  Physical Exam  BP 124/82   Pulse (!) 105   Ht 5\' 1"  (1.549 m)   Wt 191 lb 3.2 oz (86.7 kg)   SpO2 97%   BMI 36.13 kg/m    Constitutional: well developed, well nourished Head: normocephalic, atraumatic Eyes: sclera anicteric, no redness Neck: supple Lungs: normal respiratory effort Neurology: alert and oriented Skin: dry, no appreciable rashes Musculoskeletal: no appreciable defects Psychiatric: normal mood and affect Diabetic Foot Exam - Simple   No data filed      Current Medications Patient's Medications  New Prescriptions   CONTINUOUS GLUCOSE SENSOR (DEXCOM G7 SENSOR) MISC     1 Device by Does not apply route continuous.   GLUCOSE BLOOD (BLOOD GLUCOSE TEST STRIPS) STRP    1 each by In Vitro route in the morning, at noon, and at bedtime. Accucheck guide  May substitute to any manufacturer covered by patient's insurance.   METFORMIN (GLUCOPHAGE-XR) 500 MG 24 HR TABLET    Take 2 tablets (1,000 mg total) by mouth 2 (two) times daily with a meal.  Previous Medications   ACCU-CHEK SOFTCLIX LANCETS LANCETS    1 each by Other route 3 (three) times daily.   ALBUTEROL (VENTOLIN HFA) 108 (90 BASE) MCG/ACT INHALER    Inhale 2 puffs into the lungs every 6 (six) hours as needed.   AMPHETAMINE-DEXTROAMPHETAMINE (ADDERALL) 20 MG TABLET    Take 1 tablet (20 mg total) by mouth 2 (two) times daily.   AZELASTINE (ASTELIN) 0.1 % NASAL SPRAY    Place 2 sprays into both nostrils 2 (two) times daily as needed. Use in each  nostril as directed   BLOOD GLUCOSE MONITORING SUPPL DEVI    1 each by Does not apply route in the morning, at noon, and at bedtime. May substitute to any manufacturer covered by patient's insurance.   CLOTRIMAZOLE (LOTRIMIN) 1 % CREAM    Apply 1 Application topically as needed (To relieve hives).   DULOXETINE (CYMBALTA) 20 MG CAPSULE    Take 1 capsule (20 mg total) by mouth daily. Take for 1 week, then increase to one 30 mg capsule daily   ESOMEPRAZOLE (NEXIUM) 40 MG CAPSULE    One tab by mouth at dinner time.   FAMOTIDINE (PEPCID) 20 MG TABLET    Take 1 tablet (20 mg total) by mouth 2 (two) times daily as needed (hives).   FLUTICASONE (FLONASE) 50 MCG/ACT NASAL SPRAY    Place 2 sprays into both nostrils daily.   GLUCOSE BLOOD (ACCU-CHEK GUIDE) TEST STRIP    Check once daily   LEVOCETIRIZINE (XYZAL) 5 MG TABLET    Take 1 tablet (5 mg total) by mouth 2 (two) times daily as needed for allergies (or hives).   LIDOCAINE (XYLOCAINE) 5 % OINTMENT    Apply 1 Application topically 3 (three) times daily as needed. Peasize or 0.5g   NORETHINDRONE-ETHINYL ESTRADIOL-FE (JUNEL FE  1/20) 1-20 MG-MCG TABLET    Take 1 tablet by mouth daily. Take Continuously. Skip placebo week.   ONDANSETRON (ZOFRAN) 8 MG TABLET    Take 1 tablet (8 mg total) by mouth every 8 (eight) hours as needed for nausea or vomiting.   RIZATRIPTAN (MAXALT) 10 MG TABLET    Take 1 tablet (10 mg total) by mouth as needed for migraine. May repeat in 2 hours if needed   SEMAGLUTIDE-WEIGHT MANAGEMENT (WEGOVY) 0.25 MG/0.5ML SOAJ    Inject 0.25 mg into the skin once a week.   TRAZODONE (DESYREL) 50 MG TABLET    Take 0.5-2 tablets (25-100 mg total) by mouth at bedtime as needed for sleep.   VITAMIN D, ERGOCALCIFEROL, (DRISDOL) 1.25 MG (50000 UNIT) CAPS CAPSULE    Take 1 capsule (50,000 Units total) by mouth every 7 (seven) days.  Modified Medications   No medications on file  Discontinued Medications   No medications on file    Allergies Allergies  Allergen Reactions   Zithromax [Azithromycin] Itching and Rash    Past Medical History Past Medical History:  Diagnosis Date   Abnormal urinalysis 05/13/2023   Acute pain of left knee 02/02/2022   Acute right ankle pain 02/02/2022   ADHD (attention deficit hyperactivity disorder)    Allergies    Angio-edema    Asthma    Atypical chest pain 12/25/2021   Atypical chest pain 12/25/2021   Bulimia nervosa 12/31/2021   Dermatitis 07/09/2015   Full body hives 11/09/2021   Hives    Migraine    Ovarian cyst    Palpitations 12/04/2021   Stabbing headache 11/09/2021   Straining to void 05/13/2023   Tachycardia 07/08/2015   Urinary urgency 02/21/2023   Urticaria     Past Surgical History Past Surgical History:  Procedure Laterality Date   TONSILLECTOMY      Family History family history includes ADD / ADHD in her brother and mother; Allergic rhinitis in her brother, father, maternal grandfather, maternal grandmother, mother, paternal grandfather, paternal grandmother, and sister; Anxiety disorder in her maternal grandmother and mother; Bradycardia in  her father; Eczema in her sister; Headache in her brother; Heart attack in her paternal grandmother; Heart attack (age of  onset: 33) in her father; Migraines in her maternal grandmother and mother.  Social History Social History   Socioeconomic History   Marital status: Significant Other    Spouse name: Not on file   Number of children: 0   Years of education: Not on file   Highest education level: Some college, no degree  Occupational History   Not on file  Tobacco Use   Smoking status: Never    Passive exposure: Past   Smokeless tobacco: Never  Vaping Use   Vaping status: Never Used  Substance and Sexual Activity   Alcohol use: No   Drug use: No   Sexual activity: Yes    Partners: Female    Birth control/protection: Surgical  Other Topics Concern   Not on file  Social History Narrative   2019 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 She enjoys music, science and hanging with friends and family   Caffeine- average 2 a day, used to drink more caffeine   Social Drivers of Corporate investment banker Strain: Low Risk  (12/25/2021)   Overall Financial Resource Strain (CARDIA)    Difficulty of Paying Living Expenses: Not hard at all  Food Insecurity: Food Insecurity Present (02/10/2022)   Hunger Vital Sign    Worried About Running Out of Food in the Last Year: Sometimes true    Ran Out of Food in the Last Year: Sometimes true  Transportation Needs: No Transportation Needs (12/25/2021)   PRAPARE - Administrator, Civil Service (Medical): No    Lack of Transportation (Non-Medical): No  Physical Activity: Sufficiently Active (12/31/2021)   Exercise Vital Sign    Days of Exercise per Week: 6 days    Minutes of Exercise per Session: 60 min  Recent Concern: Physical Activity - Inactive (12/25/2021)   Exercise Vital Sign    Days of Exercise per Week: 0 days    Minutes of Exercise per Session: 0 min  Stress:  Stress Concern Present (12/31/2021)   [employer] of Occupational Health - Occupational Stress Questionnaire    Feeling of Stress : Very much  Social Connections: Moderately Isolated (12/31/2021)   Social Connection and Isolation Panel [NHANES]    Frequency of Communication with Friends and Family: More than three times a week    Frequency of Social Gatherings with Friends and Family: Once a week    Attends Religious Services: Never    Database administrator or Organizations: No    Attends Banker Meetings: Never    Marital Status: Living with partner  Intimate Partner Violence: Not At Risk (12/31/2021)   Humiliation, Afraid, Rape, and Kick questionnaire    Fear of Current or Ex-Partner: No    Emotionally Abused: No    Physically Abused: No    Sexually Abused: No    Lab Results  Component Value Date   HGBA1C 7.1 (H) 05/30/2023   HGBA1C 8.1 (H) 02/21/2023   HGBA1C 6.0 (H) 02/02/2022   Lab Results  Component Value Date   CHOL 201 (H) 06/09/2023   Lab Results  Component Value Date   HDL 43 (L) 06/09/2023   Lab Results  Component Value Date   LDLCALC 123 (H) 06/09/2023   Lab Results  Component Value Date   TRIG 229 (H) 06/09/2023   Lab Results  Component Value Date   CHOLHDL 4.7 06/09/2023   Lab Results  Component Value Date  CREATININE 0.61 06/09/2023   No results found for: "GFR" Lab Results  Component Value Date   MICROALBUR <0.2 06/09/2023      Component Value Date/Time   NA 136 06/09/2023 1351   NA 134 05/30/2023 1550   K 3.9 06/09/2023 1351   CL 101 06/09/2023 1351   CO2 26 06/09/2023 1351   GLUCOSE 219 (H) 06/09/2023 1351   BUN 11 06/09/2023 1351   BUN 11 05/30/2023 1550   CREATININE 0.61 06/09/2023 1351   CALCIUM 9.0 06/09/2023 1351   PROT 7.0 06/09/2023 1351   PROT 7.2 05/30/2023 1550   ALBUMIN 4.2 05/30/2023 1550   AST 15 06/09/2023 1351   ALT 22 06/09/2023 1351   ALKPHOS 106 05/30/2023 1550   BILITOT 0.4 06/09/2023 1351    BILITOT 0.2 05/30/2023 1550   GFRNONAA >60 02/22/2023 1525      Latest Ref Rng & Units 06/09/2023    1:51 PM 05/30/2023    3:50 PM 02/22/2023    3:25 PM  BMP  Glucose 65 - 99 mg/dL 213  086  578   BUN 7 - 25 mg/dL 11  11  11    Creatinine 0.50 - 0.96 mg/dL 4.69  6.29  5.28   BUN/Creat Ratio 6 - 22 (calc) SEE NOTE:  18    Sodium 135 - 146 mmol/L 136  134  136   Potassium 3.5 - 5.3 mmol/L 3.9  4.3  3.5   Chloride 98 - 110 mmol/L 101  98  100   CO2 20 - 32 mmol/L 26  23  25    Calcium 8.6 - 10.2 mg/dL 9.0  9.4  9.3        Component Value Date/Time   WBC 12.9 (H) 05/30/2023 1550   WBC 13.5 (H) 02/22/2023 1525   RBC 4.77 05/30/2023 1550   RBC 4.79 02/22/2023 1525   HGB 14.3 05/30/2023 1550   HCT 43.7 05/30/2023 1550   PLT 299 05/30/2023 1550   MCV 92 05/30/2023 1550   MCH 30.0 05/30/2023 1550   MCH 30.1 02/22/2023 1525   MCHC 32.7 05/30/2023 1550   MCHC 35.0 02/22/2023 1525   RDW 11.8 05/30/2023 1550   LYMPHSABS 2.8 05/30/2023 1550   MONOABS 0.7 02/22/2023 1525   EOSABS 0.3 05/30/2023 1550   BASOSABS 0.0 05/30/2023 1550     Parts of this note may have been dictated using voice recognition software. There may be variances in spelling and vocabulary which are unintentional. Not all errors are proofread. Please notify the Thereasa Parkin if any discrepancies are noted or if the meaning of any statement is not clear.

## 2023-06-15 NOTE — Patient Instructions (Signed)
We will start you on an extended release version of metformin. Start with one 500 mg capsule taken every evening with dinner. Stay on this for 3-5 days, then increase to 1 tablet with breakfast and one with dinner. If you do not have any upset stomach, gradually increase to the goal dose of 2 capsules with breakfast and 2 capsules with dinner. If you do have upset stomach, decrease it back to the previous dose that you tolerated.  Week 1: one tablet after dinner Week 2: one tablet after breakfast and one after dinner  Week 3: one tablet after breakfast and two after dinner  Week 4 and onwards: two tablets after breakfast and two after dinner

## 2023-06-17 ENCOUNTER — Encounter: Payer: Self-pay | Admitting: Nurse Practitioner

## 2023-06-17 MED ORDER — OZEMPIC (0.25 OR 0.5 MG/DOSE) 2 MG/3ML ~~LOC~~ SOPN: PEN_INJECTOR | SUBCUTANEOUS | 0 refills | Status: DC

## 2023-06-17 MED ORDER — OZEMPIC (1 MG/DOSE) 2 MG/1.5ML ~~LOC~~ SOPN: 1.0000 mg | PEN_INJECTOR | SUBCUTANEOUS | 0 refills | Status: DC

## 2023-06-17 MED ORDER — OZEMPIC (2 MG/DOSE) 8 MG/3ML ~~LOC~~ SOPN: 2.0000 mg | PEN_INJECTOR | SUBCUTANEOUS | 1 refills | Status: DC

## 2023-06-20 ENCOUNTER — Other Ambulatory Visit (HOSPITAL_COMMUNITY): Payer: Self-pay

## 2023-06-20 ENCOUNTER — Telehealth: Payer: Self-pay

## 2023-06-20 ENCOUNTER — Telehealth: Payer: Self-pay | Admitting: Nurse Practitioner

## 2023-06-20 NOTE — Telephone Encounter (Signed)
Pt called regarding Prior auth on her ozempic please advise

## 2023-06-20 NOTE — Telephone Encounter (Signed)
Pharmacy Patient Advocate Encounter    Received notification from Physician's Office that prior authorization for Ozempic is required/requested.    Insurance verification completed.   The patient is insured through [facility] .    Per test claim: PA required; PA submitted to above mentioned insurance via CoverMyMeds Key/confirmation #/EOC  (Key: [id number]) Status is pending

## 2023-06-27 ENCOUNTER — Telehealth: Payer: Self-pay

## 2023-06-27 ENCOUNTER — Other Ambulatory Visit (HOSPITAL_COMMUNITY): Payer: Self-pay

## 2023-06-27 NOTE — Telephone Encounter (Signed)
I have followed up with this Prior Authorization and had to initiate another Verbally and requested that its expedited. The turn around time for this determination is now 24hrs.

## 2023-06-27 NOTE — Telephone Encounter (Signed)
No determination has been made via cmm for 7 days Now. I have ran a test claim and it shows a paid claim, However the Representative at [facility] has informed me ''that the formulary could have updated'' but the t/c has a paid claim of 4.00 but still isnt processing at the pharmacy and shows a Prior Authorizatioin is needed. I have submitted another Prior Authorization verbally at [phone] with a reference # of [id number] and was informed a decision would be made in 24hrs since documentation has been faxed as of now.

## 2023-06-27 NOTE — Telephone Encounter (Signed)
Please follow newly created Encounter    No determination has been made via cmm for 7 days Now. I have ran a test claim and it shows a paid claim, However the Representative at [facility] has informed me ''that the formulary could have updated'' but the t/c has a paid claim of 4.00 but still isnt processing at the pharmacy and shows a Prior Authorizatioin is needed. I have submitted another Prior Authorization verbally at [phone] with a reference # of [id number] and was informed a decision would be made in 24hrs since documentation has been faxed as of now.

## 2023-06-28 NOTE — Telephone Encounter (Signed)
 Pharmacy Patient Advocate Encounter   Received notification from [facility] that Prior Authorization for Ozempic  has been APPROVED from 2.3.25 to 2.3.26. Ran test claim, Copay is $0.00. This test claim was processed through [facility]- copay amounts may vary at other pharmacies due to pharmacy/plan contracts, or as the patient moves through the different stages of their insurance plan.   I have spoke to the pts pharmacy ([facility] on [address]) where it was transferred too, and was informed the cost is 0.00

## 2023-07-13 ENCOUNTER — Telehealth: Payer: Self-pay

## 2023-07-13 ENCOUNTER — Other Ambulatory Visit (HOSPITAL_COMMUNITY): Payer: Self-pay

## 2023-07-13 ENCOUNTER — Other Ambulatory Visit: Payer: Self-pay | Admitting: "Endocrinology

## 2023-07-13 NOTE — Telephone Encounter (Signed)
 Pharmacy Patient Advocate Encounter   Received notification from Pt Calls Messages that prior authorization for Dexcom G7 sensor is required/requested.   Insurance verification completed.   The patient is insured through [facility] .   Per test claim: PA required; PA submitted to above mentioned insurance via CoverMyMeds Key/confirmation #/EOC [id number] Status is pending

## 2023-07-13 NOTE — Telephone Encounter (Signed)
 Dexcom needs PA

## 2023-07-15 NOTE — Telephone Encounter (Signed)
 Pharmacy Patient Advocate Encounter  Received notification from [facility] that Prior Authorization for Dexcom G7 sensor has been DENIED.  See denial reason below. No denial letter attached in CMM. Will attach denial letter to Media tab once received.  Must use insulin   PA #/Case ID/Reference #: [id number]

## 2023-07-19 ENCOUNTER — Other Ambulatory Visit: Payer: Self-pay

## 2023-07-19 ENCOUNTER — Encounter: Payer: Self-pay | Admitting: Internal Medicine

## 2023-07-19 ENCOUNTER — Ambulatory Visit: Payer: Medicaid Other | Admitting: Internal Medicine

## 2023-07-19 VITALS — BP 118/80 | HR 106 | Temp 98.5°F | Wt 185.1 lb

## 2023-07-19 DIAGNOSIS — L508 Other urticaria: Secondary | ICD-10-CM | POA: Diagnosis not present

## 2023-07-19 DIAGNOSIS — J3089 Other allergic rhinitis: Secondary | ICD-10-CM | POA: Diagnosis not present

## 2023-07-19 DIAGNOSIS — J302 Other seasonal allergic rhinitis: Secondary | ICD-10-CM

## 2023-07-19 DIAGNOSIS — J4520 Mild intermittent asthma, uncomplicated: Secondary | ICD-10-CM | POA: Diagnosis not present

## 2023-07-19 MED ORDER — LEVOCETIRIZINE DIHYDROCHLORIDE 5 MG PO TABS: 5.0000 mg | ORAL_TABLET | Freq: Two times a day (BID) | ORAL | 5 refills | Status: DC | PRN

## 2023-07-19 MED ORDER — FLUTICASONE PROPIONATE 50 MCG/ACT NA SUSP: 2.0000 | Freq: Every day | NASAL | 5 refills | Status: DC

## 2023-07-19 MED ORDER — AZELASTINE HCL 0.1 % NA SOLN: 2.0000 | Freq: Two times a day (BID) | NASAL | 5 refills | Status: DC | PRN

## 2023-07-19 MED ORDER — FAMOTIDINE 20 MG PO TABS: 20.0000 mg | ORAL_TABLET | Freq: Two times a day (BID) | ORAL | 5 refills | Status: AC | PRN

## 2023-07-19 MED ORDER — ALBUTEROL SULFATE HFA 108 (90 BASE) MCG/ACT IN AERS: 2.0000 | INHALATION_SPRAY | Freq: Four times a day (QID) | RESPIRATORY_TRACT | 1 refills | Status: AC | PRN

## 2023-07-19 MED ORDER — ACCU-CHEK GUIDE TEST VI STRP: ORAL_STRIP | 12 refills | Status: AC

## 2023-07-19 NOTE — Patient Instructions (Addendum)
 Urticaria (Hives): - At this time etiology of hives and swelling is unknown. Hives can be caused by a variety of different triggers including illness/infection, exercise, pressure, vibrations, extremes of temperature to name a few however majority of the time there is no identifiable trigger.  - Use Xyzal 5mg  daily.  -If no improvement in 2-3 days, increase to Xyzal 5mg  twice daily.  -If no improvement in 2-3 days, add Pepcid 20mg  twice daily and continue Xyzal 5mg  twice daily.  Allergic Rhinitis:  - SPT 03/2023: grasses, weeds, dust mites, tobacco leaf  - Use nasal saline rinses before nose sprays such as with Neilmed Sinus Rinse.  Use distilled water.   - Use Flonase 2 sprays each nostril daily. Aim upward and outward. - Use Azelastine 2 sprays each nostril twice daily as needed for congestion, drainage, sneezing, runny nose.  Aim upward and outward. - Use Xyzal 5mg  daily.  - Consider allergy shots as long term control of your symptoms by teaching your immune system to be more tolerant of your allergy triggers.  Mild Intermittent Asthma: Post Infectious Cough - Normal spirometry today.  Cough is likely post viral and will resolve with time.   - Rescue inhaler: Albuterol 1-2 puffs every 4-6 hours as needed for respiratory symptoms of shortness of breath, or wheezing Asthma control goals:  Full participation in all desired activities (may need albuterol before activity) Albuterol use two times or less a week on average (not counting use with activity) Cough interfering with sleep two times or less a month Oral steroids no more than once a year No hospitalizations

## 2023-07-19 NOTE — Progress Notes (Signed)
 FOLLOW UP Date of Service/Encounter:  07/19/23   Subjective:  Alyssa Bennett (DOB: Apr 21, 2001) is a 23 y.o. female who [facility] for follow up for asthma, allergic rhinitis and urticaria.   History obtained from: chart review and patient. Last visit was on 05/17/2023 with me Hives- doing well on Xyzal Rhinitis-Flonase/Xyzal, uncontrolled, discussed AIT/Azelastine. Asthma- not sure if random episodes of gasping/dyspnea were asthma; try albuterol inhaler for symptoms; obtain spiro at next visit.  Reports being sick with flu like symptoms about 2 weeks ago and still has some congestion and cough. Not bringing up any discolored mucous, no high fevers. Used albuterol a few times with minimal relief. No SOB/slight wheezing sounds.    Reports breaking out in hives about twice a week.  Taking Xyzal daily, sometimes increase to BID and that does help control it.  Would be interested in blood testing to find any causes. Not on Pepcid.    Allergies are up and down.  More congested recently after the illness. Using Flonase daily and Xyzal daily, sometimes Azelastine.  Does report forgetting to take her nose sprays.    Past Medical History: Past Medical History:  Diagnosis Date   Abnormal urinalysis 05/13/2023   Acute pain of left knee 02/02/2022   Acute right ankle pain 02/02/2022   ADHD (attention deficit hyperactivity disorder)    Allergies    Angio-edema    Asthma    Atypical chest pain 12/25/2021   Atypical chest pain 12/25/2021   Bulimia nervosa 12/31/2021   Dermatitis 07/09/2015   Full body hives 11/09/2021   Hives    Migraine    Ovarian cyst    Palpitations 12/04/2021   Stabbing headache 11/09/2021   Straining to void 05/13/2023   Tachycardia 07/08/2015   Urinary urgency 02/21/2023   Urticaria     Objective:  BP 118/80 (BP Location: Left Arm, Patient Position: Sitting, Cuff Size: Normal)   Pulse (!) 106   Temp 98.5 F (36.9  C) (Temporal)   Wt 185 lb 1.6 oz (84 kg)   SpO2 96%   BMI 34.97 kg/m  Body mass index is 34.97 kg/m. Physical Exam: GEN: alert, well developed HEENT: clear conjunctiva, nose with mild inferior turbinate hypertrophy, pink nasal mucosa, + clear rhinorrhea, + cobblestoning HEART: regular rate and rhythm, no murmur LUNGS: clear to auscultation bilaterally, no coughing, unlabored respiration SKIN: no rashes or lesions  Spirometry:  Tracings reviewed. Her effort: Good reproducible efforts. FVC: 3.33L, 97% predicted  FEV1: 2.73L, 92% predicted FEV1/FVC ratio: 82% Interpretation: Spirometry consistent with normal pattern.  Please see scanned spirometry results for details.  Assessment:   1. Chronic urticaria   2. Seasonal and perennial allergic rhinitis   3. Mild intermittent asthma without complication     Plan/Recommendations:  Chronic Idiopathic Urticaria (Hives): - Few outbreaks a week, discussed can updose Xyzal.  Will obtain blood test to rule out alpha gal, mast cell dx, inflammatory dx, CU index.  - At this time etiology of hives and swelling is unknown. Hives can be caused by a variety of different triggers including illness/infection, exercise, pressure, vibrations, extremes of temperature to name a few however majority of the time there is no identifiable trigger.  - Use Xyzal 5mg  daily.  -If no improvement in 2-3 days, increase to Xyzal 5mg  twice daily.  -If no improvement in 2-3 days, add Pepcid 20mg  twice daily and continue Xyzal 5mg  twice daily.  Allergic Rhinitis:  - Recently  uncontrolled rhinitis due to viral URI.  - SPT 03/2023: grasses, weeds, dust mites, tobacco leaf  - Use nasal saline rinses before nose sprays such as with Neilmed Sinus Rinse.  Use distilled water.   - Use Flonase 2 sprays each nostril daily. Aim upward and outward. - Use Azelastine 2 sprays each nostril twice daily as needed for congestion, drainage, sneezing, runny nose.  Aim upward and  outward. - Use Xyzal 5mg  daily.  - Consider allergy shots as long term control of your symptoms by teaching your immune system to be more tolerant of your allergy triggers.  Mild Intermittent Asthma: Post Infectious Cough - Normal spirometry today.  Cough is likely post viral and will resolve with time.  MDI technique discussed.  - Rescue inhaler: Albuterol 1-2 puffs every 4-6 hours as needed for respiratory symptoms of shortness of breath, or wheezing Asthma control goals:  Full participation in all desired activities (may need albuterol before activity) Albuterol use two times or less a week on average (not counting use with activity) Cough interfering with sleep two times or less a month Oral steroids no more than once a year No hospitalizations    Return in about 6 months (around 01/16/2024).  Alesia Morin, MD [facility]

## 2023-07-20 ENCOUNTER — Encounter: Payer: Self-pay | Admitting: Radiology

## 2023-07-20 ENCOUNTER — Ambulatory Visit: Payer: Medicaid Other | Admitting: Radiology

## 2023-07-20 VITALS — BP 104/72 | HR 108 | Ht 61.0 in | Wt 185.8 lb

## 2023-07-20 DIAGNOSIS — N83201 Unspecified ovarian cyst, right side: Secondary | ICD-10-CM | POA: Diagnosis not present

## 2023-07-20 DIAGNOSIS — N946 Dysmenorrhea, unspecified: Secondary | ICD-10-CM | POA: Diagnosis not present

## 2023-07-20 NOTE — Progress Notes (Signed)
   Alyssa Bennett Aug 23, 2000 [id number]   History:  23 y.o. G0 presents as a new patient. Referred from PCP for dysmenorrhea. Complains of dysmenorrhea since menarche (increased with age). LMP: 2 months ago, currently taking OCPs continuously x 2 months. History of ovarian cysts, most recent on the right. Was seen in ED for it 02/2023.  Gynecologic History No LMP recorded.   Contraception/Family planning: OCP (estrogen/progesterone) Sexually active: yes Last Pap: 7/23. Results were: normal   Obstetric History OB History  Gravida Para Term Preterm AB Living  0 0 0 0 0 0  SAB IAB Ectopic Multiple Live Births  0 0 0 0 0       05/11/2022    1:17 PM 02/10/2022    2:27 PM 02/02/2022    1:20 PM  Depression screen PHQ 2/9  Decreased Interest 1 1 1   Down, Depressed, Hopeless 2 1 1   PHQ - 2 Score 3 2 2   Altered sleeping 2  1  Tired, decreased energy 2  1  Change in appetite 1  2  Feeling bad or failure about yourself  2  2  Trouble concentrating 1  2  Moving slowly or fidgety/restless 0  0  Suicidal thoughts 0  0  PHQ-9 Score 11  10  Difficult doing work/chores Somewhat difficult  Not difficult at all     The following portions of the patient's history were reviewed and updated as appropriate: allergies, current medications, past family history, past medical history, past social history, past surgical history, and problem list.  Review of Systems  All other systems reviewed and are negative.   Past medical history, past surgical history, family history and social history were all reviewed and documented in the EPIC chart.  Exam:  Vitals:   07/20/23 1128  BP: 104/72  Pulse: (!) 108  SpO2: 99%  Weight: 185 lb 12.8 oz (84.3 kg)  Height: 5\' 1"  (1.549 m)   Body mass index is 35.11 kg/m.  Physical Exam Vitals and nursing note reviewed.  Constitutional:      Appearance: Normal appearance.  Cardiovascular:     Rate and Rhythm: Normal rate and regular rhythm.  Pulmonary:      Effort: Pulmonary effort is normal.  Abdominal:     General: Abdomen is flat. Bowel sounds are normal.     Palpations: Abdomen is soft.  Genitourinary:    General: Normal vulva.     Vagina: Normal.     Cervix: Normal.     Uterus: Normal.      Adnexa: Right adnexa normal and left adnexa normal.  Neurological:     Mental Status: She is alert.  Psychiatric:        Mood and Affect: Mood normal.        Thought Content: Thought content normal.        Judgment: Judgment normal.      Raynelle Fanning, CMA present for exam  Assessment/Plan:   1. Dysmenorrhea (Primary) - US Transvaginal Non-OB; Future  2. Right ovarian cyst - US Transvaginal Non-OB; Future   Will follow up after u/s to discuss management. Continue OCPs continuously for now.  Arlie Solomons B WHNP-BC 12:40 PM 07/20/2023

## 2023-07-21 ENCOUNTER — Ambulatory Visit: Payer: Medicaid Other | Admitting: "Endocrinology

## 2023-07-24 ENCOUNTER — Encounter: Payer: Self-pay | Admitting: Nurse Practitioner

## 2023-07-26 LAB — TRYPTASE: Tryptase: 3.2 ug/L (ref 2.2–13.2)

## 2023-07-26 LAB — ALPHA-GAL PANEL
Allergen Lamb IgE: <0.1 kU/L
Beef IgE: <0.1 kU/L
IgE (Immunoglobulin E), Serum: 245 [IU]/mL (ref 6–495)
O215-IgE Alpha-Gal: <0.1 kU/L
Pork IgE: <0.1 kU/L

## 2023-07-26 LAB — ANA W/REFLEX: Anti Nuclear Antibody (ANA): NEGATIVE

## 2023-07-26 LAB — CHRONIC URTICARIA: cu index: 12.3 — ABNORMAL HIGH (ref <10)

## 2023-08-01 NOTE — Telephone Encounter (Signed)
 Pt called regarding her paperwork states it needs to be sent in by Friday please advise

## 2023-08-01 NOTE — Telephone Encounter (Signed)
 Pt following up on paperwork for ESA.  Pt states she is getting "down to the wire" and needs asap.

## 2023-08-11 ENCOUNTER — Ambulatory Visit: Payer: Medicaid Other | Admitting: Radiology

## 2023-08-11 ENCOUNTER — Ambulatory Visit (INDEPENDENT_AMBULATORY_CARE_PROVIDER_SITE_OTHER): Payer: Medicaid Other

## 2023-08-11 ENCOUNTER — Encounter: Payer: Self-pay | Admitting: Radiology

## 2023-08-11 ENCOUNTER — Telehealth: Payer: Self-pay

## 2023-08-11 VITALS — BP 116/72

## 2023-08-11 DIAGNOSIS — N946 Dysmenorrhea, unspecified: Secondary | ICD-10-CM | POA: Diagnosis not present

## 2023-08-11 MED ORDER — NORELGESTROMIN-ETH ESTRADIOL 150-35 MCG/24HR TD PTWK: 1.0000 | MEDICATED_PATCH | TRANSDERMAL | 2 refills | Status: DC

## 2023-08-11 NOTE — Telephone Encounter (Signed)
 Dr Roosevelt Locks would like to do an appeal for the Dexcom G7.

## 2023-08-11 NOTE — Progress Notes (Signed)
   Alyssa Bennett 11-26-2000 [id number]   History:  23 y.o. G0 presents for follow up ultrasound. Referred from PCP for dysmenorrhea. Complains of dysmenorrhea since menarche (increased with age). LMP: 2 months ago, currently taking OCPs continuously x 2 months. History of ovarian cysts, most recent on the right. Was seen in ED for it 02/2023. Ran out of OCPs so she stopped.  Gynecologic History No LMP recorded.   Contraception/Family planning: OCP (estrogen/progesterone) Sexually active: yes Last Pap: 7/23. Results were: normal   Obstetric History OB History  Gravida Para Term Preterm AB Living  0 0 0 0 0 0  SAB IAB Ectopic Multiple Live Births  0 0 0 0 0       05/11/2022    1:17 PM 02/10/2022    2:27 PM 02/02/2022    1:20 PM  Depression screen PHQ 2/9  Decreased Interest 1 1 1   Down, Depressed, Hopeless 2 1 1   PHQ - 2 Score 3 2 2   Altered sleeping 2  1  Tired, decreased energy 2  1  Change in appetite 1  2  Feeling bad or failure about yourself  2  2  Trouble concentrating 1  2  Moving slowly or fidgety/restless 0  0  Suicidal thoughts 0  0  PHQ-9 Score 11  10  Difficult doing work/chores Somewhat difficult  Not difficult at all     The following portions of the patient's history were reviewed and updated as appropriate: allergies, current medications, past family history, past medical history, past social history, past surgical history, and problem list.  Review of Systems  All other systems reviewed and are negative.   Past medical history, past surgical history, family history and social history were all reviewed and documented in the EPIC chart.  Exam:  Vitals:   08/11/23 1436  BP: 116/72   There is no height or weight on file to calculate BMI.  Narrative & Impression Indication: Right ovarian cyst, dysmenorrhea   Vaginal ultrasound   Anteverted size and shape 5.42 x 3.73 x 2.69 cm No myometrial masses     Thin symmetrical endometrium 5.72 mm No  masses or thickening seen   Both ovaries normal size with normal follicle pattern and perfusion   No adnexal masses   No free fluid   Impression:normal pelvic u/s resolved right ovarian cyst       Exam Ended: 08/11/23 14:23 Last Resulted: 08/11/23 15:01    Assessment/Plan:   1. Dysmenorrhea (Primary) - norelgestromin-ethinyl estradiol [facility]) 150-35 MCG/24HR transdermal patch; Place 1 patch onto the skin once a week.  Dispense: 9 patch; Refill: 2   Follow up 3 months  Alyssa Bennett B WHNP-BC 3:02 PM 08/11/2023

## 2023-08-12 ENCOUNTER — Ambulatory Visit: Payer: Medicaid Other | Admitting: Obstetrics

## 2023-08-12 ENCOUNTER — Encounter: Payer: Self-pay | Admitting: Obstetrics

## 2023-08-12 VITALS — BP 92/55 | HR 105

## 2023-08-12 DIAGNOSIS — R102 Pelvic and perineal pain: Secondary | ICD-10-CM | POA: Diagnosis not present

## 2023-08-12 DIAGNOSIS — R351 Nocturia: Secondary | ICD-10-CM

## 2023-08-12 DIAGNOSIS — N39492 Postural (urinary) incontinence: Secondary | ICD-10-CM

## 2023-08-12 DIAGNOSIS — N94819 Vulvodynia, unspecified: Secondary | ICD-10-CM | POA: Diagnosis not present

## 2023-08-12 DIAGNOSIS — K582 Mixed irritable bowel syndrome: Secondary | ICD-10-CM

## 2023-08-12 DIAGNOSIS — F411 Generalized anxiety disorder: Secondary | ICD-10-CM

## 2023-08-12 MED ORDER — LIDOCAINE 5 % EX OINT: 1.0000 | TOPICAL_OINTMENT | Freq: Three times a day (TID) | CUTANEOUS | 0 refills | Status: AC | PRN

## 2023-08-12 NOTE — Assessment & Plan Note (Signed)
-   avoid fluid intake after 6pm - elevated feet during the day or use compression socks to reduce lower extremity swelling - reports minimal snoring, consider workup for sleep apnea if refractory symptoms

## 2023-08-12 NOTE — Assessment & Plan Note (Signed)
-   discussed association of pelvic pain and pelvic floor dysfunction with mood disorder - established with psych due to history of trauma and continue meditation, pending to establish with therapy - encouraged stress management and reviewed emotional triggers with worsened pelvic floor dysfunction and pain - reviewed stress management strategies with pending triggers today

## 2023-08-12 NOTE — Patient Instructions (Addendum)
 Continue to reduce night time fluid intake.   Continue to follow-up with therapy and focus on your wellbeing with stress management.   Resume vaginal dilator use with topical lidocaine.   Please call [phone] to schedule the earliest appointment for pelvic floor PT.

## 2023-08-12 NOTE — Progress Notes (Signed)
 [facility] Return Visit  SUBJECTIVE  History of Present Illness: ELETHA CULBERTSON is a 23 y.o. female seen in follow-up for postural urinary incontinence, pelvic pain, nocturia, straining to void, vulvodynia IBS. Plan at last visit was Azo PRN, pelvic floor PT referral, topical lidocaine.   Doing well. However difficulty with scheduling a PT appt with to limited availability and recent move which makes it harder to resume dilator use. Pending gender reveal for sister with exposure to mother who triggers discomfort Started with psychiatry and pending therapy with medication changes with improved mental wellbeing. Prior dry needling for musculoskeletal pain with relief in her neck.  Prior pyridium pad test with minimal orange droplets in the past. Attempting to reduce night time fluid intake.  Reports relief with topical lidocaine.  Past Medical History: Patient  has a past medical history of Abnormal urinalysis (05/13/2023), Acute pain of left knee (02/02/2022), Acute right ankle pain (02/02/2022), ADHD (attention deficit hyperactivity disorder), Allergies, Angio-edema, Asthma, Atypical chest pain (12/25/2021), Atypical chest pain (12/25/2021), Bulimia nervosa (12/31/2021), Dermatitis (07/09/2015), Diabetes mellitus without complication (HCC), Full body hives (11/09/2021), Hives, Migraine, Ovarian cyst, Palpitations (12/04/2021), Stabbing headache (11/09/2021), Straining to void (05/13/2023), Tachycardia (07/08/2015), Urinary urgency (02/21/2023), and Urticaria.   Past Surgical History: She  has a past surgical history that includes Tonsillectomy.   Medications: She has a current medication list which includes the following prescription(s): accu-chek softclix lancets, albuterol, azelastine, blood glucose monitoring suppl, clotrimazole, dexcom g7 sensor, esomeprazole, famotidine, fluticasone, accu-chek guide test, accu-chek guide, blood glucose test strips, levocetirizine, metformin,  norelgestromin-ethinyl estradiol, ondansetron, rizatriptan, ozempic (1 mg/dose), [START ON 09/09/2023] ozempic (2 mg/dose), ozempic (0.25 or 0.5 mg/dose), trazodone, vitamin d (ergocalciferol), vraylar, and lidocaine.   Allergies: Patient is allergic to zithromax [azithromycin].   Social History: Patient  reports that she has never smoked. She has been exposed to tobacco smoke. She has never used smokeless tobacco. She reports current alcohol use. She reports that she does not use drugs.     OBJECTIVE     Physical Exam: Vitals:   08/12/23 1324  BP: (!) 92/55  Pulse: (!) 105   Gen: No apparent distress, A&O x 3.  Detailed Urogynecologic Evaluation:  Deferred. Prior exam showed:      No data to display             ASSESSMENT AND PLAN    Ms. Burlison is a 23 y.o. with:  1. Vulvodynia   2. Pelvic pain   3. Postural urinary incontinence   4. Nocturia   5. Irritable bowel syndrome with both constipation and diarrhea   6. Generalized anxiety disorder     Vulvodynia Assessment & Plan: - previously provided handout regarding vulvodynia, reviewed comfort measures and treatment options.  - Rx topical lidocaine 1g PRN pain up to 3x/day as needed for pain. Refill sent per request - consider Rx Amitriptyline 2.5%/ gabapentin 2.5%/ baclofen 2.5% in vaginal cream if refractory symptoms. - lubrication use during intercourse - referral sent for pelvic floor PT appointment with number provided for pt to schedule appointment - continue to resume vaginal dilator use   Orders: -     AMB referral to rehabilitation  Pelvic pain -     Lidocaine; Apply 1 Application topically 3 (three) times daily as needed. Peasize or 0.5g  Dispense: 35.44 g; Refill: 0 -     AMB referral to rehabilitation  Postural urinary incontinence Assessment & Plan: - prior bladder scan WNL at 73mL - patient unclear if  it is urinary leakage vs. Vaginal discharge at times, Rx pyridium and reviewed pyridium pad  test with some orange droplets in the past per pt. - encouraged timed voids and reduction of fluid intake. Explained that optimization of glycemic control can reduce thirst and also improve urinary leakage symptoms - We discussed the symptoms of overactive bladder (OAB), which include urinary urgency, urinary frequency, nocturia, with or without urge incontinence.  While we do not know the exact etiology of OAB, several treatment options exist. We discussed management including behavioral therapy (decreasing bladder irritants, urge suppression strategies, timed voids, bladder retraining), physical therapy, medication; for refractory cases posterior tibial nerve stimulation, sacral neuromodulation, and intravesical botulinum toxin injection.  For Beta-3 agonist medication, we discussed the potential side effect of elevated blood pressure which is more likely to occur in individuals with uncontrolled hypertension. Consider if refractory after pelvic floor PT - referral to pelvic floor PT  Orders: -     AMB referral to rehabilitation  Nocturia Assessment & Plan: - avoid fluid intake after 6pm - elevated feet during the day or use compression socks to reduce lower extremity swelling - reports minimal snoring, consider workup for sleep apnea if refractory symptoms   Irritable bowel syndrome with both constipation and diarrhea Assessment & Plan: - cycles between constipation and diarrhea on GLP-1 - For constipation, we reviewed the importance of a better bowel regimen.  We also discussed the importance of avoiding chronic straining, as it can exacerbate her pelvic floor symptoms; we discussed treating constipation and straining prior to surgery, as postoperative straining can lead to damage to the repair and recurrence of symptoms. We discussed initiating therapy with increasing fluid intake, fiber supplementation, stool softeners, and laxatives such as miralax.  - encouraged titration of fiber  supplementation - referral for pelvic floor PT placed - consider IBGuard with samples provided  - discussed association with pelvic floor disorders    Generalized anxiety disorder Assessment & Plan: - discussed association of pelvic pain and pelvic floor dysfunction with mood disorder - established with psych due to history of trauma and continue meditation, pending to establish with therapy - encouraged stress management and reviewed emotional triggers with worsened pelvic floor dysfunction and pain - reviewed stress management strategies with pending triggers today    Loleta Chance, MD

## 2023-08-12 NOTE — Assessment & Plan Note (Addendum)
-   cycles between constipation and diarrhea on GLP-1 - For constipation, we reviewed the importance of a better bowel regimen.  We also discussed the importance of avoiding chronic straining, as it can exacerbate her pelvic floor symptoms; we discussed treating constipation and straining prior to surgery, as postoperative straining can lead to damage to the repair and recurrence of symptoms. We discussed initiating therapy with increasing fluid intake, fiber supplementation, stool softeners, and laxatives such as miralax.  - encouraged titration of fiber supplementation - referral for pelvic floor PT placed - consider IBGuard with samples provided  - discussed association with pelvic floor disorders

## 2023-08-12 NOTE — Assessment & Plan Note (Addendum)
-   previously provided handout regarding vulvodynia, reviewed comfort measures and treatment options.  - Rx topical lidocaine 1g PRN pain up to 3x/day as needed for pain. Refill sent per request - consider Rx Amitriptyline 2.5%/ gabapentin 2.5%/ baclofen 2.5% in vaginal cream if refractory symptoms. - lubrication use during intercourse - referral sent for pelvic floor PT appointment with number provided for pt to schedule appointment - continue to resume vaginal dilator use

## 2023-08-12 NOTE — Assessment & Plan Note (Signed)
-   prior bladder scan WNL at 73mL - patient unclear if it is urinary leakage vs. Vaginal discharge at times, Rx pyridium and reviewed pyridium pad test with some orange droplets in the past per pt. - encouraged timed voids and reduction of fluid intake. Explained that optimization of glycemic control can reduce thirst and also improve urinary leakage symptoms - We discussed the symptoms of overactive bladder (OAB), which include urinary urgency, urinary frequency, nocturia, with or without urge incontinence.  While we do not know the exact etiology of OAB, several treatment options exist. We discussed management including behavioral therapy (decreasing bladder irritants, urge suppression strategies, timed voids, bladder retraining), physical therapy, medication; for refractory cases posterior tibial nerve stimulation, sacral neuromodulation, and intravesical botulinum toxin injection.  For Beta-3 agonist medication, we discussed the potential side effect of elevated blood pressure which is more likely to occur in individuals with uncontrolled hypertension. Consider if refractory after pelvic floor PT - referral to pelvic floor PT

## 2023-08-19 NOTE — Therapy (Deleted)
 OUTPATIENT PHYSICAL THERAPY FEMALE PELVIC EVALUATION   Patient Name: Alyssa Bennett MRN: [id number] DOB:[birth year], 23 y.o., female Today's Date: 08/19/2023  END OF SESSION:   Past Medical History:  Diagnosis Date   Abnormal urinalysis 05/13/2023   Acute pain of left knee 02/02/2022   Acute right ankle pain 02/02/2022   ADHD (attention deficit hyperactivity disorder)    Allergies    Angio-edema    Asthma    Atypical chest pain 12/25/2021   Atypical chest pain 12/25/2021   Bulimia nervosa 12/31/2021   Dermatitis 07/09/2015   Diabetes mellitus without complication (HCC)    Full body hives 11/09/2021   Hives    Migraine    Ovarian cyst    Palpitations 12/04/2021   Stabbing headache 11/09/2021   Straining to void 05/13/2023   Tachycardia 07/08/2015   Urinary urgency 02/21/2023   Urticaria    Past Surgical History:  Procedure Laterality Date   TONSILLECTOMY     Patient Active Problem List   Diagnosis Date Noted   Vitamin B 12 deficiency 06/03/2023   BMI 37.0-37.9, adult 05/30/2023   Fatty liver 05/30/2023   Nocturia 05/13/2023   Pelvic pain 05/13/2023   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   Depression, recurrent (HCC) 02/21/2023   Postural urinary incontinence 02/21/2023   Generalized hypermobility of joints 02/21/2023   Psychophysiological insomnia 02/21/2023   Diabetes mellitus (HCC) 02/10/2022   PTSD (post-traumatic stress disorder) 12/31/2021   Gastroesophageal reflux disease 11/09/2021   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   Tourette's syndrome 01/26/2019   Generalized anxiety disorder 05/25/2018   Irregular periods 04/01/2016   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  Rationale for Evaluation and Treatment: Rehabilitation  ONSET DATE: ***  SUBJECTIVE:                                                                                                                                                                                           SUBJECTIVE STATEMENT: *** Fluid intake:   PAIN:  Are you having pain? {yes/no:20286} NPRS scale: ***/10 Pain location: {pelvic pain location:27098}  Pain type: {type:313116} Pain description: {PAIN DESCRIPTION:21022940}   Aggravating factors: *** Relieving factors: ***  PRECAUTIONS: {Therapy precautions:24002}  RED FLAGS: {PT Red Flags:29287}   WEIGHT  BEARING RESTRICTIONS: {Yes ***/No:24003}  FALLS:  Has patient fallen in last 6 months? {fallsyesno:27318}  OCCUPATION: ***  ACTIVITY LEVEL : ***  PLOF: {PLOF:24004}  PATIENT GOALS: ***  PERTINENT HISTORY:  Vulvodynia, IBS, ADHD, DM, Bilimia,  Sexual abuse: {Yes/No:304960894}  BOWEL MOVEMENT: Pain with bowel movement: {yes/no:20286} Type of bowel movement:{PT BM type:27100} Fully empty rectum: {No/Yes:304960894} Leakage: {Yes/No:304960894} Pads: {Yes/No:304960894} Fiber supplement/laxative {YES/NO AS:20300}  URINATION: Pain with urination: {yes/no:20286} Fully empty bladder: {Yes/No:304960894}*** Stream: {PT urination:27102} Urgency: {YES/NO AS:20300} Frequency: *** Leakage: {PT leakage:27103} Pads: {Yes/No:304960894}  INTERCOURSE:  Ability to have vaginal penetration {YES/NO:21197} Pain with intercourse: {pain with intercourse PA:27099} Dryness{YES/NO AS:20300} Climax: *** Marinoff Scale: ***/3 Laxative:  PREGNANCY: Vaginal deliveries *** Tearing {Yes***/No:304960894} Episiotomy {YES/NO AS:20300} C-section deliveries *** Currently pregnant {Yes***/No:304960894}  PROLAPSE: {PT prolapse:27101}   OBJECTIVE:  Note:  Objective measures were completed at Evaluation unless otherwise noted.  DIAGNOSTIC FINDINGS:  ***  PATIENT SURVEYS:  {rehab surveys:24030}  PFIQ-7: ***  COGNITION: Overall cognitive status: {cognition:24006}     SENSATION: Light touch: {intact/deficits:24005}  LUMBAR SPECIAL TESTS:  {lumbar special test:25242}  FUNCTIONAL TESTS:  {Functional tests:24029}  GAIT: Assistive device utilized: {Assistive devices:23999} Comments: ***  POSTURE: {posture:25561}   LUMBARAROM/PROM:  A/PROM A/PROM  eval  Flexion   Extension   Right lateral flexion   Left lateral flexion   Right rotation   Left rotation    (Blank rows = not tested)  LOWER EXTREMITY ROM:  {AROM/PROM:27142} ROM Right eval Left eval  Hip flexion    Hip extension    Hip abduction    Hip adduction    Hip internal rotation    Hip external rotation    Knee flexion    Knee extension    Ankle dorsiflexion    Ankle plantarflexion    Ankle inversion    Ankle eversion     (Blank rows = not tested)  LOWER EXTREMITY MMT:  MMT Right eval Left eval  Hip flexion    Hip extension    Hip abduction    Hip adduction    Hip internal rotation    Hip external rotation    Knee flexion    Knee extension    Ankle dorsiflexion    Ankle plantarflexion    Ankle inversion    Ankle eversion     (Blank rows = not tested) PALPATION:   General: ***  Pelvic Alignment: ***  Abdominal: ***                External Perineal Exam: ***                             Internal Pelvic Floor: ***  Patient confirms identification and approves PT to assess internal pelvic floor and treatment {yes/no:20286}  PELVIC MMT:   MMT eval  Vaginal   Internal Anal Sphincter   External Anal Sphincter   Puborectalis   Diastasis Recti   (Blank rows = not tested)        TONE: ***  PROLAPSE: ***  TODAY'S TREATMENT:  DATE: ***  EVAL ***   PATIENT EDUCATION:  Education details: *** Person educated: {Person educated:25204} Education method: {Education Method:25205} Education comprehension: {Education Comprehension:25206}  HOME EXERCISE PROGRAM: ***  ASSESSMENT:  CLINICAL IMPRESSION: Patient is a *** y.o. *** who was seen today for physical therapy evaluation and treatment for ***.   OBJECTIVE IMPAIRMENTS: {opptimpairments:25111}.   ACTIVITY LIMITATIONS: {activitylimitations:27494}  PARTICIPATION LIMITATIONS: {participationrestrictions:25113}  PERSONAL FACTORS: {Personal factors:25162} are also affecting patient's functional outcome.   REHAB POTENTIAL: {rehabpotential:25112}  CLINICAL DECISION MAKING: {clinical decision making:25114}  EVALUATION COMPLEXITY: {Evaluation complexity:25115}   GOALS: Goals reviewed with patient? {yes/no:20286}  SHORT TERM GOALS: Target date: ***  *** Baseline: Goal status: INITIAL  2.  *** Baseline:  Goal status: INITIAL  3.  *** Baseline:  Goal status: INITIAL  4.  *** Baseline:  Goal status: INITIAL  5.  *** Baseline:  Goal status: INITIAL  6.  *** Baseline:  Goal status: INITIAL  LONG TERM GOALS: Target date: ***  *** Baseline:  Goal status: INITIAL  2.  *** Baseline:  Goal status: INITIAL  3.  *** Baseline:  Goal status: INITIAL  4.  *** Baseline:  Goal status: INITIAL  5.  *** Baseline:  Goal status: INITIAL  6.  *** Baseline:  Goal status: INITIAL  PLAN:  PT FREQUENCY: {rehab frequency:25116}  PT DURATION: {rehab duration:25117}  PLANNED INTERVENTIONS: {rehab planned interventions:25118::"97110-Therapeutic exercises","97530- Therapeutic [phone]- Neuromuscular re-education","97535- Self RJJO","84166- Manual therapy"}  PLAN FOR NEXT SESSION: ***   Amandajo Gonder, PT 08/19/2023, 12:08 PM

## 2023-08-21 ENCOUNTER — Other Ambulatory Visit: Payer: Self-pay | Admitting: Nurse Practitioner

## 2023-08-21 DIAGNOSIS — F502 Bulimia nervosa, unspecified: Secondary | ICD-10-CM

## 2023-08-21 DIAGNOSIS — F5104 Psychophysiologic insomnia: Secondary | ICD-10-CM

## 2023-08-21 DIAGNOSIS — F339 Major depressive disorder, recurrent, unspecified: Secondary | ICD-10-CM

## 2023-08-22 ENCOUNTER — Ambulatory Visit: Payer: Medicaid Other | Attending: Obstetrics | Admitting: Physical Therapy

## 2023-08-22 ENCOUNTER — Telehealth: Payer: Self-pay | Admitting: Physical Therapy

## 2023-08-22 NOTE — Telephone Encounter (Signed)
 Last apt 05/30/23.

## 2023-08-22 NOTE — Telephone Encounter (Signed)
 Called patient about her missed appointment today at 14:00. Left a message.  Eulis Foster, PT @3 /31/25@ 2:38 PM

## 2023-08-29 ENCOUNTER — Ambulatory Visit: Payer: Medicaid Other | Admitting: Physical Therapy

## 2023-08-30 ENCOUNTER — Encounter: Payer: Self-pay | Admitting: Nurse Practitioner

## 2023-08-30 ENCOUNTER — Ambulatory Visit: Payer: Medicaid Other | Admitting: Nurse Practitioner

## 2023-08-30 VITALS — BP 116/74 | HR 90 | Wt 186.4 lb

## 2023-08-30 DIAGNOSIS — E538 Deficiency of other specified B group vitamins: Secondary | ICD-10-CM

## 2023-08-30 DIAGNOSIS — E1169 Type 2 diabetes mellitus with other specified complication: Secondary | ICD-10-CM | POA: Diagnosis not present

## 2023-08-30 DIAGNOSIS — F988 Other specified behavioral and emotional disorders with onset usually occurring in childhood and adolescence: Secondary | ICD-10-CM

## 2023-08-30 DIAGNOSIS — E559 Vitamin D deficiency, unspecified: Secondary | ICD-10-CM | POA: Diagnosis not present

## 2023-08-30 DIAGNOSIS — F3181 Bipolar II disorder: Secondary | ICD-10-CM

## 2023-08-30 DIAGNOSIS — K219 Gastro-esophageal reflux disease without esophagitis: Secondary | ICD-10-CM | POA: Diagnosis not present

## 2023-08-30 DIAGNOSIS — Z6837 Body mass index (BMI) 37.0-37.9, adult: Secondary | ICD-10-CM

## 2023-08-30 DIAGNOSIS — K760 Fatty (change of) liver, not elsewhere classified: Secondary | ICD-10-CM | POA: Diagnosis not present

## 2023-08-30 MED ORDER — VITAMIN D (ERGOCALCIFEROL) 1.25 MG (50000 UNIT) PO CAPS
50000.0000 [IU] | ORAL_CAPSULE | ORAL | 3 refills | Status: AC
Start: 2023-08-30 — End: ?

## 2023-08-30 MED ORDER — ONDANSETRON 4 MG PO TBDP: 4.0000 mg | ORAL_TABLET | Freq: Three times a day (TID) | ORAL | 2 refills | Status: DC | PRN

## 2023-08-30 MED ORDER — OZEMPIC (0.25 OR 0.5 MG/DOSE) 2 MG/3ML ~~LOC~~ SOPN
PEN_INJECTOR | SUBCUTANEOUS | 2 refills | Status: DC
Start: 2023-08-30 — End: 2023-11-29

## 2023-08-30 MED ORDER — ESOMEPRAZOLE MAGNESIUM 40 MG PO CPDR: DELAYED_RELEASE_CAPSULE | ORAL | 3 refills | Status: AC

## 2023-08-30 NOTE — Assessment & Plan Note (Signed)
 She is on Ozempic and Metformin for management. She completed the starter dose of 0.25 mg of Ozempic and is currently on 0.5 mg, experiencing significant nausea with both doses, exacerbated by eating and present even without food, likely due to blood sugar fluctuations. Her endocrinologist recommended increasing Metformin to three tablets daily, but she experiences severe discomfort and has reduced it to one tablet twice daily. Holding Metformin for a week is suggested to see if it alleviates nausea. Blood sugar readings are sometimes high, but she feels symptoms of hypoglycemia. The option of reducing Ozempic back to 0.25 mg if nausea persists is discussed, but staying on 0.5 mg is also suggested if tolerable. A1c and other labs will be checked to assess progress. - Hold Metformin for one week to assess impact on nausea. - Continue Ozempic 0.5 mg, but consider reducing to 0.25 mg if nausea persists. - Order A1c and other relevant labs to assess diabetes control. - Prescribe Zofran ODT for nausea management.

## 2023-08-30 NOTE — Assessment & Plan Note (Signed)
 Currently managed with psychiatry. She is on Vraylar and reports some improvement in symptoms since starting about a month ago. She is concerned about interactions with trazodone, which she takes for sleep, and is reassured that it is safe with Vraylar. She is not currently on ADHD medication due to concerns about exacerbating manic episodes. The psychiatrist may consider increasing the Vraylar dose or reintroducing ADHD medication once mood stabilization is achieved. - Continue Vraylar and monitor symptoms. - Consider increasing Vraylar dose if symptoms persist with permission from psychiatry.  - Discuss ADHD medication options with psychiatrist once mood is stabilized.

## 2023-08-30 NOTE — Patient Instructions (Addendum)
 Hold metformin for 1-2 weeks and see if your nausea gets better. If so, let me know and we can discontinue this.   Stay on the 0.5mg  if you keep having nausea with the next few doses of the Ozempic medication.  I have sent in zofran for your nausea.  Watch your intake carefully. Be sure to avoid skipping meals.

## 2023-08-30 NOTE — Assessment & Plan Note (Signed)
 Currently off of medication while psychiatry helps to better manage her conditions. Will defer further treatment to psychiatry for optimal management.

## 2023-08-30 NOTE — Assessment & Plan Note (Signed)
 Currently on Ozempic for diabetes and improved management. Discussed the importance of following a healthy diet with low carb and high protein options to reduce the risk of nausea.

## 2023-08-30 NOTE — Assessment & Plan Note (Signed)
 Repeat labs for evaluation. May need to continue with injections.

## 2023-08-30 NOTE — Assessment & Plan Note (Signed)
 She reports worsening acid reflux symptoms, including frequent burping, potentially related to carbohydrate intake. She is currently taking omeprazole at night. Adding famotidine in the morning is suggested for additional relief. - Add famotidine in the morning in addition to omeprazole at night.

## 2023-08-30 NOTE — Progress Notes (Signed)
 Shawna Clamp, DNP, AGNP-c [facility]  [address] [place], [place] [address] [phone]  ESTABLISHED PATIENT- Chronic Health and/or Follow-Up Visit  Blood pressure 116/74, pulse 90, weight 186 lb 6.4 oz (84.6 kg), last menstrual period 06/08/2023.    Alyssa Bennett is a 23 y.o. year old female presenting today for evaluation and management of chronic conditions.   History of Present Illness Alyssa Bennett "Alyssa Bennett" is a 23 year old female with type 2 diabetes who presents for a follow-up on Ozempic treatment.  She is currently on Ozempic, having completed the starter dose of 0.25 mg and now on the 0.5 mg dose. She experiences nausea with both doses, occurring almost every time she eats, regardless of the type of food. She has learned to recognize when to stop eating to prevent nausea. Additionally, she experiences nausea when she does not eat, feeling a sensation akin to hypoglycemia, although her blood sugar readings remain normal. She takes metformin, 500 mg twice daily, but experiences nausea and gagging with it. An attempt to increase the dose to three times a day resulted in feeling 'horrible', prompting a return to the twice-daily regimen. She also takes vitamin D on Sundays.  She reports strong sugar cravings and consumes sugar-free versions of sweets and carb-smart ice creams. Financial constraints impact her food choices. 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  She experiences acid reflux, particularly after consuming carbohydrate-heavy meals, and is currently taking omeprazole at night.  She has a history of migraines, attributed to a pinched nerve in her neck. The migraines are unpredictable, lasting from 30 seconds to 20 minutes. She is on Vraylar for bipolar disorder and has stopped her ADHD and nerve medications due to concerns about potential manic episodes. She feels tired and is  considering B12 shots if her levels are low.  She has tried CBD for anxiety and sleep, which helps her calm down without causing increased anxiety.  All ROS negative with exception of what is listed above.   PHYSICAL EXAM Physical Exam Vitals and nursing note reviewed.  Constitutional:      General: She is not in acute distress.    Appearance: Normal appearance. She is obese. She is not ill-appearing.  HENT:     Head: Normocephalic.  Eyes:     Conjunctiva/sclera: Conjunctivae normal.  Neck:     Vascular: No carotid bruit.  Cardiovascular:     Rate and Rhythm: Normal rate and regular rhythm.     Pulses: Normal pulses.     Heart sounds: Normal heart sounds.  Pulmonary:     Effort: Pulmonary effort is normal.     Breath sounds: Normal breath sounds.  Abdominal:     General: Bowel sounds are normal.     Palpations: Abdomen is soft.  Skin:    General: Skin is warm and dry.     Capillary Refill: Capillary refill takes less than 2 seconds.  Neurological:     Mental Status: She is alert and oriented to person, place, and time.  Psychiatric:        Mood and Affect: Mood normal.        Behavior: Behavior normal.      PLAN Problem List Items Addressed This Visit     Attention deficit disorder (ADD) without hyperactivity   Currently off of medication while psychiatry helps to better manage her conditions. Will defer further treatment to psychiatry for optimal management.  Gastroesophageal reflux disease   She reports worsening acid reflux symptoms, including frequent burping, potentially related to carbohydrate intake. She is currently taking omeprazole at night. Adding famotidine in the morning is suggested for additional relief. - Add famotidine in the morning in addition to omeprazole at night.      Relevant Medications   ondansetron (ZOFRAN-ODT) 4 MG disintegrating tablet   esomeprazole (NEXIUM) 40 MG capsule   Bipolar 2 disorder (HCC)   Currently managed with  psychiatry. She is on Vraylar and reports some improvement in symptoms since starting about a month ago. She is concerned about interactions with trazodone, which she takes for sleep, and is reassured that it is safe with Vraylar. She is not currently on ADHD medication due to concerns about exacerbating manic episodes. The psychiatrist may consider increasing the Vraylar dose or reintroducing ADHD medication once mood stabilization is achieved. - Continue Vraylar and monitor symptoms. - Consider increasing Vraylar dose if symptoms persist with permission from psychiatry.  - Discuss ADHD medication options with psychiatrist once mood is stabilized.      Fatty liver   Currently on Ozempic for diabetes and improved management. Discussed the importance of following a healthy diet with low carb and high protein options to reduce the risk of nausea.       Relevant Medications   ondansetron (ZOFRAN-ODT) 4 MG disintegrating tablet   Semaglutide,0.25 or 0.5MG /DOS, (OZEMPIC, 0.25 OR 0.5 MG/DOSE,) 2 MG/3ML SOPN   Other Relevant Orders   CBC with Differential/Platelet   Comprehensive metabolic panel with GFR   Hemoglobin A1c   Vitamin B 12 deficiency   Repeat labs for evaluation. May need to continue with injections.       Relevant Orders   Vitamin B12   Diabetes mellitus (HCC) - Primary   She is on Ozempic and Metformin for management. She completed the starter dose of 0.25 mg of Ozempic and is currently on 0.5 mg, experiencing significant nausea with both doses, exacerbated by eating and present even without food, likely due to blood sugar fluctuations. Her endocrinologist recommended increasing Metformin to three tablets daily, but she experiences severe discomfort and has reduced it to one tablet twice daily. Holding Metformin for a week is suggested to see if it alleviates nausea. Blood sugar readings are sometimes high, but she feels symptoms of hypoglycemia. The option of reducing Ozempic back to  0.25 mg if nausea persists is discussed, but staying on 0.5 mg is also suggested if tolerable. A1c and other labs will be checked to assess progress. - Hold Metformin for one week to assess impact on nausea. - Continue Ozempic 0.5 mg, but consider reducing to 0.25 mg if nausea persists. - Order A1c and other relevant labs to assess diabetes control. - Prescribe Zofran ODT for nausea management.      Relevant Medications   ondansetron (ZOFRAN-ODT) 4 MG disintegrating tablet   Semaglutide,0.25 or 0.5MG /DOS, (OZEMPIC, 0.25 OR 0.5 MG/DOSE,) 2 MG/3ML SOPN   Other Relevant Orders   CBC with Differential/Platelet   Comprehensive metabolic panel with GFR   Hemoglobin A1c   BMI 37.0-37.9, adult   Relevant Medications   Semaglutide,0.25 or 0.5MG /DOS, (OZEMPIC, 0.25 OR 0.5 MG/DOSE,) 2 MG/3ML SOPN   Other Relevant Orders   CBC with Differential/Platelet   Comprehensive metabolic panel with GFR   Hemoglobin A1c   Other Visit Diagnoses       Vitamin D deficiency       Relevant Medications   Vitamin D, Ergocalciferol, (DRISDOL) 1.25 MG (  50000 UNIT) CAPS capsule   Other Relevant Orders   VITAMIN D 25 Hydroxy (Vit-D Deficiency, Fractures)       Return in about 4 months (around 12/30/2023) for Med Management 30.  Shawna Clamp, DNP, AGNP-c

## 2023-08-31 ENCOUNTER — Other Ambulatory Visit (HOSPITAL_COMMUNITY): Payer: Self-pay

## 2023-08-31 ENCOUNTER — Encounter: Payer: Self-pay | Admitting: Nurse Practitioner

## 2023-08-31 ENCOUNTER — Telehealth: Payer: Self-pay | Admitting: Pharmacy Technician

## 2023-08-31 LAB — CBC WITH DIFFERENTIAL/PLATELET
Basophils Absolute: 0.1 10*3/uL (ref 0.0–0.2)
Basos: 1 %
EOS (ABSOLUTE): 0.2 10*3/uL (ref 0.0–0.4)
Eos: 1 %
Hematocrit: 42.2 % (ref 34.0–46.6)
Hemoglobin: 14.1 g/dL (ref 11.1–15.9)
Immature Grans (Abs): 0 10*3/uL (ref 0.0–0.1)
Immature Granulocytes: 0 %
Lymphocytes Absolute: 2.7 10*3/uL (ref 0.7–3.1)
Lymphs: 21 %
MCH: 30.3 pg (ref 26.6–33.0)
MCHC: 33.4 g/dL (ref 31.5–35.7)
MCV: 91 fL (ref 79–97)
Monocytes Absolute: 0.5 10*3/uL (ref 0.1–0.9)
Monocytes: 4 %
Neutrophils Absolute: 9.4 10*3/uL — ABNORMAL HIGH (ref 1.4–7.0)
Neutrophils: 73 %
Platelets: 394 10*3/uL (ref 150–450)
RBC: 4.65 x10E6/uL (ref 3.77–5.28)
RDW: 11.7 % (ref 11.7–15.4)
WBC: 12.9 10*3/uL — ABNORMAL HIGH (ref 3.4–10.8)

## 2023-08-31 LAB — VITAMIN B12: Vitamin B-12: 271 pg/mL (ref 232–1245)

## 2023-08-31 LAB — VITAMIN D 25 HYDROXY (VIT D DEFICIENCY, FRACTURES): Vit D, 25-Hydroxy: 50.9 ng/mL (ref 30.0–100.0)

## 2023-08-31 LAB — COMPREHENSIVE METABOLIC PANEL WITH GFR
ALT: 28 IU/L (ref 0–32)
AST: 21 IU/L (ref 0–40)
Albumin: 4.1 g/dL (ref 4.0–5.0)
Alkaline Phosphatase: 87 IU/L (ref 44–121)
BUN/Creatinine Ratio: 17 (ref 9–23)
BUN: 9 mg/dL (ref 6–20)
CO2: 22 mmol/L (ref 20–29)
Calcium: 9.5 mg/dL (ref 8.7–10.2)
Chloride: 103 mmol/L (ref 96–106)
Creatinine, Ser: 0.53 mg/dL — ABNORMAL LOW (ref 0.57–1.00)
Globulin, Total: 2.7 g/dL (ref 1.5–4.5)
Glucose: 130 mg/dL — ABNORMAL HIGH (ref 70–99)
Potassium: 4.3 mmol/L (ref 3.5–5.2)
Sodium: 141 mmol/L (ref 134–144)
Total Protein: 6.8 g/dL (ref 6.0–8.5)
eGFR: 133 mL/min/{1.73_m2} (ref >59)

## 2023-08-31 LAB — HEMOGLOBIN A1C
Est. average glucose Bld gHb Est-mCnc: 131 mg/dL
Hgb A1c MFr Bld: 6.2 % — ABNORMAL HIGH (ref 4.8–5.6)

## 2023-08-31 NOTE — Telephone Encounter (Signed)
 Pharmacy Patient Advocate Encounter   Received notification from CoverMyMeds that prior authorization for Dexcom G7 Sensor is required/requested.   Insurance verification completed.   The patient is insured through [facility] .   Per test claim: PA required; PA submitted to above mentioned insurance via CoverMyMeds Key/confirmation #/[facility] Status is pending

## 2023-08-31 NOTE — Telephone Encounter (Signed)
 Pharmacy Patient Advocate Encounter  Received notification from [facility] that Prior Authorization for Dexcom G7 Sensor has been DENIED.  Full denial letter will be uploaded to the media tab. See denial reason below.   PA #/Case ID/Reference #: [id number]

## 2023-09-01 ENCOUNTER — Ambulatory Visit: Payer: Medicaid Other | Admitting: Neurology

## 2023-09-01 ENCOUNTER — Other Ambulatory Visit: Payer: Self-pay

## 2023-09-01 ENCOUNTER — Ambulatory Visit: Payer: Medicaid Other | Admitting: Adult Health

## 2023-09-01 ENCOUNTER — Encounter: Payer: Self-pay | Admitting: Neurology

## 2023-09-01 VITALS — BP 126/78 | HR 84 | Ht 61.0 in | Wt 185.0 lb

## 2023-09-01 DIAGNOSIS — G4485 Primary stabbing headache: Secondary | ICD-10-CM

## 2023-09-01 DIAGNOSIS — M542 Cervicalgia: Secondary | ICD-10-CM | POA: Diagnosis not present

## 2023-09-01 DIAGNOSIS — G43709 Chronic migraine without aura, not intractable, without status migrainosus: Secondary | ICD-10-CM | POA: Diagnosis not present

## 2023-09-01 DIAGNOSIS — D72829 Elevated white blood cell count, unspecified: Secondary | ICD-10-CM

## 2023-09-01 MED ORDER — RIZATRIPTAN BENZOATE 10 MG PO TABS: 10.0000 mg | ORAL_TABLET | ORAL | 11 refills | Status: AC | PRN
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
## 2023-09-05 ENCOUNTER — Encounter: Payer: Medicaid Other | Admitting: Physical Therapy

## 2023-09-12 ENCOUNTER — Encounter: Payer: Medicaid Other | Admitting: Physical Therapy

## 2023-09-16 ENCOUNTER — Other Ambulatory Visit: Payer: Self-pay | Admitting: "Endocrinology

## 2023-09-18 ENCOUNTER — Other Ambulatory Visit: Payer: Self-pay | Admitting: Nurse Practitioner

## 2023-09-18 DIAGNOSIS — Z6837 Body mass index (BMI) 37.0-37.9, adult: Secondary | ICD-10-CM

## 2023-09-18 DIAGNOSIS — K760 Fatty (change of) liver, not elsewhere classified: Secondary | ICD-10-CM

## 2023-09-18 DIAGNOSIS — E1169 Type 2 diabetes mellitus with other specified complication: Secondary | ICD-10-CM

## 2023-09-19 ENCOUNTER — Encounter: Payer: Medicaid Other | Admitting: Physical Therapy

## 2023-09-27 ENCOUNTER — Inpatient Hospital Stay

## 2023-09-27 ENCOUNTER — Inpatient Hospital Stay: Attending: Oncology | Admitting: Oncology

## 2023-09-27 ENCOUNTER — Encounter: Payer: Self-pay | Admitting: Oncology

## 2023-09-27 VITALS — BP 125/95 | HR 90 | Temp 98.2°F | Resp 18 | Ht 61.0 in | Wt 183.1 lb

## 2023-09-27 DIAGNOSIS — L508 Other urticaria: Secondary | ICD-10-CM | POA: Insufficient documentation

## 2023-09-27 DIAGNOSIS — L501 Idiopathic urticaria: Secondary | ICD-10-CM | POA: Diagnosis not present

## 2023-09-27 DIAGNOSIS — D72829 Elevated white blood cell count, unspecified: Secondary | ICD-10-CM

## 2023-09-27 LAB — CMP (CANCER CENTER ONLY)
ALT: 26 U/L (ref 0–44)
AST: 25 U/L (ref 15–41)
Albumin: 4.1 g/dL (ref 3.5–5.0)
Alkaline Phosphatase: 82 U/L (ref 38–126)
Anion gap: 13 (ref 5–15)
BUN: 11 mg/dL (ref 6–20)
CO2: 25 mmol/L (ref 22–32)
Calcium: 9.9 mg/dL (ref 8.9–10.3)
Chloride: 101 mmol/L (ref 98–111)
Creatinine: 0.59 mg/dL (ref 0.44–1.00)
GFR, Estimated: >60 mL/min (ref >60)
Glucose, Bld: 123 mg/dL — ABNORMAL HIGH (ref 70–99)
Potassium: 4 mmol/L (ref 3.5–5.1)
Sodium: 139 mmol/L (ref 135–145)
Total Bilirubin: 0.2 mg/dL (ref 0.0–1.2)
Total Protein: 7.4 g/dL (ref 6.5–8.1)

## 2023-09-27 LAB — CBC WITH DIFFERENTIAL (CANCER CENTER ONLY)
Abs Immature Granulocytes: 0.04 10*3/uL (ref 0.00–0.07)
Basophils Absolute: 0 10*3/uL (ref 0.0–0.1)
Basophils Relative: 0 %
Eosinophils Absolute: 0.1 10*3/uL (ref 0.0–0.5)
Eosinophils Relative: 1 %
HCT: 41.6 % (ref 36.0–46.0)
Hemoglobin: 14.1 g/dL (ref 12.0–15.0)
Immature Granulocytes: 0 %
Lymphocytes Relative: 22 %
Lymphs Abs: 2.8 10*3/uL (ref 0.7–4.0)
MCH: 30.3 pg (ref 26.0–34.0)
MCHC: 33.9 g/dL (ref 30.0–36.0)
MCV: 89.3 fL (ref 80.0–100.0)
Monocytes Absolute: 0.6 10*3/uL (ref 0.1–1.0)
Monocytes Relative: 4 %
Neutro Abs: 9.2 10*3/uL — ABNORMAL HIGH (ref 1.7–7.7)
Neutrophils Relative %: 73 %
Platelet Count: 346 10*3/uL (ref 150–400)
RBC: 4.66 MIL/uL (ref 3.87–5.11)
RDW: 12.2 % (ref 11.5–15.5)
WBC Count: 12.7 10*3/uL — ABNORMAL HIGH (ref 4.0–10.5)
nRBC: 0 % (ref 0.0–0.2)

## 2023-09-27 LAB — LACTATE DEHYDROGENASE: LDH: 149 U/L (ref 98–192)

## 2023-09-27 LAB — SEDIMENTATION RATE: Sed Rate: 12 mm/h (ref 0–22)

## 2023-09-27 LAB — C-REACTIVE PROTEIN: CRP: 1.5 mg/dL — ABNORMAL HIGH (ref <1.0)

## 2023-09-27 NOTE — Assessment & Plan Note (Signed)
 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 Managed with levocetirizine and famotidine . 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 No recent dermatology follow-up due to unsatisfactory past experiences.

## 2023-09-27 NOTE — Assessment & Plan Note (Signed)
 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   Differential diagnosis includes infection, inflammation, medication side effects, and bone marrow disorders.  Recent blood work shows no findings concerning for leukemia or bone marrow disorder. Predominantly elevated neutrophils suggest inflammation. No immature cells or abnormal white cell proportions.   Low suspicion for chronic leukemia or bone marrow disorder. 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  Labs today showed white count of 12,400 with ANC of 9200, normal differential.  We will check CRP, ESR, flow cytometry, BCR-ABL, JAK2, and rheumatoid factor tests for further evaluation.  Recently ANA was negative in February 2025.  Patient was provided reassurance that we are less likely to be dealing with acute bone marrow process.  Will call her in approximately 2 weeks to discuss results of remaining workup.  RTC in 4 months for follow-up with repeat labs

## 2023-09-27 NOTE — Progress Notes (Signed)
 Patient did not have any plans to harm herself and does not feel hopeless. She said she has battled depression for many years and does not wish to harm herself and has any plans in the future to do so.
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
## 2023-09-28 LAB — RHEUMATOID FACTOR: Rheumatoid fact SerPl-aCnc: <10 [IU]/mL (ref <14.0)

## 2023-09-28 LAB — CYCLIC CITRUL PEPTIDE ANTIBODY, IGG/IGA: CCP Antibodies IgG/IgA: 9 U (ref 0–19)

## 2023-09-30 LAB — BCR-ABL1 FISH
Cells Analyzed: 200
Cells Counted: 200

## 2023-10-05 LAB — JAK2 V617F RFX CALR/MPL/E12-15

## 2023-10-05 LAB — CALR +MPL + E12-E15  (REFLEX)

## 2023-10-06 ENCOUNTER — Encounter: Payer: Self-pay | Admitting: Nurse Practitioner

## 2023-10-11 ENCOUNTER — Inpatient Hospital Stay (HOSPITAL_BASED_OUTPATIENT_CLINIC_OR_DEPARTMENT_OTHER): Admitting: Oncology

## 2023-10-11 DIAGNOSIS — L508 Other urticaria: Secondary | ICD-10-CM

## 2023-10-11 DIAGNOSIS — D72829 Elevated white blood cell count, unspecified: Secondary | ICD-10-CM

## 2023-10-11 NOTE — Progress Notes (Signed)
 [facility]  HEMATOLOGY-ONCOLOGY ELECTRONIC VISIT PROGRESS NOTE  PATIENT NAME: Alyssa Bennett   MR#: [id number] DOB: 2001/03/16  DATE OF SERVICE: 10/11/2023  Patient Care Team: Early, Adriane Albe, NP as PCP - General (Nurse Practitioner) Maudine Sos, MD as PCP - Cardiology (Cardiology) Maudine Sos, MD as Attending Physician (Cardiology)  I connected with the patient via telephone conference and verified that I am speaking with the correct person using two identifiers. The patient's location is at home and I am providing care from the [facility].  I discussed the limitations, risks, security and privacy concerns of performing an evaluation and management service by e-visits and the availability of in person appointments. I also discussed with the patient that there may be a patient responsible charge related to this service. The patient expressed understanding and agreed to proceed.   ASSESSMENT & PLAN:   Alyssa Bennett is a 23 y.o.  lady with a past medical history of asthma, migraine, allergies, urticaria, anxiety/depression, was referred to our service in May 2025 for evaluation of leukocytosis.  Grossly negative hematological workup.  Likely reactionary to inflammation.  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   Differential diagnosis includes infection, inflammation, medication side effects, and bone marrow disorders.  Recent blood work shows no findings concerning for leukemia or bone marrow disorder. Predominantly elevated neutrophils suggest inflammation. No immature cells or abnormal white cell proportions.   Low suspicion for chronic leukemia or bone marrow disorder. 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  On her initial consultation with us  on 09/27/2023, labs showed white count of 12,400 with ANC of 9200, normal differential.  CRP was slightly  increased at 1.5 mg/dL.  ESR was normal at 12 mm/h.  Rheumatoid factor, anti-CCP antibodies were negative.  BCR/ABL were negative.  JAK2 V617F mutation analysis followed by reflex testing to include CALR, MPL, exon 12-15 mutations were all negative.  No evidence of primary myeloproliferative neoplasm. Recently ANA was negative in February 2025.   Patient was provided reassurance that we are not dealing with acute bone marrow process.  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 Managed with levocetirizine and famotidine . 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 No recent dermatology follow-up due to unsatisfactory past experiences.   I discussed the assessment and treatment plan with the patient. The patient was provided an opportunity to ask questions and all were answered. The patient agreed with the plan and demonstrated an understanding of the instructions. The patient was advised to call back or seek an in-person evaluation if the symptoms worsen or if the condition fails to improve as anticipated.    I spent 11 minutes over the phone with the patient reviewing test results, discuss management and coordination/planning of care.  Arlo Berber, MD 10/11/2023 5:36 PM [facility] - A DEPT OF Tom[facility] Fragmin. [facility] [address] [place] [address] Dept: [phone] Dept Fax: [phone]   INTERVAL HISTORY:  Please see above for problem oriented charting.  The purpose of today's discussion is to explain recent lab results and to formulate plan of care.  Discussed the use of AI scribe software for clinical note transcription with the patient, who gave verbal consent to proceed.  History of Present Illness Alyssa Bennett "Alyssa Bennett" is a 23 year old female  who presents for evaluation of  elevated white blood cell count and inflammation markers.  She has a history of elevated white blood cell count, with current levels at 12,700, slightly above normal. Previous counts have reached as high as 16,600 since 2014. Extensive workup has been conducted to rule out leukemia and other bone marrow disorders, all of which returned negative results.  Inflammation markers were assessed, showing a normal sedimentation rate but a slightly elevated C-reactive protein (CRP) at 1.5. The source of inflammation remains unidentified.  She experiences urticaria, which is controlled with cetirizine . Without this medication, she experiences severe hives.  Her thyroid  function was checked last year and was reported as normal.   SUMMARY OF HEMATOLOGY HISTORY:  Labs at her PCPs office on 08/30/2023 showed white count of 12,900 with ANC of 9400, normal differential.  Hemoglobin 14.1, platelet count normal at 394,000.  Previously labs on 05/29/2018 also showed white count of 12,900.  Given persistent leukocytosis, referral was sent to us  for further evaluation.  On review of records, she has had chronic leukocytosis with white count as high as 16,600 since 2014 at least.   No recent infections, fevers, chills, or night sweats. She notes unintentional weight loss, attributing her reduced appetite to being on Ozempic  for weight loss.   She has a history of skin issues, described as dysgraphia, with her skin breaking out easily for the past few years. She is on levocetirizine and famotidine  for this condition. An allergist suggested an autoimmune disease, but the specific type is unknown. She has seen a dermatologist in the past who described her condition as idiopathic.   She has asthma and uses albuterol  as needed and fluticasone  (Flonase ) regularly for sinus swelling. She occasionally smokes hemp products but denies tobacco use.   She reports joint pain, particularly in her knees, ankles, and shoulders, and  describes hypermobility in her joints. She has a family history of rheumatoid arthritis, with both her mother and brother affected. Previous tests for lupus and rheumatoid arthritis were negative.   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    Differential diagnosis includes infection, inflammation, medication side effects, and bone marrow disorders.  Recent blood work shows no findings concerning for leukemia or bone marrow disorder. Predominantly elevated neutrophils suggest inflammation. No immature cells or abnormal white cell proportions.    Low suspicion for chronic leukemia or bone marrow disorder. 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   On her initial consultation with us  on 09/27/2023, labs showed white count of 12,400 with ANC of 9200, normal differential.  CRP was slightly increased at 1.5 mg/dL.  ESR was normal at 12 mm/h.  Rheumatoid factor, anti-CCP antibodies were negative.  BCR/ABL were negative.  JAK2 V617F mutation analysis followed by reflex testing to include CALR, MPL, exon 12-15 mutations were all negative.  No evidence of primary myeloproliferative neoplasm. Recently ANA was negative in February 2025.   Patient was provided reassurance that we are not dealing with acute bone marrow process.  REVIEW OF SYSTEMS:    Review of Systems - Oncology  All other pertinent systems were reviewed with the patient and are negative.  I have reviewed the past medical history, past surgical history, social history and family history with the patient and they are unchanged from previous note.  ALLERGIES:  She is allergic to lactose  intolerance (gi) and zithromax [azithromycin].  MEDICATIONS:  Current Outpatient Medications  Medication Sig Dispense Refill   Accu-Chek  Softclix Lancets lancets 1 each by Other route 3 (three) times daily.     albuterol  (VENTOLIN  HFA) 108 (90 Base) MCG/ACT inhaler Inhale 2 puffs into the lungs every 6 (six) hours as needed. (Patient not taking: Reported on 09/27/2023) 8 g 1   azelastine  (ASTELIN ) 0.1 % nasal spray Place 2 sprays into both nostrils 2 (two) times daily as needed. Use in each nostril as directed 30 mL 5   Blood Glucose Monitoring Suppl DEVI 1 each by Does not apply route in the morning, at noon, and at bedtime. May substitute to any manufacturer covered by patient's insurance. 1 each 0   clotrimazole  (LOTRIMIN ) 1 % cream Apply 1 Application topically as needed (To relieve hives).     esomeprazole  (NEXIUM ) 40 MG capsule One tab by mouth at dinner time. 90 capsule 3   famotidine  (PEPCID ) 20 MG tablet Take 1 tablet (20 mg total) by mouth 2 (two) times daily as needed (hives). 60 tablet 5   fluticasone  (FLONASE ) 50 MCG/ACT nasal spray Place 2 sprays into both nostrils daily. 16 g 5   glucose blood (ACCU-CHEK GUIDE TEST) test strip Check blood sugars 1-2 times daily 100 each 12   glucose blood (ACCU-CHEK GUIDE) test strip Check once daily 100 each 0   Glucose Blood (BLOOD GLUCOSE TEST STRIPS) STRP 1 each by In Vitro route in the morning, at noon, and at bedtime. Accucheck guide  May substitute to any manufacturer covered by patient's insurance. 100 each 3   levocetirizine (XYZAL ) 5 MG tablet Take 1 tablet (5 mg total) by mouth 2 (two) times daily as needed for allergies (or hives). 60 tablet 5   lidocaine  (XYLOCAINE ) 5 % ointment Apply 1 Application topically 3 (three) times daily as needed. Peasize or 0.5g 35.44 g 0   norelgestromin -ethinyl estradiol  (XULANE) 150-35 MCG/24HR transdermal patch Place 1 patch onto the skin once a week. 9 patch 2   ondansetron  (ZOFRAN ) 8 MG tablet Take 1 tablet (8 mg total) by mouth every 8 (eight) hours as needed for nausea or vomiting. 20 tablet 3   ondansetron  (ZOFRAN -ODT) 4 MG disintegrating  tablet Take 1 tablet (4 mg total) by mouth every 8 (eight) hours as needed for nausea or vomiting. 30 tablet 2   rizatriptan  (MAXALT ) 10 MG tablet Take 1 tablet (10 mg total) by mouth as needed for migraine. May repeat in 2 hours if needed (Patient not taking: Reported on 09/27/2023) 12 tablet 11   Semaglutide , 1 MG/DOSE, (OZEMPIC , 1 MG/DOSE,) 2 MG/1.5ML SOPN Inject 1 mg into the skin once a week. When complete, increase to 2mg  pen. (Patient not taking: Reported on 09/27/2023) 3 mL 0   Semaglutide , 2 MG/DOSE, (OZEMPIC , 2 MG/DOSE,) 8 MG/3ML SOPN Inject 2 mg into the skin once a week. Max dose (Patient not taking: Reported on 09/27/2023) 9 mL 1   Semaglutide ,0.25 or 0.5MG /DOS, (OZEMPIC , 0.25 OR 0.5 MG/DOSE,) 2 MG/3ML SOPN Inject 0.5mg  in to the skin once a week for 4 weeks. Continue on the same dose if you continue to have nausea. 3 mL 2   traZODone  (DESYREL ) 50 MG tablet TAKE 1/2 TABLET TO 2 TABLET BY MOUTH AT BEDTIME AS NEEDED FOR SLEEP 180 tablet 1   Vitamin D , Ergocalciferol , (DRISDOL ) 1.25 MG (50000 UNIT) CAPS capsule Take 1 capsule (50,000 Units total) by mouth every 7 (seven) days. 12 capsule 3   VRAYLAR  1.5 MG capsule Take 1.5 mg by mouth daily.     No current facility-administered medications for this visit.    PHYSICAL EXAMINATION:    Onc Performance Status - 10/11/23 1700       ECOG Perf Status   ECOG Perf Status Restricted in physically strenuous activity but ambulatory and able to carry out work of a light or sedentary nature, e.g., light house work, office work      KPS SCALE   KPS % SCORE Able to carry on normal activity, minor s/s of disease             LABORATORY DATA:   I have reviewed the data as listed.  Recent Results (from the past 2160 hours)  Alpha-Gal Panel     Status: None   Collection Time: 07/19/23  2:19 PM  Result Value Ref Range   Class Description Allergens Comment     Comment:     Levels of Specific IgE       Class  Description of Class      ---------------------------  -----  --------------------                    < 0.10         0         Negative            0.10 -    0.31         0/I       Equivocal/Low            0.32 -    0.55         I         Low            0.56 -    1.40         II        Moderate            1.41 -    3.90         III       High            3.91 -   19.00         IV        Very High           19.01 -  100.00         V         Very High                   >100.00         VI        Very High    IgE (Immunoglobulin E), Serum 245 6 - 495 IU/mL   Pork IgE <0.10 Class 0 kU/L   Beef IgE <0.10 Class 0 kU/L   Allergen Lamb IgE <0.10 Class 0 kU/L   O215-IgE Alpha-Gal <0.10 Class 0 kU/L  ANA w/Reflex     Status: None   Collection Time: 07/19/23  2:19 PM  Result Value Ref Range   Anti Nuclear Antibody (ANA) Negative Negative  Chronic Urticaria     Status: Abnormal   Collection Time: 07/19/23  2:19 PM  Result Value Ref Range   cu index 12.3 (H) <10    Comment: The CU Index(R) test is the second generation Functional Anti-FceR test.  Patients with a CU Index(R) greater than or equal to 10 have basophil reactive  factors in their serum which supports an autoimmune basis for disease. *This test was developed and its performance characteristics determined by [facility]. It has not been cleared or approved by the U.S. Food and Drug Administration.  FLAG Interpretation: A = Abnormal, H = High, L = Low   Tryptase     Status: None   Collection Time: 07/19/23  2:19 PM  Result Value Ref Range   Tryptase 3.2 2.2 - 13.2 ug/L  VITAMIN D  25 Hydroxy (Vit-D Deficiency, Fractures)     Status: None   Collection Time: 08/30/23  3:32 PM  Result Value Ref Range   Vit D, 25-Hydroxy 50.9 30.0 - 100.0 ng/mL    Comment: Vitamin D  deficiency has been defined by the Institute of Medicine and an Endocrine Society practice guideline as a level of serum 25-OH vitamin D  less than 20 ng/mL (1,2). The Endocrine Society went on  to further define vitamin D  insufficiency as a level between 21 and 29 ng/mL (2). 1. IOM (Institute of Medicine). 2010. Dietary reference    intakes for calcium and D. [place]: The    [employer]. 2. Holick MF, Binkley Canon, Bischoff-Ferrari HA, et al.    Evaluation, treatment, and prevention of vitamin D     deficiency: an Endocrine Society clinical practice    guideline. JCEM. 2011 Jul; 96(7):1911-30.   Vitamin B12     Status: None   Collection Time: 08/30/23  3:32 PM  Result Value Ref Range   Vitamin B-12 271 232 - 1,245 pg/mL  CBC with Differential/Platelet     Status: Abnormal   Collection Time: 08/30/23  3:32 PM  Result Value Ref Range   WBC 12.9 (H) 3.4 - 10.8 x10E3/uL   RBC 4.65 3.77 - 5.28 x10E6/uL   Hemoglobin 14.1 11.1 - 15.9 g/dL   Hematocrit 40.9 81.1 - 46.6 %   MCV 91 79 - 97 fL   MCH 30.3 26.6 - 33.0 pg   MCHC 33.4 31.5 - 35.7 g/dL   RDW 91.4 78.2 - 95.6 %   Platelets 394 150 - 450 x10E3/uL   Neutrophils 73 Not Estab. %   Lymphs 21 Not Estab. %   Monocytes 4 Not Estab. %   Eos 1 Not Estab. %   Basos 1 Not Estab. %   Neutrophils Absolute 9.4 (H) 1.4 - 7.0 x10E3/uL   Lymphocytes Absolute 2.7 0.7 - 3.1 x10E3/uL   Monocytes Absolute 0.5 0.1 - 0.9 x10E3/uL   EOS (ABSOLUTE) 0.2 0.0 - 0.4 x10E3/uL   Basophils Absolute 0.1 0.0 - 0.2 x10E3/uL   Immature Granulocytes 0 Not Estab. %   Immature Grans (Abs) 0.0 0.0 - 0.1 x10E3/uL  Comprehensive metabolic panel with GFR     Status: Abnormal   Collection Time: 08/30/23  3:32 PM  Result Value Ref Range   Glucose 130 (H) 70 - 99 mg/dL   BUN 9 6 - 20 mg/dL   Creatinine, Ser 2.13 (L) 0.57 - 1.00 mg/dL   eGFR 086 >57 QI/ONG/2.95   BUN/Creatinine Ratio 17 9 - 23   Sodium 141 134 - 144 mmol/L   Potassium 4.3 3.5 - 5.2 mmol/L   Chloride 103 96 - 106 mmol/L   CO2 22 20 - 29 mmol/L   Calcium 9.5 8.7 - 10.2 mg/dL   Total Protein 6.8 6.0 - 8.5 g/dL   Albumin 4.1 4.0 - 5.0 g/dL   Globulin, Total 2.7 1.5 - 4.5  g/dL   Bilirubin Total <2.8 0.0 - 1.2  mg/dL   Alkaline Phosphatase 87 44 - 121 IU/L   AST 21 0 - 40 IU/L   ALT 28 0 - 32 IU/L  Hemoglobin A1c     Status: Abnormal   Collection Time: 08/30/23  3:32 PM  Result Value Ref Range   Hgb A1c MFr Bld 6.2 (H) 4.8 - 5.6 %    Comment:          Prediabetes: 5.7 - 6.4          Diabetes: >6.4          Glycemic control for adults with diabetes: <7.0    Est. average glucose Bld gHb Est-mCnc 131 mg/dL  Cyclic Citrul Peptide Ab, IgG, IgA     Status: None   Collection Time: 09/27/23  2:54 PM  Result Value Ref Range   CCP Antibodies IgG/IgA 9 0 - 19 units    Comment: (NOTE)                          Negative               <20                          Weak positive      20 - 39                          Moderate positive  40 - 59                          Strong positive        >59 Performed At: [facility] [employer] [address] [place], [place] [id number] Pearlean Botts MD WU:[id number]   Rheumatoid factor     Status: None   Collection Time: 09/27/23  2:54 PM  Result Value Ref Range   Rheumatoid fact SerPl-aCnc <10.0 <14.0 IU/mL    Comment: (NOTE) Performed At: [facility] [address] [place], [place] [id number] Pearlean Botts MD VH:[id number]   JAK2 V617F rfx CALR/MPL/E12-15     Status: None   Collection Time: 09/27/23  2:54 PM  Result Value Ref Range   Specimen Type Comment:     Comment: NOT PROVIDED CORRECTED ON 05/14 AT 0936: PREVIOUSLY REPORTED AS DRAWN BY RN    JAK2 V617F Result Comment     Comment: (NOTE) NEGATIVE The JAK2 V617F mutation is not detected in the provided specimen of this individual. Results should be interpreted in conjunction with clinical and other laboratory findings for the most accurate interpretation. This test was developed and its performance characteristics determined by [facility]. It has not been cleared or approved by the Food and Drug Administration.    Reflex Comment     Comment:  (NOTE) Reflex to CALR Mutation Analysis, JAK2 Exon 12-15 Mutation Analysis, and MPL Mutation Analysis is indicated.    V617F Rfx CALR/MPL/E12-15 Bkgd Comment     Comment: (NOTE)    Molecular testing of blood or bone marrow is useful in the evaluation of suspected myeloproliferative neoplasms (MPN). Mutations in the JAK2, MPL, and CALR genes are present in virtually all MPNs and their presence help distinguish benign reactive processes from clonal neoplasms. These mutations are generally considered mutually exclusive, although concurrent clones have been reported in rare patients. This test will assess for the JAK2V617F (exon 14) mutation first and will reflex to CALR  mutation analysis, MPL mutation analysis, and JAK2 exon 12 to 15 mutation analysis if the JAK2V617F mutation is negative.    The JAK2 (Janus kinase 2) gene encodes for a non-receptor protein tyrosine kinase that activates cytokine and growth factor signaling. The V617F (c.1849 G>T) mutation results in constitutive activation of JAK2 and downstream STAT5 and ERK signaling. The V617F mutation is observed in approximately 95% of polycythemia vera (PV), 60% of essential thromboc ythemia (ET) and primary myelofibrosis (PMF). It is also infrequently present (3-5%) in myelodysplastic syndrome, chronic myelomonocytic leukemia, and other atypical chronic myeloid disorders. A small percentage of JAK2 mutation positive patients (3.3%) contain other non-V617F mutations within exons 12 to 15. In particular, mutations in exon 12 of JAK2 have been described in approximately 3% of patients with PV. JAK2 allele burden correlates with clinical phenotype, with low levels of mutant allele characterized by thrombocytosis, intermediate levels with erythrocytosis, and high mutant allele burden correlating with enhanced myelopoiesis of the BM, leukocytosis, increasing spleen size, and circulating CD34-positive cells.    The CALR (Calreticulin)  gene encodes for a multifunctional calcium-binding protein involved in many cellular activities such as growth, proliferation, adhesion, and programmed cell death. Among patients with JAK2 negative MPNs, CALR are found in a pproximately 70% of patients with JAK2-negative essential thrombocythemia (ET) and 60-88% of patients with JAK2-negative primary myelofibrosis(PMF). Only a minority of patients (approximately 8%) with myelodysplasia have mutations in the CALR gene. CALR mutations are rarely detected in patients with de novo acute myeloid leukemia, chronic myelogenous leukemia, lymphoid leukemia, or solid tumors. CALR mutations are not detected in polycythemia and generally appear to be mutually exclusive with JAK2 mutations and MPL mutations. The majority of mutational changes involve a variety of insertion deletion mutations in exon 9 of the calreticulin gene: approximately 53% of all CALR mutations are a 52 bp deletion (type-1) while the second most prevalent mutation (approximately 32%) contains a 5 bp insertion (type-2). Other mutations (non-type 1 or type 2) are seen in a small minority of cases. CALR mutations in PMF tend to be with a favorable prognosis compared to JAK2 V617F [employer], whereas primary myelofibrosis negative for CALR, JAK2 V617F and MPL mutations (so-called triple negative) is associated with a poor prognosis and shorter survival.    The MPL (myeloproliferative leukemia virus oncogene) gene encodes the thrombopoietin receptor which regulates hematopoiesis and megakaryopoiesis. Activating MPL mutations are associated with a subset of myeloproliferative neoplasms and acute megakaryoblastic leukemia. MPL W515 mutations are present in approximately 5-8% of patients with primary myelofibrosis (PMF) and 1-4% of patients with essential thrombocythemia (ET). The S505 mutation is detected in patients with hereditary thrombocythemia.    Limitations    This assay has  a sensitivity of approximately 1% VAF for JAK2 V617F, 2.5% VAF for other mutations in JAK2 exons 12 to 15, CALR mutations, and MPL mutations. Deletions in JAK2 up to 6 bp and insertions up to 34 bp have been detected in validation studies. Deletions in CALR up to 70 bp and i nsertions up to 12 bp have been detected in validation studies.    Method based next generation sequencing.     Comment: Comment [employer]    References Comment     Comment: (NOTE) Alghasham N, Alnouri Y, Abalkhail H, Wilfredo Hanly. Detection of mutations in JAK2 exons 12-15 by [employer] sequencing. Int J Lab Hematol. 2016 Feb;38(1):34-41. doi: 10.1111/ijlh.16109. Epub 2015 Sep 11. PMID: 60454098. Rana Burr, Charlie Constant, Hasserjian R, Serafin Dames, Borowitz MJ, Gina Lagos MM, [place]  CD, Cazzola M, Vardiman JW. The 2016 revision to the World Health Organization classification of myeloid neoplasms and acute leukemia. Blood. 2016 May 19;127(20):2391-405. doi: 10.1182/blood-2016-03-643544. Epub 2016 Apr 11. PMID: 16109604. Asa Bjork [facility], Zhang ZJ, [place] S, Albitar M. Mutation profile of JAK2 transcripts in patients with chronic myeloproliferative neoplasias. J Mol Diagn. 2009 Jan;11(1):49-53.doi: 10.2353/jmoldx.2009.080114. Epub 2008 Dec 12. PMID: 54098119; PMCID: [id number]. NCCN Clinical Practice Guidelines in Oncology (NCCN Guidelines) Myeloproliferative Neoplasms Version 3.2022 - January 01, 2021. Swerdlow SH, Programmer, multimedia. WHO classif ication of Tumours of Haematopoietic and Lymphoid Tissues. 4th edn. Johanna Mutter, [place]: Geologist, engineering for [employer] on [employer]; 2017. Tefferi A. Primary myelofibrosis: 2021 update on diagnosis, risk-stratification and management. Am J Hematol. 2021 Jan;96(1):145-162. doi: 10.1002/ajh.26050. Epub 2020 Dec 2. PMID: 30865784. Vainchenker W, Kralovics R. Genetic basis and molecular pathophysiology of classical myeloproliferative neoplasms. Blood. 2017 Feb 9;129(6):667-679. doi:  10.1182/blood-2016-10-695940. Epub 2016 Dec 27. PMID: 69629528.    Director Review Comment     Comment: (NOTE) Brendolyn Callas, PhD, [facility]    Director, Molecular Oncology    [facility]    Research [place], [place] [address]    [phone] Performed At: [facility] [address] [place], [place] [id number] Adams Adams MDPhD [id number]:[id number] Performed At: [facility] [address] [place], [place] [id number] Adams Adams MDPhD [id number]   Sedimentation rate     Status: None   Collection Time: 09/27/23  2:54 PM  Result Value Ref Range   Sed Rate 12 0 - 22 mm/hr    Comment: Performed at [facility] [facility], [address] [place], [place] [address]  C-reactive protein     Status: Abnormal   Collection Time: 09/27/23  2:54 PM  Result Value Ref Range   CRP 1.5 (H) <1.0 mg/dL    Comment: Performed at [facility], [address], [place], [place] [address]  Lactate dehydrogenase     Status: None   Collection Time: 09/27/23  2:54 PM  Result Value Ref Range   LDH 149 98 - 192 U/L    Comment: Performed at [facility], [address] [place], [place] [address]  CMP (Cancer Center only)     Status: Abnormal   Collection Time: 09/27/23  2:54 PM  Result Value Ref Range   Sodium 139 135 - 145 mmol/L   Potassium 4.0 3.5 - 5.1 mmol/L   Chloride 101 98 - 111 mmol/L   CO2 25 22 - 32 mmol/L   Glucose, Bld 123 (H) 70 - 99 mg/dL    Comment: Glucose reference range applies only to samples taken after fasting for at least 8 hours.   BUN 11 6 - 20 mg/dL   Creatinine 2.37 6.28 - 1.00 mg/dL   Calcium 9.9 8.9 - 31.5 mg/dL   Total Protein 7.4 6.5 - 8.1 g/dL   Albumin 4.1 3.5 - 5.0 g/dL   AST 25 15 - 41 U/L   ALT 26 0 - 44 U/L   Alkaline Phosphatase 82 38 - 126 U/L   Total Bilirubin 0.2 0.0 - 1.2 mg/dL   GFR, Estimated >17 >61 mL/min    Comment: (NOTE) Calculated using the CKD-EPI Creatinine Equation (2021)     Anion gap 13 5 - 15    Comment: Performed at [facility], [address] [place], [place] [address]  CBC with Differential (Cancer Center Only)     Status: Abnormal   Collection  Time: 09/27/23  2:54 PM  Result Value Ref Range   WBC Count 12.7 (H) 4.0 - 10.5 K/uL   RBC 4.66 3.87 - 5.11 MIL/uL   Hemoglobin 14.1 12.0 - 15.0 g/dL   HCT 44.0 10.2 - 72.5 %   MCV 89.3 80.0 - 100.0 fL   MCH 30.3 26.0 - 34.0 pg   MCHC 33.9 30.0 - 36.0 g/dL   RDW 36.6 44.0 - 34.7 %   Platelet Count 346 150 - 400 K/uL   nRBC 0.0 0.0 - 0.2 %   Neutrophils Relative % 73 %   Neutro Abs 9.2 (H) 1.7 - 7.7 K/uL   Lymphocytes Relative 22 %   Lymphs Abs 2.8 0.7 - 4.0 K/uL   Monocytes Relative 4 %   Monocytes Absolute 0.6 0.1 - 1.0 K/uL   Eosinophils Relative 1 %   Eosinophils Absolute 0.1 0.0 - 0.5 K/uL   Basophils Relative 0 %   Basophils Absolute 0.0 0.0 - 0.1 K/uL   Immature Granulocytes 0 %   Abs Immature Granulocytes 0.04 0.00 - 0.07 K/uL    Comment: Performed at [facility], [address] [place], [place] [address]  CALR +MPL + E12-E15 (reflexed)     Status: None   Collection Time: 09/27/23  2:54 PM  Result Value Ref Range   CALR Result Comment     Comment: (NOTE) NEGATIVE No insertions or deletions were detected within the analyzed region of the calreticulin (CALR) gene. A negative result does not entirely exclude the possibility of a clonal population carrying CALR gene mutations that are not covered by this assay. Results should be interpreted in conjunction with clinical and laboratory findings for the most accurate interpretation.    MPL Result Comment     Comment: (NOTE) NEGATIVE No MPL mutation was identified in the provided specimen of this individual. Results should be interpreted in conjunction with clinical and other laboratory findings for the most accurate interpretation.    E12-15 Result Comment     Comment: (NOTE) NEGATIVE    JAK2  mutations were not detected in exons 12, 13, 14 and 15. The G to T nucleotide change encoding the V617F mutation was not detected. This result does not rule out the presence of JAK2 mutation at a level below the detection sensitivity of this assay, the presence of other mutations outside the analyzed region of the JAK2 gene, or the presence of a myeloproliferative or other neoplasm. Result must be correlated with other clinical data for the most accurate diagnosis. Performed At: [facility] [address] [place], [place] [id number] Adams Adams MDPhD [id number]   BCR-ABL1 FISH     Status: None   Collection Time: 09/27/23  2:55 PM  Result Value Ref Range   Specimen Type BLOOD    Cells Counted 200    Cells Analyzed 200    FISH Result ABL1 GENE FUSION     Comment: Comment: NO BCR    Interpretation Comment:     Comment: (NOTE) NEGATIVE             nuc ish 9q34(ASS1,ABL1)x2,22q11.2(BCRx2)[200].      The fluorescence in situ hybridization (FISH) study was normal. FISH, using unique sequence DNA probes for the ABL1 and BCR gene regions showed two ABL1 signals (red), two control ASS1 gene signals (aqua) located adjacent to the ABL1 locus at 9q34, and two BCR signals (green) at 22q11.2 in all interphase nuclei examined. There was NO evidence of CML or  ALL-associated BCR::ABL1 dual fusion signals in this analysis. .      In those cases where blood is submitted for BCR::ABL1 FISH, the results should be interpreted in conjunction with PMN ratios. .      Chromosome analysis should be considered to identify clonal alterations not targeted by the FISH probes ordered. The TargetGene FISH result should be interpreted within the context of a full hematopatholgy evaluation. The DNA probe vendor for this study was [employer], community).        This test was developed and its performance char acteristics determined by [employer] It  has not been cleared or approved by the U.S. Food and Drug Administration.    Director Review: Comment:     Comment: (NOTE) Fredderick Jacquet, PhD, [facility] Performed At: [facility] [address] [place], [place] [id number] Adams Adams MDPhD [id number]      RADIOGRAPHIC STUDIES:  No recent pertinent imaging studies available to review.  Orders Placed This Encounter  Procedures   CBC with Differential (Cancer Center Only)    Standing Status:   Future    Expected Date:   01/30/2024    Expiration Date:   10/10/2024   Lactate dehydrogenase    Standing Status:   Future    Expected Date:   01/30/2024    Expiration Date:   10/10/2024   C-reactive protein    Standing Status:   Future    Expected Date:   01/30/2024    Expiration Date:   10/10/2024   Sedimentation rate    Standing Status:   Future    Expected Date:   01/30/2024    Expiration Date:   10/10/2024     Future Appointments  Date Time Provider Department Center  11/10/2023  1:20 PM Zuleta, Kaitlin G, NP [facility]  01/03/2024  3:45 PM Early, Adriane Albe, NP [facility]  01/20/2024  3:15 PM Kandice Orleans, MD [facility] None  01/30/2024  2:15 PM [facility] PHLEBOTOMIST [facility] None  01/30/2024  2:30 PM Francess Mullen, Gale Jude, MD [facility] None    This document was completed utilizing speech recognition software. Grammatical errors, random word insertions, pronoun errors, and incomplete sentences are an occasional consequence of this system due to software limitations, ambient noise, and hardware issues. Any formal questions or concerns about the content, text or information contained within the body of this dictation should be directly addressed to the provider for clarification.

## 2023-10-12 ENCOUNTER — Encounter: Payer: Self-pay | Admitting: Oncology

## 2023-10-12 NOTE — Assessment & Plan Note (Signed)
 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   Differential diagnosis includes infection, inflammation, medication side effects, and bone marrow disorders.  Recent blood work shows no findings concerning for leukemia or bone marrow disorder. Predominantly elevated neutrophils suggest inflammation. No immature cells or abnormal white cell proportions.   Low suspicion for chronic leukemia or bone marrow disorder. 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  On her initial consultation with us  on 09/27/2023, labs showed white count of 12,400 with ANC of 9200, normal differential.  CRP was slightly increased at 1.5 mg/dL.  ESR was normal at 12 mm/h.  Rheumatoid factor, anti-CCP antibodies were negative.  BCR/ABL were negative.  JAK2 V617F mutation analysis followed by reflex testing to include CALR, MPL, exon 12-15 mutations were all negative.  No evidence of primary myeloproliferative neoplasm. Recently ANA was negative in February 2025.   Patient was provided reassurance that we are not dealing with acute bone marrow process.  RTC in 4 months for follow-up with repeat labs

## 2023-10-12 NOTE — Assessment & Plan Note (Signed)
 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 Managed with levocetirizine and famotidine . 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 No recent dermatology follow-up due to unsatisfactory past experiences.

## 2023-11-10 ENCOUNTER — Ambulatory Visit: Admitting: Obstetrics and Gynecology

## 2023-11-29 ENCOUNTER — Other Ambulatory Visit (HOSPITAL_COMMUNITY): Payer: Self-pay

## 2023-11-29 ENCOUNTER — Other Ambulatory Visit: Payer: Self-pay | Admitting: Nurse Practitioner

## 2023-11-29 ENCOUNTER — Telehealth: Payer: Self-pay

## 2023-11-29 DIAGNOSIS — E1169 Type 2 diabetes mellitus with other specified complication: Secondary | ICD-10-CM

## 2023-11-29 DIAGNOSIS — K760 Fatty (change of) liver, not elsewhere classified: Secondary | ICD-10-CM

## 2023-11-29 DIAGNOSIS — Z6837 Body mass index (BMI) 37.0-37.9, adult: Secondary | ICD-10-CM

## 2023-11-29 NOTE — Telephone Encounter (Signed)
 Pharmacy Patient Advocate Encounter  Insurance verification completed.   The patient is insured through [facility]   Ran test claim for OZEMPIC . Currently a quantity of is a 28 day supply and the co-pay is 4.00 . The office has been made aware there is Authorization on file already for this drug, and The Pharmacy has been contacted to fill the Rx under the correct Insurance. Rx is now ready for pickup. Nothing further needed.   This test claim was processed through [facility]- copay amounts may vary at other pharmacies due to pharmacy/plan contracts, or as the patient moves through the different stages of their insurance plan.            T/C

## 2024-01-03 ENCOUNTER — Encounter: Payer: Self-pay | Admitting: Nurse Practitioner

## 2024-01-03 ENCOUNTER — Ambulatory Visit: Admitting: Nurse Practitioner

## 2024-01-03 VITALS — BP 118/74 | HR 95 | Wt 160.4 lb

## 2024-01-03 DIAGNOSIS — E1169 Type 2 diabetes mellitus with other specified complication: Secondary | ICD-10-CM

## 2024-01-03 DIAGNOSIS — Z23 Encounter for immunization: Secondary | ICD-10-CM

## 2024-01-03 DIAGNOSIS — J0120 Acute ethmoidal sinusitis, unspecified: Secondary | ICD-10-CM | POA: Diagnosis not present

## 2024-01-03 DIAGNOSIS — J029 Acute pharyngitis, unspecified: Secondary | ICD-10-CM | POA: Insufficient documentation

## 2024-01-03 MED ORDER — DOXYCYCLINE HYCLATE 100 MG PO TABS: 100.0000 mg | ORAL_TABLET | Freq: Two times a day (BID) | ORAL | 0 refills | Status: DC

## 2024-01-03 MED ORDER — SEMAGLUTIDE (1 MG/DOSE) 4 MG/3ML ~~LOC~~ SOPN: 1.0000 mg | PEN_INJECTOR | SUBCUTANEOUS | 0 refills | Status: DC

## 2024-01-03 NOTE — Progress Notes (Unsigned)
 Catheline Doing, DNP, AGNP-c [facility]  [address] [place], [place] [address] [phone]  ESTABLISHED PATIENT- Chronic Health and/or Follow-Up Visit  Blood pressure 118/74, pulse 95, weight 160 lb 6.4 oz (72.8 kg), last menstrual period 12/16/2023.    Alyssa Bennett is a 23 y.o. year old female presenting today for evaluation and management of chronic conditions.  She reports excellent control of her blood sugars and weight on semaglutide . She has not had any significant side effects from the medication and has been able to manage her diet appropriately. Exercise is difficult due to hypermobility of her joints, but she is working to remain active.   She has concerns with sinus pressure, congestion, and sore throat that started recently. She does not think she has had any fevers.   All ROS negative with exception of what is listed above.   PHYSICAL EXAM Physical Exam Vitals and nursing note reviewed.  Constitutional:      Appearance: Normal appearance. She is not ill-appearing.  HENT:     Head: Normocephalic.     Right Ear: Tympanic membrane normal.     Left Ear: Tympanic membrane normal.     Nose: Congestion and rhinorrhea present.     Mouth/Throat:     Mouth: Mucous membranes are moist.     Pharynx: Posterior oropharyngeal erythema present.  Eyes:     Pupils: Pupils are equal, round, and reactive to light.  Neck:     Vascular: No carotid bruit.  Cardiovascular:     Rate and Rhythm: Normal rate and regular rhythm.     Pulses: Normal pulses.     Heart sounds: Normal heart sounds.  Pulmonary:     Effort: Pulmonary effort is normal.     Breath sounds: Normal breath sounds.  Abdominal:     General: Bowel sounds are normal.     Palpations: Abdomen is soft.  Musculoskeletal:     Cervical back: No tenderness.     Right lower leg: No edema.     Left lower leg: No edema.  Lymphadenopathy:     Cervical: Cervical adenopathy present.  Skin:     General: Skin is warm and dry.     Capillary Refill: Capillary refill takes less than 2 seconds.  Neurological:     Mental Status: She is alert and oriented to person, place, and time.     Sensory: No sensory deficit.     Motor: No weakness.  Psychiatric:        Mood and Affect: Mood normal.        Behavior: Behavior normal.      PLAN Problem List Items Addressed This Visit     Diabetes mellitus (HCC) - Primary   Doing very well on ozempic  for management. She is controlling nausea and constipation well. She has made significant improvements to her diet and activity. We will obtain A1c today for monitoring and plan to continue on Ozempic  at 1mg  per week. If changes are needed, she will reach out. We discussed the option to stay on the current dose until she feels her hunger is coming back and the medication is not controlling her glucose levels.       Relevant Medications   Semaglutide , 1 MG/DOSE, 4 MG/3ML SOPN   Other Relevant Orders   POCT glycosylated hemoglobin (Hb A1C) (Completed)   Acute non-recurrent ethmoidal sinusitis   Symptoms consistent with sinusitis. Will treat with doxycycline  today for suspected bacterial component and monitor closely. Recommend rest and hydration.  F/U if no improvement.       Relevant Medications   doxycycline  (VIBRA -TABS) 100 MG tablet   Other Visit Diagnoses       Need for Tdap vaccination       Relevant Orders   Tdap vaccine greater than or equal to 7yo IM (Completed)       Return in about 4 months (around 05/04/2024) for Med Management 30- needs sick note for today.  SaraBeth Djuna Frechette, DNP, AGNP-c Time: 37 minutes, >50% spent counseling, care coordination, chart review, and documentation.

## 2024-01-03 NOTE — Patient Instructions (Addendum)
 You can take 2000mg  of tylenol  a day (4 extra strength tylenol ) safely.   You can take pseudoephedrine  if you would like. It will not interact with any of your medications.

## 2024-01-04 ENCOUNTER — Other Ambulatory Visit (HOSPITAL_COMMUNITY): Payer: Self-pay

## 2024-01-04 LAB — POCT GLYCOSYLATED HEMOGLOBIN (HGB A1C): Hemoglobin A1C: 5.4 % (ref 4.0–5.6)

## 2024-01-10 ENCOUNTER — Encounter: Payer: Self-pay | Admitting: Nurse Practitioner

## 2024-01-10 ENCOUNTER — Ambulatory Visit: Payer: Self-pay | Admitting: Nurse Practitioner

## 2024-01-10 DIAGNOSIS — J0120 Acute ethmoidal sinusitis, unspecified: Secondary | ICD-10-CM | POA: Insufficient documentation

## 2024-01-10 NOTE — Assessment & Plan Note (Signed)
 Doing very well on ozempic  for management. She is controlling nausea and constipation well. She has made significant improvements to her diet and activity. We will obtain A1c today for monitoring and plan to continue on Ozempic  at 1mg  per week. If changes are needed, she will reach out. We discussed the option to stay on the current dose until she feels her hunger is coming back and the medication is not controlling her glucose levels.

## 2024-01-10 NOTE — Assessment & Plan Note (Signed)
 Symptoms consistent with sinusitis. Will treat with doxycycline  today for suspected bacterial component and monitor closely. Recommend rest and hydration. F/U if no improvement.

## 2024-01-13 ENCOUNTER — Encounter: Payer: Self-pay | Admitting: Nurse Practitioner

## 2024-01-17 ENCOUNTER — Other Ambulatory Visit: Payer: Self-pay | Admitting: Nurse Practitioner

## 2024-01-17 DIAGNOSIS — T364X5A Adverse effect of tetracyclines, initial encounter: Secondary | ICD-10-CM

## 2024-01-18 NOTE — Telephone Encounter (Signed)
 I have sched pt with Camie for tomorrow.

## 2024-01-19 ENCOUNTER — Encounter: Payer: Self-pay | Admitting: Nurse Practitioner

## 2024-01-19 ENCOUNTER — Ambulatory Visit: Admitting: Nurse Practitioner

## 2024-01-19 VITALS — BP 122/74 | HR 103 | Wt 156.4 lb

## 2024-01-19 DIAGNOSIS — N946 Dysmenorrhea, unspecified: Secondary | ICD-10-CM | POA: Diagnosis not present

## 2024-01-19 DIAGNOSIS — R8290 Unspecified abnormal findings in urine: Secondary | ICD-10-CM

## 2024-01-19 DIAGNOSIS — L270 Generalized skin eruption due to drugs and medicaments taken internally: Secondary | ICD-10-CM | POA: Diagnosis not present

## 2024-01-19 DIAGNOSIS — N83209 Unspecified ovarian cyst, unspecified side: Secondary | ICD-10-CM | POA: Diagnosis not present

## 2024-01-19 DIAGNOSIS — T364X5A Adverse effect of tetracyclines, initial encounter: Secondary | ICD-10-CM

## 2024-01-19 DIAGNOSIS — T50905A Adverse effect of unspecified drugs, medicaments and biological substances, initial encounter: Secondary | ICD-10-CM | POA: Diagnosis not present

## 2024-01-19 LAB — POCT URINALYSIS DIP (CLINITEK)
Bilirubin, UA: NEGATIVE
Glucose, UA: NEGATIVE mg/dL
Nitrite, UA: NEGATIVE
POC PROTEIN,UA: NEGATIVE
Spec Grav, UA: 1.01 (ref 1.010–1.025)
Urobilinogen, UA: 0.2 U/dL
pH, UA: 6 (ref 5.0–8.0)

## 2024-01-19 MED ORDER — NORETHIN ACE-ETH ESTRAD-FE 1-20 MG-MCG PO TABS: 1.0000 | ORAL_TABLET | Freq: Every day | ORAL | 3 refills | Status: AC

## 2024-01-19 MED ORDER — AMOXICILLIN 500 MG PO CAPS: 500.0000 mg | ORAL_CAPSULE | Freq: Two times a day (BID) | ORAL | 0 refills | Status: AC

## 2024-01-19 NOTE — Progress Notes (Signed)
 Camie FORBES Doing, DNP, AGNP-c [facility] [address] [place], [place] [address] [phone]   ACUTE VISIT on 01/19/2024  Blood pressure 122/74, pulse (!) 103, weight 156 lb 6.4 oz (70.9 kg), last menstrual period 12/16/2023.  Subjective:  HPI Alyssa Bennett is here today for follow-up after suspected allergic reaction to doxycycline  this week. She was on antibiotics for sinusitis. About 3 days after starting treatment she began to notice red bumps scattered on her neck, trunk, and arms. She also felt her neck was turning yellow and had concerns with her liver function. She does have a history of fatty liver. At this time the rash is improving significantly and she is not longer itching. She has taken benadryl  and famotidine  for the rash.    ROS negative except for what is listed in HPI. History, Medications, Surgery, SDOH, and Family History reviewed and updated as appropriate.  Objective:  Physical Exam Vitals and nursing note reviewed.  Constitutional:      General: She is not in acute distress.    Appearance: Normal appearance. She is not ill-appearing, toxic-appearing or diaphoretic.  HENT:     Head: Normocephalic.  Eyes:     Conjunctiva/sclera: Conjunctivae normal.     Pupils: Pupils are equal, round, and reactive to light.  Cardiovascular:     Rate and Rhythm: Normal rate and regular rhythm.     Pulses: Normal pulses.     Heart sounds: Normal heart sounds.  Pulmonary:     Effort: Pulmonary effort is normal.     Breath sounds: Normal breath sounds.  Abdominal:     General: Bowel sounds are normal.     Palpations: Abdomen is soft.  Musculoskeletal:     Right lower leg: No edema.     Left lower leg: No edema.  Lymphadenopathy:     Cervical: No cervical adenopathy.  Skin:    General: Skin is warm.     Capillary Refill: Capillary refill takes less than 2 seconds.     Coloration: Skin is not jaundiced or pale.     Findings: Rash present. No erythema. Rash is  papular.     Comments: Scattered pink macules and papules noted on the neck and trunk. Skin coloration WNL  Neurological:     General: No focal deficit present.     Mental Status: She is alert and oriented to person, place, and time.     Sensory: No sensory deficit.     Motor: No weakness.     Coordination: Coordination normal.  Psychiatric:        Mood and Affect: Mood normal.         Assessment & Plan:   Problem List Items Addressed This Visit     Drug reaction - Primary   Allergic reaction to doxycycline  with urticarial rash on trunk, upper extremities, and neck. Urine normal coloration with no concerns present (blood in urine due to menses). No abdominal tenderness noted. No scleral icterus. Skin coloration WNL outside of the rash. Will obtain labs today for further evaluation of liver function. Continue with famotidine  and benadryl  while pruritus is present. Follow-up with changes or based on labs      Relevant Orders   Comprehensive metabolic panel (Completed)   Other Visit Diagnoses       Bad odor of urine       Relevant Orders   POCT URINALYSIS DIP (CLINITEK) (Completed)     Dysmenorrhea       Relevant Medications   amoxicillin  (  AMOXIL ) 500 MG capsule     Cyst of ovary, unspecified laterality       Relevant Medications   norethindrone-ethinyl estradiol -FE (LOESTRIN FE) 1-20 MG-MCG tablet       Camie FORBES Doing, DNP, AGNP-c Time: 30 minutes, >50% spent counseling, care coordination, chart review, and documentation.

## 2024-01-19 NOTE — Patient Instructions (Signed)

## 2024-01-20 ENCOUNTER — Ambulatory Visit: Payer: Medicaid Other | Admitting: Internal Medicine

## 2024-01-20 ENCOUNTER — Ambulatory Visit: Payer: Self-pay | Admitting: Nurse Practitioner

## 2024-01-20 LAB — COMPREHENSIVE METABOLIC PANEL WITH GFR
ALT: 16 IU/L (ref 0–32)
AST: 18 IU/L (ref 0–40)
Albumin: 4.5 g/dL (ref 4.0–5.0)
Alkaline Phosphatase: 82 IU/L (ref 44–121)
BUN/Creatinine Ratio: 17 (ref 9–23)
BUN: 11 mg/dL (ref 6–20)
Bilirubin Total: 0.3 mg/dL (ref 0.0–1.2)
CO2: 20 mmol/L (ref 20–29)
Calcium: 9.6 mg/dL (ref 8.7–10.2)
Chloride: 100 mmol/L (ref 96–106)
Creatinine, Ser: 0.63 mg/dL (ref 0.57–1.00)
Globulin, Total: 2.9 g/dL (ref 1.5–4.5)
Glucose: 94 mg/dL (ref 70–99)
Potassium: 3.9 mmol/L (ref 3.5–5.2)
Sodium: 140 mmol/L (ref 134–144)
Total Protein: 7.4 g/dL (ref 6.0–8.5)
eGFR: 127 mL/min/1.73 (ref >59)

## 2024-01-22 DIAGNOSIS — T50905A Adverse effect of unspecified drugs, medicaments and biological substances, initial encounter: Secondary | ICD-10-CM | POA: Insufficient documentation

## 2024-01-22 NOTE — Assessment & Plan Note (Signed)
 Allergic reaction to doxycycline  with urticarial rash on trunk, upper extremities, and neck. Urine normal coloration with no concerns present (blood in urine due to menses). No abdominal tenderness noted. No scleral icterus. Skin coloration WNL outside of the rash. Will obtain labs today for further evaluation of liver function. Continue with famotidine  and benadryl  while pruritus is present. Follow-up with changes or based on labs

## 2024-01-27 ENCOUNTER — Telehealth: Payer: Self-pay | Admitting: Oncology

## 2024-01-27 NOTE — Telephone Encounter (Signed)
Called to reschedule appt

## 2024-01-30 ENCOUNTER — Inpatient Hospital Stay: Admitting: Oncology

## 2024-01-30 ENCOUNTER — Inpatient Hospital Stay

## 2024-02-13 ENCOUNTER — Inpatient Hospital Stay: Admitting: Oncology

## 2024-02-13 ENCOUNTER — Inpatient Hospital Stay: Attending: Oncology

## 2024-02-13 ENCOUNTER — Telehealth: Payer: Self-pay

## 2024-02-13 NOTE — Telephone Encounter (Signed)
 Attempt X 1 to reach pt regarding appts today at [facility]. No answer. Will have scheduling reach out to reschedule appts

## 2024-02-15 ENCOUNTER — Telehealth: Payer: Self-pay | Admitting: Oncology

## 2024-02-15 NOTE — Telephone Encounter (Signed)
 Called Pt to reschedule missed FU appts. Day and time confirmed with PT.

## 2024-03-12 ENCOUNTER — Inpatient Hospital Stay: Admitting: Oncology

## 2024-03-12 ENCOUNTER — Inpatient Hospital Stay: Attending: Oncology

## 2024-03-12 ENCOUNTER — Encounter: Payer: Self-pay | Admitting: Oncology

## 2024-03-12 VITALS — BP 120/88 | HR 90 | Temp 98.4°F | Resp 15 | Ht 61.0 in | Wt 155.1 lb

## 2024-03-12 DIAGNOSIS — D72829 Elevated white blood cell count, unspecified: Secondary | ICD-10-CM | POA: Diagnosis present

## 2024-03-12 LAB — CBC WITH DIFFERENTIAL (CANCER CENTER ONLY)
Abs Immature Granulocytes: 0.02 K/uL (ref 0.00–0.07)
Basophils Absolute: 0.1 K/uL (ref 0.0–0.1)
Basophils Relative: 1 %
Eosinophils Absolute: 0.2 K/uL (ref 0.0–0.5)
Eosinophils Relative: 2 %
HCT: 42.4 % (ref 36.0–46.0)
Hemoglobin: 14.5 g/dL (ref 12.0–15.0)
Immature Granulocytes: 0 %
Lymphocytes Relative: 31 %
Lymphs Abs: 3.2 K/uL (ref 0.7–4.0)
MCH: 30.4 pg (ref 26.0–34.0)
MCHC: 34.2 g/dL (ref 30.0–36.0)
MCV: 88.9 fL (ref 80.0–100.0)
Monocytes Absolute: 0.5 K/uL (ref 0.1–1.0)
Monocytes Relative: 4 %
Neutro Abs: 6.5 K/uL (ref 1.7–7.7)
Neutrophils Relative %: 62 %
Platelet Count: 300 K/uL (ref 150–400)
RBC: 4.77 MIL/uL (ref 3.87–5.11)
RDW: 12.2 % (ref 11.5–15.5)
WBC Count: 10.4 K/uL (ref 4.0–10.5)
nRBC: 0 % (ref 0.0–0.2)

## 2024-03-12 LAB — C-REACTIVE PROTEIN: CRP: 0.7 mg/dL (ref <1.0)

## 2024-03-12 LAB — LACTATE DEHYDROGENASE: LDH: 146 U/L (ref 98–192)

## 2024-03-12 LAB — SEDIMENTATION RATE: Sed Rate: 5 mm/h (ref 0–22)

## 2024-03-12 NOTE — Progress Notes (Signed)
 [facility]  HEMATOLOGY CLINIC PROGRESS NOTE  PATIENT NAME: Alyssa Bennett   MR#: [id number] DOB: 09/27/2000  Patient Care Team: Early, Camie BRAVO, NP as PCP - General (Nurse Practitioner) Raford Riggs, MD as PCP - Cardiology (Cardiology) Raford Riggs, MD as Attending Physician (Cardiology)  Date of visit: 03/12/2024   ASSESSMENT & PLAN:   Alyssa Bennett is a 23 y.o. lady with a past medical history of asthma, migraine, allergies, urticaria, anxiety/depression, was referred to our service in May 2025 for evaluation of leukocytosis.  Grossly negative hematological workup.  Likely reactionary to inflammation.  Testing negative for primary MPN.  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   Differential diagnoses considered included infection, inflammation, medication side effects, and bone marrow disorders.  Recent blood work shows no findings concerning for leukemia or bone marrow disorder. Predominantly elevated neutrophils suggest inflammation. No immature cells or abnormal white cell proportions.   Low suspicion for chronic leukemia or bone marrow disorder. 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  On her initial consultation with us  on 09/27/2023, labs showed white count of 12,400 with ANC of 9200, normal differential.  CRP was slightly increased at 1.5 mg/dL.  ESR was normal at 12 mm/h.  Rheumatoid factor, anti-CCP antibodies were negative.  BCR/ABL were negative.  JAK2 V617F mutation analysis followed by reflex testing to include CALR, MPL, exon 12-15 mutations were all negative.  No evidence of primary myeloproliferative neoplasm. Recently ANA was negative in February 2025.   Patient was provided reassurance that we are not dealing with acute bone marrow process.  Current white blood cell count is 10,400, within normal range. All other blood counts, including  red count and platelets, are normal. Previous workup for bone marrow mutations and inflammatory markers showed no concerning findings, with only a slightly elevated CRP. The elevated white count was likely due to inflammation.   Recent weight loss of 30-40 pounds and use of Ozempic  may have contributed to the normalization of the white count.  Since no additional hematological workup or intervention is needed, she can be discharged from our office for continued follow-up with her PCP.  Please reconsult us  as necessary.  - Monitor blood counts every six months or annually with primary care provider. - We plan to re-evaluate if white count exceeds 20,000 or if other blood counts become abnormal.    I spent a total of 20 minutes during this encounter with the patient including review of chart and various tests results, discussions about plan of care and coordination of care plan.  I reviewed lab results and outside records for this visit and discussed relevant results with the patient. Diagnosis, plan of care and treatment options were also discussed in detail with the patient. Opportunity provided to ask questions and answers provided to her apparent satisfaction. Provided instructions to call our clinic with any problems, questions or concerns prior to return visit. I recommended to continue follow-up with PCP and sub-specialists. She verbalized understanding and agreed with the plan. No barriers to learning was detected.  Chinita Patten, MD  03/12/2024 3:19 PM  [facility] - A DEPT OF [facility] [address] [place] [address] Dept: [phone] Dept Fax: [phone]   CHIEF COMPLAINT/ REASON FOR VISIT:  Follow-up for chronic, mild leukocytosis, likely reactionary.  Testing negative for primary MPN.  INTERVAL HISTORY:  Discussed the use of  AI scribe software for clinical note transcription with the patient,  who gave verbal consent to proceed.  History of Present Illness Alyssa Bennett is a 23 year old female who presents for follow-up regarding elevated white blood cell count.  She has a history of elevated white blood cell count, with previous counts reaching up to 16,000 in 2023 and 2014. Her current white blood cell count is 10,400. Previous investigations, including tests for bone marrow mutations, returned negative results. Her red blood cell and platelet counts have consistently been normal.  No fevers, chills, night sweats, or infections recently. She mentions a past sinus infection which led to the discovery of her allergy  to doxycycline , as she developed hives in response to the medication.  She has lost approximately 30 to 40 pounds over the past four to five months, which she attributes to the use of Ozempic , noting it helps with inflammation. She typically has her labs checked every six months with her primary care provider.  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    SUMMARY OF HEMATOLOGIC HISTORY:  Labs at her PCPs office on 08/30/2023 showed white count of 12,900 with ANC of 9400, normal differential.  Hemoglobin 14.1, platelet count normal at 394,000.  Previously labs on 05/29/2018 also showed white count of 12,900.  Given persistent leukocytosis, referral was sent to us  for further evaluation.  On review of records, she has had chronic leukocytosis with white count as high as 16,600 since 2014 at least.   No recent infections, fevers, chills, or night sweats. She notes unintentional weight loss, attributing her reduced appetite to being on Ozempic  for weight loss.   She has a history of skin issues, described as dysgraphia, with her skin breaking out easily for the past few years. She is on levocetirizine and famotidine  for this condition. An allergist suggested an autoimmune disease, but the specific type is unknown. She has seen a  dermatologist in the past who described her condition as idiopathic.   She has asthma and uses albuterol  as needed and fluticasone  (Flonase ) regularly for sinus swelling. She occasionally smokes hemp products but denies tobacco use.   She reports joint pain, particularly in her knees, ankles, and shoulders, and describes hypermobility in her joints. She has a family history of rheumatoid arthritis, with both her mother and brother affected. Previous tests for lupus and rheumatoid arthritis were negative.   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    Differential diagnoses considered included infection, inflammation, medication side effects, and bone marrow disorders.  Recent blood work shows no findings concerning for leukemia or bone marrow disorder. Predominantly elevated neutrophils suggest inflammation. No immature cells or abnormal white cell proportions.    Low suspicion for chronic leukemia or bone marrow disorder. 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   On her initial consultation with us  on 09/27/2023, labs showed white count of 12,400 with ANC of 9200, normal differential.  CRP was slightly increased at 1.5 mg/dL.  ESR was normal at 12 mm/h.  Rheumatoid factor, anti-CCP antibodies were negative.  BCR/ABL were negative.  JAK2 V617F mutation analysis followed by reflex testing to include CALR, MPL, exon 12-15 mutations were all negative.  No evidence of primary myeloproliferative neoplasm. Recently ANA was negative in February 2025.   Patient  was provided reassurance that we are not dealing with acute bone marrow process.  I have reviewed the past medical history, past surgical history, social history and family history with the patient and they are unchanged from previous  note.  ALLERGIES: She is allergic to doxycycline , zithromax [azithromycin], and lactose intolerance (gi).  MEDICATIONS:  Current Outpatient Medications  Medication Sig Dispense Refill   VRAYLAR 3 MG capsule Take 3 mg by mouth daily.     Accu-Chek Softclix Lancets lancets 1 each by Other route 3 (three) times daily.     albuterol  (VENTOLIN  HFA) 108 (90 Base) MCG/ACT inhaler Inhale 2 puffs into the lungs every 6 (six) hours as needed. 8 g 1   amphetamine -dextroamphetamine  (ADDERALL XR) 10 MG 24 hr capsule Take 10 mg by mouth every morning.     azelastine  (ASTELIN ) 0.1 % nasal spray Place 2 sprays into both nostrils 2 (two) times daily as needed. Use in each nostril as directed 30 mL 5   Blood Glucose Monitoring Suppl DEVI 1 each by Does not apply route in the morning, at noon, and at bedtime. May substitute to any manufacturer covered by patient's insurance. 1 each 0   clotrimazole  (LOTRIMIN ) 1 % cream Apply 1 Application topically as needed (To relieve hives).     esomeprazole  (NEXIUM ) 40 MG capsule One tab by mouth at dinner time. 90 capsule 3   famotidine  (PEPCID ) 20 MG tablet Take 1 tablet (20 mg total) by mouth 2 (two) times daily as needed (hives). 60 tablet 5   fluticasone  (FLONASE ) 50 MCG/ACT nasal spray Place 2 sprays into both nostrils daily. 16 g 5   glucose blood (ACCU-CHEK GUIDE TEST) test strip Check blood sugars 1-2 times daily 100 each 12   glucose blood (ACCU-CHEK GUIDE) test strip Check once daily 100 each 0   levocetirizine (XYZAL ) 5 MG tablet Take 1 tablet (5 mg total) by mouth 2 (two) times daily as needed for allergies (or hives). 60 tablet 5   lidocaine  (XYLOCAINE ) 5 % ointment Apply 1 Application topically 3 (three) times daily as needed. Peasize or 0.5g 35.44 g 0   norethindrone-ethinyl estradiol -FE (LOESTRIN FE) 1-20 MG-MCG tablet Take 1 tablet by mouth daily. 84 tablet 3   ondansetron  (ZOFRAN ) 8 MG tablet Take 1 tablet (8 mg total) by mouth every 8 (eight) hours as  needed for nausea or vomiting. 20 tablet 3   ondansetron  (ZOFRAN -ODT) 4 MG disintegrating tablet Take 1 tablet (4 mg total) by mouth every 8 (eight) hours as needed for nausea or vomiting. 30 tablet 2   rizatriptan  (MAXALT ) 10 MG tablet Take 1 tablet (10 mg total) by mouth as needed for migraine. May repeat in 2 hours if needed 12 tablet 11   Semaglutide , 1 MG/DOSE, 4 MG/3ML SOPN Inject 1 mg as directed once a week. 9 mL 0   traZODone  (DESYREL ) 50 MG tablet TAKE 1/2 TABLET TO 2 TABLET BY MOUTH AT BEDTIME AS NEEDED FOR SLEEP 180 tablet 1   Vitamin D , Ergocalciferol , (DRISDOL ) 1.25 MG (50000 UNIT) CAPS capsule Take 1 capsule (50,000 Units total) by mouth every 7 (seven) days. 12 capsule 3   No current facility-administered medications for this visit.     REVIEW OF SYSTEMS:    Review of Systems - Oncology  All other pertinent systems were reviewed with the patient and are negative.  PHYSICAL EXAMINATION:   Onc Performance Status - 03/12/24 1448       ECOG Perf Status   ECOG Perf Status  Restricted in physically strenuous activity but ambulatory and able to carry out work of a light or sedentary nature, e.g., light house work, office work      KPS SCALE   KPS % SCORE Normal activity with effort, some s/s of disease          Vitals:   03/12/24 1440  BP: 120/88  Pulse: 90  Resp: 15  Temp: 98.4 F (36.9 C)  SpO2: 100%   Filed Weights   03/12/24 1440  Weight: 155 lb 1.6 oz (70.4 kg)    Physical Exam Constitutional:      General: She is not in acute distress.    Appearance: Normal appearance.  HENT:     Head: Normocephalic and atraumatic.  Cardiovascular:     Rate and Rhythm: Normal rate.  Pulmonary:     Effort: Pulmonary effort is normal. No respiratory distress.  Abdominal:     General: There is no distension.  Neurological:     General: No focal deficit present.     Mental Status: She is alert and oriented to person, place, and time.  Psychiatric:        Mood and  Affect: Mood normal.        Behavior: Behavior normal.     LABORATORY DATA:   I have reviewed the data as listed.  Results for orders placed or performed in visit on 03/12/24  Lactate dehydrogenase  Result Value Ref Range   LDH 146 98 - 192 U/L  CBC with Differential (Cancer Center Only)  Result Value Ref Range   WBC Count 10.4 4.0 - 10.5 K/uL   RBC 4.77 3.87 - 5.11 MIL/uL   Hemoglobin 14.5 12.0 - 15.0 g/dL   HCT 57.5 63.9 - 53.9 %   MCV 88.9 80.0 - 100.0 fL   MCH 30.4 26.0 - 34.0 pg   MCHC 34.2 30.0 - 36.0 g/dL   RDW 87.7 88.4 - 84.4 %   Platelet Count 300 150 - 400 K/uL   nRBC 0.0 0.0 - 0.2 %   Neutrophils Relative % 62 %   Neutro Abs 6.5 1.7 - 7.7 K/uL   Lymphocytes Relative 31 %   Lymphs Abs 3.2 0.7 - 4.0 K/uL   Monocytes Relative 4 %   Monocytes Absolute 0.5 0.1 - 1.0 K/uL   Eosinophils Relative 2 %   Eosinophils Absolute 0.2 0.0 - 0.5 K/uL   Basophils Relative 1 %   Basophils Absolute 0.1 0.0 - 0.1 K/uL   Immature Granulocytes 0 %   Abs Immature Granulocytes 0.02 0.00 - 0.07 K/uL     RADIOGRAPHIC STUDIES:  No recent pertinent imaging studies available to review.  No orders of the defined types were placed in this encounter.    Future Appointments  Date Time Provider Department Center  05/10/2024  3:45 PM Early, Sara E, NP [facility] [phone] [place]     This document was completed utilizing speech recognition software. Grammatical errors, random word insertions, pronoun errors, and incomplete sentences are an occasional consequence of this system due to software limitations, ambient noise, and hardware issues. Any formal questions or concerns about the content, text or information contained within the body of this dictation should be directly addressed to the provider for clarification.

## 2024-03-12 NOTE — Assessment & Plan Note (Addendum)
 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   Differential diagnoses considered included infection, inflammation, medication side effects, and bone marrow disorders.  Recent blood work shows no findings concerning for leukemia or bone marrow disorder. Predominantly elevated neutrophils suggest inflammation. No immature cells or abnormal white cell proportions.   Low suspicion for chronic leukemia or bone marrow disorder. 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  On her initial consultation with us  on 09/27/2023, labs showed white count of 12,400 with ANC of 9200, normal differential.  CRP was slightly increased at 1.5 mg/dL.  ESR was normal at 12 mm/h.  Rheumatoid factor, anti-CCP antibodies were negative.  BCR/ABL were negative.  JAK2 V617F mutation analysis followed by reflex testing to include CALR, MPL, exon 12-15 mutations were all negative.  No evidence of primary myeloproliferative neoplasm. Recently ANA was negative in February 2025.   Patient was provided reassurance that we are not dealing with acute bone marrow process.  Current white blood cell count is 10,400, within normal range. All other blood counts, including red count and platelets, are normal. Previous workup for bone marrow mutations and inflammatory markers showed no concerning findings, with only a slightly elevated CRP. The elevated white count was likely due to inflammation.   Recent weight loss of 30-40 pounds and use of Ozempic  may have contributed to the normalization of the white count.  Since no additional hematological workup or intervention is needed, she can be discharged from our office for continued follow-up with her PCP.  Please reconsult us  as necessary.  - Monitor blood counts every six months or annually with primary care provider. - We plan to re-evaluate if white count exceeds 20,000 or if other blood counts become  abnormal.

## 2024-03-29 ENCOUNTER — Other Ambulatory Visit: Payer: Self-pay | Admitting: Nurse Practitioner

## 2024-03-29 DIAGNOSIS — E1169 Type 2 diabetes mellitus with other specified complication: Secondary | ICD-10-CM

## 2024-04-04 ENCOUNTER — Other Ambulatory Visit (HOSPITAL_COMMUNITY): Payer: Self-pay

## 2024-04-06 ENCOUNTER — Telehealth: Payer: Self-pay | Admitting: Pharmacy Technician

## 2024-04-06 ENCOUNTER — Other Ambulatory Visit (HOSPITAL_COMMUNITY): Payer: Self-pay

## 2024-04-06 NOTE — Telephone Encounter (Signed)
 Pharmacy Patient Advocate Encounter  Received notification from [facility] that Prior Authorization for Ozempic  (1 MG/DOSE) 4MG /3ML pen-injectors has been APPROVED from 04/06/2024 to 04/06/2027.   PA #/Case ID/Reference #: [id number]

## 2024-04-06 NOTE — Telephone Encounter (Signed)
 Pharmacy Patient Advocate Encounter   Received notification from [employer] that prior authorization for Ozempic  (1 MG/DOSE) 4MG /3ML pen-injectors is required/requested.   Insurance verification completed.   The patient is insured through [facility].   Per test claim: PA required; PA submitted to above mentioned insurance via Latent Key/confirmation #/EOC [id number] Status is pending

## 2024-04-09 ENCOUNTER — Other Ambulatory Visit: Payer: Self-pay

## 2024-04-09 MED ORDER — SEMAGLUTIDE(0.25 OR 0.5MG/DOS) 2 MG/3ML ~~LOC~~ SOPN: 0.5000 mg | PEN_INJECTOR | SUBCUTANEOUS | 0 refills | Status: DC

## 2024-05-10 ENCOUNTER — Encounter: Payer: Self-pay | Admitting: Nurse Practitioner

## 2024-05-10 ENCOUNTER — Telehealth: Payer: Self-pay | Admitting: Nurse Practitioner

## 2024-05-10 VITALS — Ht 61.0 in | Wt 153.0 lb

## 2024-05-10 DIAGNOSIS — M7750 Other enthesopathy of unspecified foot: Secondary | ICD-10-CM

## 2024-05-10 DIAGNOSIS — Z7985 Long-term (current) use of injectable non-insulin antidiabetic drugs: Secondary | ICD-10-CM | POA: Diagnosis not present

## 2024-05-10 DIAGNOSIS — E1169 Type 2 diabetes mellitus with other specified complication: Secondary | ICD-10-CM | POA: Diagnosis not present

## 2024-05-10 DIAGNOSIS — F988 Other specified behavioral and emotional disorders with onset usually occurring in childhood and adolescence: Secondary | ICD-10-CM

## 2024-05-10 DIAGNOSIS — R5383 Other fatigue: Secondary | ICD-10-CM

## 2024-05-10 DIAGNOSIS — L509 Urticaria, unspecified: Secondary | ICD-10-CM

## 2024-05-10 DIAGNOSIS — R110 Nausea: Secondary | ICD-10-CM | POA: Diagnosis not present

## 2024-05-10 MED ORDER — SEMAGLUTIDE (2 MG/DOSE) 8 MG/3ML ~~LOC~~ SOPN: 2.0000 mg | PEN_INJECTOR | SUBCUTANEOUS | 1 refills | Status: AC

## 2024-05-10 MED ORDER — LEVOCETIRIZINE DIHYDROCHLORIDE 5 MG PO TABS: 5.0000 mg | ORAL_TABLET | Freq: Two times a day (BID) | ORAL | 3 refills | Status: AC | PRN

## 2024-05-10 MED ORDER — ONDANSETRON HCL 4 MG PO TABS: 4.0000 mg | ORAL_TABLET | Freq: Three times a day (TID) | ORAL | 3 refills | Status: AC | PRN

## 2024-05-10 NOTE — Progress Notes (Unsigned)
 Virtual Visit Encounter {telephone/mychart:25033} visit.   I connected with  Alyssa Bennett on 05/10/2024 at  3:45 PM EST by secure {Video Enabled:26378::video and audio} telemedicine application. I verified that I am speaking with the correct person using two identifiers.   I introduced myself as a Publishing Rights Manager with the practice. The limitations of evaluation and management by telemedicine discussed with the patient and the availability of in person appointments. The patient expressed verbal understanding and consent to proceed.  Participating parties in this visit include: Myself and {Relatives of adult:5061}  The patient is: {Patient Location:[phone]::Home} I am: {Provider Location:[id number]::Home Office} Subjective:    CC and HPI: Alyssa Bennett is a 23 y.o. year old female presenting for {New/followup:15353} ***. Patient reports the following:  Past medical history, Surgical history, Family history not pertinant except as noted below, Social history, Allergies, and medications have been entered into the medical record, reviewed, and corrections made.   Review of Systems:  All review of systems negative except what is listed in the HPI  Objective:    Alert and oriented x *** Speaking in clear sentences with no shortness of breath. No distress.  Impression and Recommendations:    Problem List Items Addressed This Visit   None   {disease follow up plans:315751} I discussed the assessment and treatment plan with the patient. The patient was provided an opportunity to ask questions and all were answered. The patient agreed with the plan and demonstrated an understanding of the instructions.   The patient was advised to call back or seek an in-person evaluation if the symptoms worsen or if the condition fails to improve as anticipated.  Follow-Up: {plan; follow-up:11812}  I provided *** minutes of non-face-to-face interaction with this non face-to-face  encounter including intake, same-day documentation, and chart review.   Camie CHARLENA Doing, NP , DNP, AGNP-c [facility]

## 2024-05-10 NOTE — Patient Instructions (Signed)
 You are doing great!! I hope the fatigue is just the new ADHD dosage and you feel better soon.   Try the ankle brace. It sounds like a tendonopathy/tendonitis that keeps getting aggravated.   I sent the 2mg  Ozempic  in for you. Let me know if this causes any issues.   I will look to see if I can find a [facility] close to you and we can plan to have labs done there for your blood sugars so you can do virtual as often as you need.   Happy Holidays!!

## 2024-05-14 DIAGNOSIS — R5383 Other fatigue: Secondary | ICD-10-CM | POA: Insufficient documentation

## 2024-05-14 NOTE — Assessment & Plan Note (Signed)
 Experiences fatigue, lethargy, and vision changes upon standing since yesterday. Differential includes viral infection or adjustment to increased Adderall dose. Coughing up green sputum once but no further respiratory symptoms. - Advise rest and hydration - Monitor for worsening symptoms such as increased cough or sinus symptoms - Consider writing a note for work if fatigue persists

## 2024-05-14 NOTE — Assessment & Plan Note (Signed)
 Recently increased Adderall dose to 15 mg extended release, resulting in improved focus and task completion. Reports feeling sleepy on medication, possibly contributing to current fatigue. - Continue monitoring the effects of the increased Adderall dose - Evaluate if the fatigue persists and adjust treatment if necessary

## 2024-05-14 NOTE — Assessment & Plan Note (Signed)
 On Ozempic  for blood glucose management with good control. Experiences alternating constipation and diarrhea post-injection. Considering increasing dose from 1 mg to 2 mg due to increased hunger cues, but will finish current 1 mg pen first. - Continue Ozempic  1 mg until the pen is finished, then increase to 2 mg - Monitor for side effects and effectiveness of the increased dose - Consider adjusting the timing of the injection to manage side effects - Order A1c test at a [facility] location Orders:   Semaglutide , 2 MG/DOSE, 8 MG/3ML SOPN; Inject 2 mg as directed once a week.

## 2024-06-15 ENCOUNTER — Other Ambulatory Visit: Payer: Self-pay | Admitting: Internal Medicine
